# Patient Record
Sex: Female | Born: 1945 | Smoking: Former smoker
Health system: Northeastern US, Community
[De-identification: ages and names within clinical notes are randomized; demographics above are authoritative.]

## PROBLEM LIST (undated history)

## (undated) DIAGNOSIS — D649 Anemia, unspecified: Secondary | ICD-10-CM

## (undated) DIAGNOSIS — M949 Disorder of cartilage, unspecified: Secondary | ICD-10-CM

## (undated) DIAGNOSIS — R040 Epistaxis: Secondary | ICD-10-CM

## (undated) DIAGNOSIS — N12 Tubulo-interstitial nephritis, not specified as acute or chronic: Secondary | ICD-10-CM

## (undated) DIAGNOSIS — E039 Hypothyroidism, unspecified: Secondary | ICD-10-CM

## (undated) DIAGNOSIS — M899 Disorder of bone, unspecified: Secondary | ICD-10-CM

## (undated) HISTORY — DX: Disorder of bone, unspecified: M89.9

## (undated) HISTORY — PX: LIG/TRNSXJ FALOPIAN TUBE CESAREAN DEL/ABDML SURG: REP204

## (undated) HISTORY — DX: Epistaxis: R04.0

## (undated) HISTORY — PX: APPENDECTOMY: GID334

## (undated) HISTORY — DX: Disorder of cartilage, unspecified: M94.9

## (undated) HISTORY — DX: Hypothyroidism, unspecified: E03.9

## (undated) HISTORY — DX: Anemia, unspecified: D64.9

## (undated) HISTORY — DX: Tubulo-interstitial nephritis, not specified as acute or chronic: N12

---

## 2000-05-29 ENCOUNTER — Ambulatory Visit: Payer: Self-pay | Admitting: Family Medicine

## 2000-05-29 LAB — COMPLETE BLOOD COUNT
HEMATOCRIT: 33.5 % — ABNORMAL LOW (ref 37.0–47.0)
HEMOGLOBIN: 11.3 g/dL — ABNORMAL LOW (ref 12.0–16.0)
MEAN CORP HGB CONC: 33.9 g/dL (ref 32.0–36.0)
MEAN CORPUSCULAR HGB: 25.7 pg — ABNORMAL LOW (ref 27.0–31.0)
MEAN CORPUSCULAR VOL: 75.8 fL — ABNORMAL LOW (ref 81.0–99.0)
RBC DISTRIBUTION WIDTH: 15 % — ABNORMAL HIGH (ref 11.5–14.3)
RED BLOOD CELL COUNT: 4.41 MIL/uL (ref 4.20–5.40)
WHITE BLOOD CELL COUNT: 7.1 10*3/uL (ref 4.8–10.8)

## 2000-05-29 LAB — RPR: RPR: NONREACTIVE

## 2000-05-29 LAB — IADNA NEISSERIA GONORRHOEAE DIRECT PROBE TQ: GENPROBE GC: NEGATIVE

## 2000-05-29 LAB — CHG CYTP SLIDES CERV/VAG MNL SCRN PHYSICIAN SUPV

## 2000-05-29 LAB — CHOLESTEROL SERUM/WHOLE BLOOD TOTAL: Cholesterol: 178 mg/dl (ref ?–200)

## 2000-05-29 LAB — CHG LIPOPROTEIN DIR MEAS HIGH DENSITY CHOLESTEROL: HIGH DENSITY LIPOPROTEIN: 55 mg/dl (ref 35–95)

## 2000-05-29 LAB — IADNA CHLAMYDIA TRACHOMATIS DIRECT PROBE TQ: GENPROBE CHLAMYDIA: NEGATIVE

## 2000-06-27 ENCOUNTER — Ambulatory Visit: Payer: Self-pay

## 2000-07-08 ENCOUNTER — Ambulatory Visit: Payer: Self-pay | Admitting: Ophthalmology

## 2000-07-11 ENCOUNTER — Ambulatory Visit (HOSPITAL_BASED_OUTPATIENT_CLINIC_OR_DEPARTMENT_OTHER): Payer: Self-pay

## 2000-07-21 ENCOUNTER — Encounter (HOSPITAL_BASED_OUTPATIENT_CLINIC_OR_DEPARTMENT_OTHER): Payer: Self-pay

## 2000-07-21 HISTORY — PX: PR ESOPHAGOSCOPY FLEXIBLE TRANSORAL DIAGNOSTIC: 43200

## 2000-09-22 ENCOUNTER — Encounter (HOSPITAL_BASED_OUTPATIENT_CLINIC_OR_DEPARTMENT_OTHER): Payer: Self-pay

## 2000-09-22 HISTORY — PX: PR COLONOSCOPY STOMA DX INDUDING COLLJ SPEC SPX: 44388

## 2000-09-22 HISTORY — PX: PR COLONOSCOPY STOMA DX INCLUDING COLLJ SPEC SPX: 44388

## 2000-12-02 ENCOUNTER — Other Ambulatory Visit (HOSPITAL_BASED_OUTPATIENT_CLINIC_OR_DEPARTMENT_OTHER): Payer: Self-pay | Admitting: Family Medicine

## 2000-12-02 LAB — MA SCREENING MAMMO BILATERAL WITH CAD

## 2004-08-27 ENCOUNTER — Ambulatory Visit: Payer: Self-pay | Admitting: Ophthalmology

## 2004-08-27 DIAGNOSIS — H5200 Hypermetropia, unspecified eye: Secondary | ICD-10-CM

## 2004-08-27 DIAGNOSIS — H169 Unspecified keratitis: Secondary | ICD-10-CM

## 2004-08-27 DIAGNOSIS — H179 Unspecified corneal scar and opacity: Secondary | ICD-10-CM

## 2004-08-27 DIAGNOSIS — H524 Presbyopia: Secondary | ICD-10-CM

## 2005-05-21 ENCOUNTER — Encounter (HOSPITAL_BASED_OUTPATIENT_CLINIC_OR_DEPARTMENT_OTHER): Payer: Medicaid Other

## 2005-07-09 ENCOUNTER — Encounter (HOSPITAL_BASED_OUTPATIENT_CLINIC_OR_DEPARTMENT_OTHER): Payer: Self-pay

## 2005-07-09 ENCOUNTER — Ambulatory Visit (HOSPITAL_BASED_OUTPATIENT_CLINIC_OR_DEPARTMENT_OTHER): Payer: BC Managed Care – PPO

## 2005-07-09 VITALS — BP 120/72 | HR 64 | Temp 97.7°F | Ht 65.35 in | Wt 125.0 lb

## 2005-07-09 DIAGNOSIS — R040 Epistaxis: Secondary | ICD-10-CM

## 2005-07-09 DIAGNOSIS — Z0000 Encounter for general adult medical examination without abnormal findings: Secondary | ICD-10-CM

## 2005-07-09 DIAGNOSIS — Z1231 Encounter for screening mammogram for malignant neoplasm of breast: Secondary | ICD-10-CM

## 2005-07-09 LAB — BLOOD COUNT COMPLETE AUTO&AUTO DIFRNTL WBC
BASOPHIL %: 0.3 % (ref 0–2)
EOSINOPHIL %: 2.3 % (ref 0–7)
HEMATOCRIT: 39.9 % (ref 37.0–47.0)
HEMOGLOBIN: 13.4 g/dL (ref 12.0–16.0)
LYMPHOCYTE %: 31.7 % (ref 12–39)
MEAN CORP HGB CONC: 33.6 g/dL (ref 32.0–36.0)
MEAN CORPUSCULAR HGB: 28.5 pg (ref 27.0–31.0)
MEAN CORPUSCULAR VOL: 84.9 fL (ref 81.0–99.0)
MEAN PLATELET VOLUME: 8.1 fL (ref 6.4–10.8)
MONOCYTE %: 6.4 % (ref 1–12)
NEUTROPHIL %: 59.3 % (ref 46–79)
PLATELET COUNT: 315 10*3/uL (ref 150–400)
RBC DISTRIBUTION WIDTH: 13.2 % (ref 11.5–14.3)
RED BLOOD CELL COUNT: 4.7 MIL/uL (ref 4.20–5.40)
WHITE BLOOD CELL COUNT: 7.9 10*3/uL (ref 4.8–10.8)

## 2005-07-09 LAB — CHG HEPATIC FUNCTION PANEL
ALANINE AMINOTRANSFERASE: 21 IU/L (ref 7–35)
ALBUMIN: 4.4 g/dl (ref 3.4–4.8)
ALKALINE PHOSPHATASE: 92 IU/L (ref 25–106)
ASPARTATE AMINOTRANSFERASE: 22 IU/L (ref 8–34)
BILIRUBIN DIRECT: 0.1 mg/dl (ref 0.0–0.2)
BILIRUBIN TOTAL: 0.8 mg/dl (ref 0.2–1.1)
TOTAL PROTEIN: 7.7 g/dl — ABNORMAL HIGH (ref 5.9–7.5)

## 2005-07-09 LAB — BASIC METABOLIC PANEL
BUN (UREA NITROGEN): 12 mg/dl (ref 6–20)
CALCIUM: 9.5 mg/dl (ref 8.6–10.0)
CARBON DIOXIDE: 27 mmol/L (ref 22–32)
CHLORIDE: 105 mmol/L (ref 101–111)
CREATININE: 0.8 mg/dl (ref 0.4–1.2)
Glucose Random: 98 mg/dl (ref 74–160)
POTASSIUM: 4 mmol/L (ref 3.5–5.1)
SODIUM: 139 mmol/L (ref 135–144)

## 2005-07-09 LAB — THYROID SCREEN TSH REFLEX FT4: THYROID SCREEN TSH REFLEX FT4: 11.53 u[IU]/mL — ABNORMAL HIGH (ref 0.34–5.60)

## 2005-07-09 LAB — CHG LIPOPROTEIN DIRECT MEASUREMENT LDL CHOLESTEROL: LOW DENSITY LIPOPROTEIN DIRECT: 135 mg/dl — ABNORMAL HIGH (ref 0–100)

## 2005-07-09 LAB — CHG LIPOPROTEIN DIR MEAS HIGH DENSITY CHOLESTEROL: HIGH DENSITY LIPOPROTEIN: 47 mg/dl (ref 35–85)

## 2005-07-09 LAB — CHOLESTEROL SERUM/WHOLE BLOOD TOTAL: Cholesterol: 207 mg/dl — ABNORMAL HIGH (ref 0–200)

## 2005-07-09 LAB — CYTOPATH, C/V, THIN LAYER

## 2005-07-09 LAB — CHG ASSAY OF FREE THYROXINE: FREE THYROXINE: 0.51 ng/dl — ABNORMAL LOW (ref 0.55–1.12)

## 2005-07-09 NOTE — Patient Instructions (Addendum)
PATIENT EDUCATION - Breast Cancer Screening      A little of your time can make a lifetime of difference. Get your preventive health tests.     Breast cancer is the second leading cause of cancer deaths in American women. There is no proven way to not get breast cancer. Along with yearly physical exams, mammography is the best way to find breast cancer and save lives.    Facts you should know about why mammography is so important:    ** 80 % of all breast cancer occurs in women with no family history  ** Age is the most important risk factor for developing breast cancer  ** One time is not enough.   ** Starting at age 58, you need to get a mammogram every year    Please call my office to schedule a mammogram or your first visit. We can also schedule a routine Pap smear for cervical cancer screening. We need to know if you have already scheduled any tests with an OB/GYN. Please tell us so we can update your record.     Remember:  Finding breast cancer early = increased chances for successful treatment.    Remember, early detection greatly increases the chances for successful treatment.    Calcium supplement-see sheet    Baby Aspirin 81 mg a day

## 2005-07-09 NOTE — Progress Notes (Signed)
Beth Hudson is a 59 year old female  There is no problem list on file for this patient.    No current outpatient prescriptions on file.      Allergies not on file.    Past Medical History:   PYELONEPHRITIS NOS    Comment: None for 28 yrs.    Past Surgical History:   LIGATE OVIDUCT(S) ADD-ON    APPENDECTOMY     Review of patient's family history indicates:   Heart Mother    Hypertension Mother    Renal Mother    Comment: died 2, died of renal failure   Stroke Sister    Comment: age 76, now 57   Stroke Father    Comment: died age 69      Social History   Marital Status: Unknown Spouse Name:    Years of Education: Number of children:     Occupational History   None on file    Social History Main Topics   Tobacco Use: Never    Alcohol Use: No    Drug Use: No    Sexual Activity: Not Currently    Other Topics Concern   None on file    Social History Narrative   Live with ex-husband with end stage Lung Dz. And son age 80. She has 3 daughters ages 40,34,32. She works at [employer].      History of Present illness:  Patient here for Check up, last exam was 2001 incl. MAM, PAP!!  She's here at her daughters minsistence, admits she doesn't like doctors. Her only concerns are the right sided nosebleeds for the past 3 weeks, blood clots at times. She was told before that she had sinus problems. She denies nasal or head congestion, poor smell, nasal purulent drainage, allergies. No gum bleeding, easy bruising, anorexia, wt. Loss. No hematuria, melena, rectal bleeding.    ROS:  General- No change in weight or appetite, fever.  Eyes/ear/throat- No visual problems, hx. Of double vision, earache, hearing problem, allergies, sore throat.  Lungs- No cough, SOB, wheezing.   Heart- No CP, palpitations, orthopnea, PND.  Abdomen- No pain, nausea, heartburn, constipation, diarrheas, rectal bleeding.  Genitourinary- No dysuria, urinary frequency, hematuria, incontinence.  Neuro- No weakness or numbness of extremities, headaches,  difficulty speaking.  Musculoskeletal- No joint pains, swelling, back pain.  Skin- No lesions, rashes.  Psychiatric- No depression, anxiety sxs., difficulty sleeping.  Gynecological- Menopausal; no PMB, vaginal dryness or discharge. Not sexually active since 1997.    BP 120/72  Pulse 64  Temp (Src) 97.7 (Oral)  Ht 5' 5.35" (1.66m)  Wt 125 lbs (56.7kg)  Appearance: in no apparent distress and well developed and well nourished.  HEENT- Clear conjunctiva and sclera. PERRLA. Ears- Normal ext. Canal with small bilat. Serous effussions. Throat- Unremarkable  The neck is supple and free of adenopathy or masses, the thyroid is normal without enlargement or nodules.  S1 and S2 normal, no murmurs, clicks, gallops or rubs. Regular rate and rhythm. Chest is clear; no wheezes or rales. No edema of the legs.  The abdomen is soft without tenderness, guarding, mass, rebound or organomegaly. Bowel sounds are normal. No CVA tenderness or inguinal adenopathy noted.  GYN- External genitalia- normal.   Vagina- No lesions, discharge seen. Atrophic mucosa.   Cervix- Posterior. No lesions seen; no CMT.   Bimanual- No tenderness, masses felt.  Rectum normal without masses, negative guaiac.  Neuro: Gait is normal. Good strength in all extremities with normal muscle tone.  Cranial nerves 2- 12 intact.        V70.0 ROUTINE MEDICAL EXAM (primary encounter diagnosis)  Will also recommend Bone density.    V76.12 SCREENING MAMM-MAILG NEOPL NEC  Mam to be booked today.  Plan: ROUTINE VENIPUNCTURE, BASIC METABOLIC PANEL,    ASSAY, BLD/SERUM CHOLESTEROL, HIGH DENSITY    LIPOPROTEIN (HDL), LOW DENSITY LIPOPROTEIN    (LDL), COMPLETE CBC W/AUTO DIFF WBC, THYROID    SCREEN TSH, HEPATIC FUNCTION PANEL       784.7 EPISTAXIS  Will refer for ? Endoscopic eval./ENT, ? Nasal Polyp, other lesions.  Plan: REFERRAL TO ENT (INT)

## 2005-07-12 ENCOUNTER — Telehealth (HOSPITAL_BASED_OUTPATIENT_CLINIC_OR_DEPARTMENT_OTHER): Payer: Self-pay

## 2005-07-12 DIAGNOSIS — E039 Hypothyroidism, unspecified: Secondary | ICD-10-CM

## 2005-07-12 MED ORDER — LEVOXYL 50 MCG PO TABS
ORAL_TABLET | ORAL | Status: DC
Start: 2005-07-12 — End: 2005-10-07

## 2005-07-12 NOTE — Telephone Encounter (Signed)
I called the patient at home and left a message with her husband to call back. She has an elevated TSH, 11.53, which is compatible with hypothyroidism. Will await her call back. A rx. For Levoxyl .05mg  a day will be given , and, when she calls back it can be faxed over to the pharmacy of her choice. She should have repeat lab, thyroid testing in 2 months and F/u Appt.afterwards with me.  mitral regurgitation

## 2005-07-15 ENCOUNTER — Other Ambulatory Visit (HOSPITAL_BASED_OUTPATIENT_CLINIC_OR_DEPARTMENT_OTHER): Payer: Self-pay

## 2005-07-15 DIAGNOSIS — Z1231 Encounter for screening mammogram for malignant neoplasm of breast: Secondary | ICD-10-CM

## 2005-07-22 ENCOUNTER — Other Ambulatory Visit (HOSPITAL_BASED_OUTPATIENT_CLINIC_OR_DEPARTMENT_OTHER): Payer: Self-pay | Admitting: Rheumatology

## 2005-07-22 DIAGNOSIS — M949 Disorder of cartilage, unspecified: Secondary | ICD-10-CM

## 2005-07-22 DIAGNOSIS — M899 Disorder of bone, unspecified: Secondary | ICD-10-CM

## 2005-07-22 DIAGNOSIS — N951 Menopausal and female climacteric states: Secondary | ICD-10-CM

## 2005-07-25 NOTE — Telephone Encounter (Signed)
T.C. To pt and informed her of the lab results. Also called in script.

## 2005-07-29 LAB — LS&HIP BONE DENSITOMETRY SCAN

## 2005-07-31 ENCOUNTER — Encounter (HOSPITAL_BASED_OUTPATIENT_CLINIC_OR_DEPARTMENT_OTHER): Payer: Self-pay

## 2005-08-05 LAB — MA SCREENING MAMMO BILATERAL WITH CAD

## 2005-08-09 ENCOUNTER — Other Ambulatory Visit (HOSPITAL_BASED_OUTPATIENT_CLINIC_OR_DEPARTMENT_OTHER): Payer: BC Managed Care – PPO | Admitting: Otolaryngology

## 2005-08-09 DIAGNOSIS — J342 Deviated nasal septum: Secondary | ICD-10-CM

## 2005-08-09 DIAGNOSIS — R040 Epistaxis: Secondary | ICD-10-CM

## 2005-08-15 ENCOUNTER — Emergency Department (HOSPITAL_BASED_OUTPATIENT_CLINIC_OR_DEPARTMENT_OTHER): Payer: Self-pay | Admitting: Emergency Medicine

## 2005-08-19 ENCOUNTER — Ambulatory Visit (HOSPITAL_BASED_OUTPATIENT_CLINIC_OR_DEPARTMENT_OTHER): Payer: BC Managed Care – PPO | Admitting: Otolaryngology

## 2005-08-19 DIAGNOSIS — J342 Deviated nasal septum: Secondary | ICD-10-CM

## 2005-08-19 DIAGNOSIS — R040 Epistaxis: Secondary | ICD-10-CM

## 2005-09-17 ENCOUNTER — Ambulatory Visit (HOSPITAL_BASED_OUTPATIENT_CLINIC_OR_DEPARTMENT_OTHER): Payer: BC Managed Care – PPO

## 2005-09-17 VITALS — BP 100/60 | Temp 97.9°F | Ht 65.0 in | Wt 124.0 lb

## 2005-09-17 DIAGNOSIS — M899 Disorder of bone, unspecified: Secondary | ICD-10-CM | POA: Insufficient documentation

## 2005-09-17 DIAGNOSIS — M949 Disorder of cartilage, unspecified: Secondary | ICD-10-CM

## 2005-09-17 DIAGNOSIS — G5600 Carpal tunnel syndrome, unspecified upper limb: Secondary | ICD-10-CM

## 2005-09-17 DIAGNOSIS — R040 Epistaxis: Secondary | ICD-10-CM | POA: Insufficient documentation

## 2005-09-17 DIAGNOSIS — E039 Hypothyroidism, unspecified: Secondary | ICD-10-CM

## 2005-09-17 DIAGNOSIS — D649 Anemia, unspecified: Secondary | ICD-10-CM

## 2005-09-17 HISTORY — DX: Epistaxis: R04.0

## 2005-09-17 HISTORY — DX: Disorder of bone, unspecified: M89.9

## 2005-09-17 MED ORDER — CALTRATE 600 + D 600-200 MG-IU OR TABS
ORAL_TABLET | ORAL | Status: DC
Start: 2005-09-17 — End: 2007-08-10

## 2005-09-17 MED ORDER — FOSAMAX 70 MG PO TABS
ORAL_TABLET | ORAL | Status: DC
Start: 2005-09-17 — End: 2006-07-09

## 2005-09-17 MED ORDER — FERROUS SULFATE 325 MG OR CAPS
ORAL_CAPSULE | ORAL | Status: DC
Start: 2005-09-17 — End: 2006-04-07

## 2005-09-17 NOTE — Progress Notes (Signed)
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 Was seen three times in the er for Recurrent Nosebleeds, has had cauterization done x 3 , last time at [facility] with resolution of sxs.Marland Kitchen No prior similar sxs.; also told she had anemia, rec. Iron tx..  No hx. Gum bleeding, easy bruising, fatigue, CP, SOB.  She also recently has noted left forearm and hand numbness, paresthesias; her hand has awakened asleep in the AM. No hx. Neck pain, similar sxs. In other extremities.  She's been taking Levoxyl without any problems.  Bone Density Results from 08/06 have been reviewed    BP 100/60  Temp (Src) 97.9 (Oral)  Ht 5\' 5"  (1.50m)  Wt 124 lbs (56.2kg)  Appearance: in no apparent distress and well developed, thin female  The neck is supple and free of adenopathy or masses, the thyroid is normal without enlargement or nodules.  S1 and S2 normal, no murmurs, clicks, gallops or rubs. Regular rate and rhythm. Chest is clear; no wheezes or rales. No edema of legs.  Exam reveals positive Phalen and Tinel sign on left wrist. There is no muscle atrophy noted.  Neuro: Gait is normal. Good strength in all extremities with normal muscle tone.    244.9 HYPOTHYROIDISM NOS (primary encounter diagnosis)  Recheck TSH to determine further meds adjustment.  Plan: ROUTINE VENIPUNCTURE, THYROID STIM HORMONE     285.9 ANEMIA NOS  Re-check and f/u.  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 This drug must be taken upon arising for the day on an empty stomach, with a large 6-8 ounce glass of water; she must remain NPO in the upright position for at least 30 minutes afterwards and until after the first food of the day. If esophageal irritation is noted, she will stop the drug and call my office.    Plan: FOSAMAX 70 MG OR TABS, CALTRATE 600 + D 600-200   MG-IU OR TABS       784.7 EPISTAXIS  She has ENT f/u, reccomended Humidifier use, Ocean nasal spray.    354.0 CARPAL TUNNEL SYNDROME  Use of splint at bedtime reccomended.      09/17/2005  VIS given prior to administration and reviewed with the patient and or legal guardian. Patient understands the disease and the vaccine. See immunization/Injection module or chart review for date of publication and additional information.  Elson Clan, MD

## 2005-09-22 ENCOUNTER — Telehealth (HOSPITAL_BASED_OUTPATIENT_CLINIC_OR_DEPARTMENT_OTHER): Payer: Self-pay

## 2005-09-22 DIAGNOSIS — E039 Hypothyroidism, unspecified: Secondary | ICD-10-CM

## 2005-09-22 DIAGNOSIS — D649 Anemia, unspecified: Secondary | ICD-10-CM

## 2005-09-22 DIAGNOSIS — G5600 Carpal tunnel syndrome, unspecified upper limb: Secondary | ICD-10-CM | POA: Insufficient documentation

## 2005-09-22 HISTORY — DX: Hypothyroidism, unspecified: E03.9

## 2005-09-22 HISTORY — DX: Anemia, unspecified: D64.9

## 2005-09-22 LAB — BLOOD COUNT COMPLETE AUTOMATED
HEMATOCRIT: 24.7 % — ABNORMAL LOW (ref 37.0–47.0)
HEMOGLOBIN: 8.1 g/dL — CL (ref 12.0–16.0)
MEAN CORP HGB CONC: 32.9 g/dL (ref 32.0–36.0)
MEAN CORPUSCULAR HGB: 26.2 pg — ABNORMAL LOW (ref 27.0–31.0)
MEAN CORPUSCULAR VOL: 79.8 fL — ABNORMAL LOW (ref 81.0–99.0)
MEAN PLATELET VOLUME: 7.4 fL (ref 6.4–10.8)
PLATELET COUNT: 491 10*3/uL — ABNORMAL HIGH (ref 150–400)
RBC DISTRIBUTION WIDTH: 14.2 % (ref 11.5–14.3)
RED BLOOD CELL COUNT: 3.1 MIL/uL — ABNORMAL LOW (ref 4.20–5.40)
WHITE BLOOD CELL COUNT: 6.3 10*3/uL (ref 4.8–10.8)

## 2005-09-22 LAB — IRON: IRON: 129 ug/dl (ref 28–170)

## 2005-09-22 LAB — TSH (THYROID STIMULATING HORMONE): TSH (THYROID STIM HORMONE): 4.4 u[IU]/mL (ref 0.34–5.60)

## 2005-09-22 NOTE — Telephone Encounter (Signed)
I called the patient at home to let her know, regarding severe anemia as per last lab done on 09/17/05. I left a message stating that she needed to return to the office tomorrow AM for repeat lab.. I also called the patient's next of kin, her daughter Leandro Reasoner and again reinforced that she needs to return on Monday 09/23/05 for repeat lab testing.  Elson Clan, MD

## 2005-09-23 ENCOUNTER — Other Ambulatory Visit (HOSPITAL_BASED_OUTPATIENT_CLINIC_OR_DEPARTMENT_OTHER): Payer: BLUE CROSS/BLUE SHIELD | Admitting: Lab

## 2005-09-23 DIAGNOSIS — D649 Anemia, unspecified: Secondary | ICD-10-CM

## 2005-09-23 LAB — BLOOD COUNT COMPLETE AUTOMATED
HEMATOCRIT: 23.2 % — ABNORMAL LOW (ref 37.0–47.0)
HEMOGLOBIN: 7.5 g/dL — CL (ref 12.0–16.0)
MEAN CORP HGB CONC: 32.3 g/dL (ref 32.0–36.0)
MEAN CORPUSCULAR HGB: 25.3 pg — ABNORMAL LOW (ref 27.0–31.0)
MEAN CORPUSCULAR VOL: 78.4 fL — ABNORMAL LOW (ref 81.0–99.0)
MEAN PLATELET VOLUME: 7.8 fL (ref 6.4–10.8)
PLATELET COUNT: 408 10*3/uL — ABNORMAL HIGH (ref 150–400)
RBC DISTRIBUTION WIDTH: 14.9 % — ABNORMAL HIGH (ref 11.5–14.3)
RED BLOOD CELL COUNT: 2.96 MIL/uL — CL (ref 4.20–5.40)
WHITE BLOOD CELL COUNT: 5.4 10*3/uL (ref 4.8–10.8)

## 2005-09-23 NOTE — Nursing Note (Signed)
>>   Beth Hudson, Beth Hudson     09/23/2005   9:37 am  Labs completed

## 2005-09-24 ENCOUNTER — Telehealth (HOSPITAL_BASED_OUTPATIENT_CLINIC_OR_DEPARTMENT_OTHER): Payer: Self-pay

## 2005-09-24 NOTE — Telephone Encounter (Signed)
I called the patent's houseand spoke with her husband. He referred that she was in [place], didn't know the tel# she's at. He gave me another's daughter's tel. #, [phone], [phone]. I called there and left a message to call back. He f/u Hct is 23.4%, she needs to receive a blood transfusion.  Elson Clan, MD

## 2005-09-25 ENCOUNTER — Encounter (HOSPITAL_BASED_OUTPATIENT_CLINIC_OR_DEPARTMENT_OTHER): Payer: Self-pay | Admitting: Registered Nurse

## 2005-09-25 ENCOUNTER — Encounter (HOSPITAL_BASED_OUTPATIENT_CLINIC_OR_DEPARTMENT_OTHER): Payer: BLUE CROSS/BLUE SHIELD | Admitting: Infusion

## 2005-09-25 DIAGNOSIS — D6489 Other specified anemias: Secondary | ICD-10-CM

## 2005-09-25 LAB — PATIENT ABO/RH CONFIRM

## 2005-09-25 LAB — COMPATIBILITY TEST, IS/GEL - NO LONGER IN USE

## 2005-09-25 LAB — PACKED RED BLOOD CELLS

## 2005-09-25 LAB — TYPE AND SCREEN

## 2005-09-25 NOTE — Progress Notes (Signed)
Pt present to clinic for 2 units of PRBC's secondary to hct 23.2 by Dr. Delora Fuel. Type and screened. Consented by Dr. Delora Fuel (written consent in chart). VSS - see chart for transfusion form documentation. Post-transfusion information given to patient - verbal and written. F/U with Dr. Delora Fuel 09/26/05 for repeat (post H&H). Pt aware. Tolerated transfusion without reaction.

## 2005-09-25 NOTE — Telephone Encounter (Signed)
I was able to communicate with her daughter Leandro Reasoner. Blood transfusion has been arranged for tomorrow at [facility], patient to f/u with me in 1 week.  Elson Clan, MD  .

## 2005-09-26 ENCOUNTER — Other Ambulatory Visit (HOSPITAL_BASED_OUTPATIENT_CLINIC_OR_DEPARTMENT_OTHER): Payer: BLUE CROSS/BLUE SHIELD | Admitting: Lab

## 2005-09-26 DIAGNOSIS — E039 Hypothyroidism, unspecified: Secondary | ICD-10-CM

## 2005-09-26 DIAGNOSIS — D649 Anemia, unspecified: Secondary | ICD-10-CM

## 2005-09-26 LAB — CHG ASSAY OF FREE THYROXINE: FREE THYROXINE: 0.74 ng/dl (ref 0.55–1.12)

## 2005-09-26 LAB — BLOOD COUNT COMPLETE AUTO&AUTO DIFRNTL WBC
BASOPHIL %: 0.4 % (ref 0–2)
EOSINOPHIL %: 2 % (ref 0–7)
HEMATOCRIT: 32.4 % — ABNORMAL LOW (ref 37.0–47.0)
HEMOGLOBIN: 11 g/dL — ABNORMAL LOW (ref 12.0–16.0)
LYMPHOCYTE %: 31.7 % (ref 12–39)
MEAN CORP HGB CONC: 33.9 g/dL (ref 32.0–36.0)
MEAN CORPUSCULAR HGB: 26.9 pg — ABNORMAL LOW (ref 27.0–31.0)
MEAN CORPUSCULAR VOL: 79.2 fL — ABNORMAL LOW (ref 81.0–99.0)
MEAN PLATELET VOLUME: 7.9 fL (ref 6.4–10.8)
MONOCYTE %: 7.2 % (ref 1–12)
NEUTROPHIL %: 58.7 % (ref 46–79)
PLATELET COUNT: 389 10*3/uL (ref 150–400)
RBC DISTRIBUTION WIDTH: 15.4 % — ABNORMAL HIGH (ref 11.5–14.3)
RED BLOOD CELL COUNT: 4.09 MIL/uL — ABNORMAL LOW (ref 4.20–5.40)
WHITE BLOOD CELL COUNT: 6.1 10*3/uL (ref 4.8–10.8)

## 2005-09-26 LAB — THYROID SCREEN TSH REFLEX FT4: THYROID SCREEN TSH REFLEX FT4: 6.77 u[IU]/mL — ABNORMAL HIGH (ref 0.34–5.60)

## 2005-09-26 NOTE — Nursing Note (Signed)
>>   Beth Hudson     09/26/2005   11:26 am  Lab completed

## 2005-10-07 ENCOUNTER — Ambulatory Visit (HOSPITAL_BASED_OUTPATIENT_CLINIC_OR_DEPARTMENT_OTHER): Payer: BLUE CROSS/BLUE SHIELD

## 2005-10-07 VITALS — BP 100/60 | Temp 98.5°F | Wt 121.0 lb

## 2005-10-07 DIAGNOSIS — E039 Hypothyroidism, unspecified: Secondary | ICD-10-CM

## 2005-10-07 DIAGNOSIS — D649 Anemia, unspecified: Secondary | ICD-10-CM

## 2005-10-07 LAB — BLOOD COUNT COMPLETE AUTOMATED
HEMATOCRIT: 34.6 % — ABNORMAL LOW (ref 37.0–47.0)
HEMOGLOBIN: 11.5 g/dL — ABNORMAL LOW (ref 12.0–16.0)
MEAN CORP HGB CONC: 33.3 g/dL (ref 32.0–36.0)
MEAN CORPUSCULAR HGB: 26.6 pg — ABNORMAL LOW (ref 27.0–31.0)
MEAN CORPUSCULAR VOL: 79.8 fL — ABNORMAL LOW (ref 81.0–99.0)
MEAN PLATELET VOLUME: 8.6 fL (ref 6.4–10.8)
PLATELET COUNT: 278 10*3/uL (ref 150–400)
RBC DISTRIBUTION WIDTH: 17.2 % — ABNORMAL HIGH (ref 11.5–14.3)
RED BLOOD CELL COUNT: 4.33 MIL/uL (ref 4.20–5.40)
WHITE BLOOD CELL COUNT: 6.4 10*3/uL (ref 4.8–10.8)

## 2005-10-07 MED ORDER — LEVOXYL 75 MCG PO TABS
ORAL_TABLET | ORAL | Status: DC
Start: 2005-10-07 — End: 2005-12-05

## 2005-10-08 NOTE — Progress Notes (Signed)
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    Patient here for f/u. Aprox. 2 weeks ago received 2 Units PRBC's due to severe anemia 2 ry to Severe Recurrent Epistaxis. She was re-eval. By ENT, apparently all ok but no notes available for review. She has not had any further nosebleeds. She's pending stool guaiac check; had Colo around 5 years ago and apparently all wnl. No easy bruising, gum bleeds, hematuria, melena( although now stools are dark with iron tx.) She feels good; denies fatigue. Taking all meds as rx. Without any side effects.    BP 100/60  Temp (Src) 98.5 (Oral)  Wt 121 lbs (54.9kg)  Appearance: in no apparent distress and well developed and well nourished.  S1 and S2 normal, no murmurs, clicks, gallops or rubs. Regular rate and rhythm. Chest is clear; no wheezes or rales.     Labs:   Component Reference Range 09/26/2005  WHITE BLOOD CELL AUTO 4.8-10.8 TH/uL 6.1  RED BLOOD CELL COUNT AUTO 4.20-5.40 MIL/uL 4.09 (L)  HEMOGLOBIN AUTO 12.0-16.0 g/dL 16.1 (L)  HEMATOCRIT AUTO 37.0-47.0 % 32.4 (L)  MEAN CORPUSCULAR VOL AUTO 81.0-99.0 fL 79.2 (L)  MEAN CORPUSCULAR HGB AUTO 27.0-31.0 pg 26.9 (L)  MEAN CORP HGB CONC AUTO 32.0-36.0 g/dL 09.6  RBC DISTRIBUTION WIDTH 11.5-14.3 % 15.4 (H)  PLATELET COUNT AUTO 150-400 TH/MM3 389  MEAN PLATELET VOLUME AUTO 6.4-10.8 fL 7.9  NEUTROPHIL % 46-79 % 58.7  LYMPHOCYTE % 12-39 % 31.7  MONOCYTE % 1-12 %  7.2  EOSINOPHIL % 0-7 % 2.0  BASOPHIL % 0-2 % 0.4  THYROID SCREEN TSH 0.34-5.60 uIU/ml 6.77 (H)    285.9 ANEMIA NOS (primary encounter diagnosis)  Recheck today and in 1 month; cont. Iron supplements.  Plan: ROUTINE VENIPUNCTURE, COMPLETE CBC, AUTOMATED,    ROUTINE VENIPUNCTURE, COMPLETE CBC, AUTOMATED       244.9 HYPOTHYROIDISM NOS  Increase Levoxyl dose and f/u.  Plan: LEVOXYL 75 MCG OR TABS, THYROID STIM HORMONE,    ROUTINE VENIPUNCTURE

## 2005-11-06 ENCOUNTER — Other Ambulatory Visit (HOSPITAL_BASED_OUTPATIENT_CLINIC_OR_DEPARTMENT_OTHER): Payer: BLUE CROSS/BLUE SHIELD | Admitting: Lab

## 2005-11-06 DIAGNOSIS — D649 Anemia, unspecified: Secondary | ICD-10-CM

## 2005-11-06 DIAGNOSIS — E039 Hypothyroidism, unspecified: Secondary | ICD-10-CM

## 2005-11-06 LAB — BLOOD COUNT COMPLETE AUTOMATED
HEMATOCRIT: 39.5 % (ref 37.0–47.0)
HEMOGLOBIN: 12.7 g/dL (ref 12.0–16.0)
MEAN CORP HGB CONC: 32.2 g/dL (ref 32.0–36.0)
MEAN CORPUSCULAR HGB: 25.6 pg — ABNORMAL LOW (ref 27.0–31.0)
MEAN CORPUSCULAR VOL: 79.6 fL — ABNORMAL LOW (ref 81.0–99.0)
MEAN PLATELET VOLUME: 8.2 fL (ref 6.4–10.8)
PLATELET COUNT: 279 10*3/uL (ref 150–400)
RBC DISTRIBUTION WIDTH: 18.7 % — ABNORMAL HIGH (ref 11.5–14.3)
RED BLOOD CELL COUNT: 4.97 MIL/uL (ref 4.20–5.40)
WHITE BLOOD CELL COUNT: 5.7 10*3/uL (ref 4.8–10.8)

## 2005-11-06 LAB — TSH (THYROID STIMULATING HORMONE): TSH (THYROID STIM HORMONE): 5.1 u[IU]/mL (ref 0.34–5.60)

## 2005-11-06 NOTE — Nursing Note (Signed)
>>   RAIS, LISA     11/06/2005   4:10 pm  Lab completed  lr

## 2005-11-14 ENCOUNTER — Encounter (HOSPITAL_BASED_OUTPATIENT_CLINIC_OR_DEPARTMENT_OTHER): Payer: Self-pay

## 2005-12-05 ENCOUNTER — Ambulatory Visit (HOSPITAL_BASED_OUTPATIENT_CLINIC_OR_DEPARTMENT_OTHER): Payer: BLUE CROSS/BLUE SHIELD

## 2005-12-05 VITALS — BP 120/60 | Temp 97.9°F | Wt 120.0 lb

## 2005-12-05 DIAGNOSIS — D509 Iron deficiency anemia, unspecified: Secondary | ICD-10-CM

## 2005-12-05 DIAGNOSIS — H538 Other visual disturbances: Secondary | ICD-10-CM

## 2005-12-05 DIAGNOSIS — E039 Hypothyroidism, unspecified: Secondary | ICD-10-CM

## 2005-12-05 MED ORDER — LEVOXYL 100 MCG PO TABS
ORAL_TABLET | ORAL | Status: DC
Start: 2005-12-05 — End: 2006-04-08

## 2005-12-09 NOTE — Progress Notes (Signed)
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 [name]; denies fatigue. No further nosebleeds. She's cont. Taking iron pills as rx.Beth Hudson Fosamax without any side effects.    Appearance: in no apparent distress and well developed and well nourished.  The neck is supple and free of adenopathy or masses, the thyroid is normal without enlargement or nodules.  S1 and S2 normal, no murmurs, clicks, gallops or rubs. Regular rate and rhythm. Chest is clear; no wheezes or rales. No edema      Component Reference Range 11/06/2005  WHITE BLOOD CELL AUTO 4.8-10.8 TH/uL 5.7  RED BLOOD CELL COUNT AUTO 4.20-5.40 MIL/uL 4.97  HEMOGLOBIN AUTO 12.0-16.0 g/dL 16.1  HEMATOCRIT AUTO 09.6-04.5 % 39.5  MEAN CORPUSCULAR VOL AUTO 81.0-99.0 fL 79.6 (L)  MEAN CORPUSCULAR HGB AUTO 27.0-31.0 pg 25.6 (L)  MEAN CORP HGB CONC AUTO 32.0-36.0 g/dL 40.9  RBC DISTRIBUTION WIDTH 11.5-14.3 % 18.7 (H)  PLATELET COUNT AUTO 150-400 TH/MM3 279  MEAN PLATELET VOLUME AUTO 6.4-10.8 fL 8.2  THYROID STIM HORMONE 0.34-5.60 uIU/ml 5.10    244.9 HYPOTHYROIDISM NOS (primary encounter diagnosis)  I will increase Levoxyl dose to full replacement dose and re-check; TSH goal between 2-3.    368.8 VISUAL DISTURBANCES NEC  Referral for eye check, routine.  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 Iron tx.Marland Kitchen

## 2005-12-13 ENCOUNTER — Other Ambulatory Visit (HOSPITAL_BASED_OUTPATIENT_CLINIC_OR_DEPARTMENT_OTHER): Payer: BLUE CROSS/BLUE SHIELD | Admitting: Ophthalmology

## 2005-12-13 DIAGNOSIS — H524 Presbyopia: Secondary | ICD-10-CM

## 2005-12-13 DIAGNOSIS — H5200 Hypermetropia, unspecified eye: Secondary | ICD-10-CM

## 2006-01-07 ENCOUNTER — Other Ambulatory Visit (HOSPITAL_BASED_OUTPATIENT_CLINIC_OR_DEPARTMENT_OTHER): Payer: BLUE CROSS/BLUE SHIELD | Admitting: Lab

## 2006-01-07 DIAGNOSIS — D649 Anemia, unspecified: Secondary | ICD-10-CM

## 2006-01-07 DIAGNOSIS — E039 Hypothyroidism, unspecified: Secondary | ICD-10-CM

## 2006-01-07 LAB — BLOOD COUNT COMPLETE AUTO&AUTO DIFRNTL WBC
BASOPHIL %: 0.5 % (ref 0–2)
EOSINOPHIL %: 2.5 % (ref 0–7)
HEMATOCRIT: 38.5 % (ref 37.0–47.0)
HEMOGLOBIN: 13 g/dL (ref 12.0–16.0)
LYMPHOCYTE %: 26.4 % (ref 12–39)
MEAN CORP HGB CONC: 33.9 g/dL (ref 32.0–36.0)
MEAN CORPUSCULAR HGB: 27.5 pg (ref 27.0–31.0)
MEAN CORPUSCULAR VOL: 81 fL (ref 81.0–99.0)
MEAN PLATELET VOLUME: 8.3 fL (ref 6.4–10.8)
MONOCYTE %: 7.1 % (ref 1–12)
NEUTROPHIL %: 63.5 % (ref 46–79)
PLATELET COUNT: 301 10*3/uL (ref 150–400)
RBC DISTRIBUTION WIDTH: 16.8 % — ABNORMAL HIGH (ref 11.5–14.3)
RED BLOOD CELL COUNT: 4.75 MIL/uL (ref 4.20–5.40)
WHITE BLOOD CELL COUNT: 5.9 10*3/uL (ref 4.8–10.8)

## 2006-01-07 LAB — TSH (THYROID STIMULATING HORMONE): TSH (THYROID STIM HORMONE): 7.73 u[IU]/mL — ABNORMAL HIGH (ref 0.34–5.60)

## 2006-01-07 NOTE — Nursing Note (Signed)
>>   Beth Hudson     01/07/2006   9:39 am  Labs drawn.

## 2006-01-14 ENCOUNTER — Encounter (HOSPITAL_BASED_OUTPATIENT_CLINIC_OR_DEPARTMENT_OTHER): Payer: Self-pay

## 2006-01-14 ENCOUNTER — Other Ambulatory Visit (HOSPITAL_BASED_OUTPATIENT_CLINIC_OR_DEPARTMENT_OTHER): Payer: Self-pay

## 2006-01-14 DIAGNOSIS — E039 Hypothyroidism, unspecified: Secondary | ICD-10-CM

## 2006-01-14 MED ORDER — LEVOXYL 112 MCG PO TABS
ORAL_TABLET | ORAL | Status: DC
Start: 2006-01-14 — End: 2006-04-08

## 2006-02-16 ENCOUNTER — Emergency Department: Payer: Self-pay | Admitting: Emergency Medicine

## 2006-02-17 ENCOUNTER — Telehealth (HOSPITAL_BASED_OUTPATIENT_CLINIC_OR_DEPARTMENT_OTHER): Payer: Self-pay | Admitting: Registered Nurse

## 2006-02-17 ENCOUNTER — Ambulatory Visit (HOSPITAL_BASED_OUTPATIENT_CLINIC_OR_DEPARTMENT_OTHER): Payer: BLUE CROSS/BLUE SHIELD

## 2006-02-17 VITALS — BP 120/60 | HR 62 | Temp 98.5°F

## 2006-02-17 DIAGNOSIS — B0229 Other postherpetic nervous system involvement: Secondary | ICD-10-CM

## 2006-02-17 DIAGNOSIS — B029 Zoster without complications: Secondary | ICD-10-CM

## 2006-02-17 NOTE — Progress Notes (Signed)
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]    Current outpatient prescriptions:  CENTRUM SILVER OR TABS, 1 TABLET DAILY, Disp: 30, Rfl: 0  FERROUS SULFATE 325 MG OR CAPS, 1 TABLET twice DAILY, Disp: 100, Rfl: 5  FOSAMAX 70 MG OR TABS, 1 TABLET WEEKLY ON AN EMPTY STOMACH, Disp: 4, Rfl: 11  CALTRATE 600 + D 600-200 MG-IU OR TABS, 1 TABLET twice daily, Disp: 60, Rfl: 11  ZOVIRAX 800 MG OR TABS, 1 TABLET 5 TIMES DAILY- per ER, Disp: 50, Rfl: 0  OXYCODONE-ACETAMINOPHEN 5-300 MG OR TABS, 1 TABLET EVERY 6 HOURS AS NEEDED- per ER, Disp: 56, Rfl: 0  LEVOXYL 112 MCG OR TABS, 1 TABLET DAILY, Disp: 30, Rfl: 6    Patient For 5 days with rash in her back, seen in ER last night due to severe pain since the night before. She was dx. With shingles, no prior similar sxs.. She's had pain relief with med rx.. This AM noted lump in left rib cage area very tender. No fever, CP, SOB, pleuresy.    BP 120/60  Pulse 62  Temp (Src) 98.5 (Oral)  SaO2 100%  Appearance: in no apparent distress and well developed and well nourished.  Skin- In left back, ? T 6 dermatome with patchy papulo- vesicular rash with erythema, in clusters; there's a small area in left ant. Chest, similar appearance within same dermatome distribution. Left back and ribs with increased sensitivity to light touch but no masses, edema present.  S1 and S2 normal, no murmurs, clicks, gallops or rubs. Regular rate and rhythm. Chest is clear; no wheezes or rales.    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 Related to Zoster virus reactivation. If needed will call for pain med refills.  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

## 2006-02-17 NOTE — Telephone Encounter (Signed)
T.C. To pt. She states that she developed shingles over the w/e. Went to the ER. This AM she woke up with a lump near her left breast. Painful to touch. Add-on for Dr. Delora Fuel today.

## 2006-02-17 NOTE — Telephone Encounter (Signed)
Staff Message copied by Barbarann Ehlers on 02/17/2006 at 9:07 AM  ------   Message from: Jeanella Cara   Created: 02/17/2006 at 8:48 AM   Contact: [phone]    Beth Hudson [id number], 60 year old, female, [phone] (home) [phone] (work)    Cleotis Lema NUMBER: [phone]  Cell phone:   Other phone:    Available times:    Patient's language of care: English    Patient does not need an interpreter.    Patient's PCP: Elson Clan, MD, MD    Person calling on behalf of patient: patient (self)    Calls today with a sick call. Patient has shingles, woke up with lump on left side of ribs right behind of breast. Would like for Dr Delora Fuel to call. Thank you

## 2006-03-05 ENCOUNTER — Other Ambulatory Visit (HOSPITAL_BASED_OUTPATIENT_CLINIC_OR_DEPARTMENT_OTHER): Payer: BLUE CROSS/BLUE SHIELD | Admitting: Lab

## 2006-03-05 DIAGNOSIS — E039 Hypothyroidism, unspecified: Secondary | ICD-10-CM

## 2006-03-05 LAB — TSH (THYROID STIMULATING HORMONE): TSH (THYROID STIM HORMONE): 11.34 u[IU]/mL — ABNORMAL HIGH (ref 0.34–5.60)

## 2006-03-05 NOTE — Nursing Note (Signed)
>>   RAIS, LISA     03/05/2006   1:11 pm  Labs complete  LR

## 2006-03-13 ENCOUNTER — Telehealth (HOSPITAL_BASED_OUTPATIENT_CLINIC_OR_DEPARTMENT_OTHER): Payer: Self-pay

## 2006-03-13 DIAGNOSIS — E039 Hypothyroidism, unspecified: Secondary | ICD-10-CM

## 2006-03-13 MED ORDER — LEVOXYL 137 MCG PO TABS
ORAL_TABLET | ORAL | Status: DC
Start: 2006-03-13 — End: 2006-04-08

## 2006-03-13 NOTE — Telephone Encounter (Signed)
I called patient and left her a message to call me back. Reviewed TSH results and TSH is higher than in 02/07, 11.54. Patient currently on Levoxyl 112 mcg a day; will increase dose to 137 mcg a day. And repeat labs in 2 months. I will forward message to our nurses.  Elson Clan, MD

## 2006-04-07 ENCOUNTER — Ambulatory Visit (HOSPITAL_BASED_OUTPATIENT_CLINIC_OR_DEPARTMENT_OTHER): Payer: BLUE CROSS/BLUE SHIELD

## 2006-04-07 VITALS — BP 120/70 | Temp 97.1°F | Wt 127.0 lb

## 2006-04-07 DIAGNOSIS — E039 Hypothyroidism, unspecified: Secondary | ICD-10-CM

## 2006-04-07 DIAGNOSIS — D649 Anemia, unspecified: Secondary | ICD-10-CM

## 2006-04-07 LAB — BLOOD COUNT COMPLETE AUTOMATED
HEMATOCRIT: 36 % — ABNORMAL LOW (ref 37.0–47.0)
HEMOGLOBIN: 12.4 g/dL (ref 12.0–16.0)
MEAN CORP HGB CONC: 34.4 g/dL (ref 32.0–36.0)
MEAN CORPUSCULAR HGB: 29.2 pg (ref 27.0–31.0)
MEAN CORPUSCULAR VOL: 85 fL (ref 81.0–99.0)
MEAN PLATELET VOLUME: 7.9 fL (ref 6.4–10.8)
PLATELET COUNT: 308 10*3/uL (ref 150–400)
RBC DISTRIBUTION WIDTH: 12.9 % (ref 11.5–14.3)
RED BLOOD CELL COUNT: 4.24 MIL/uL (ref 4.20–5.40)
WHITE BLOOD CELL COUNT: 7 10*3/uL (ref 4.8–10.8)

## 2006-04-07 LAB — TSH (THYROID STIMULATING HORMONE): TSH (THYROID STIM HORMONE): 8.66 u[IU]/mL — ABNORMAL HIGH (ref 0.34–5.60)

## 2006-04-08 ENCOUNTER — Telehealth (HOSPITAL_BASED_OUTPATIENT_CLINIC_OR_DEPARTMENT_OTHER): Payer: Self-pay

## 2006-04-08 DIAGNOSIS — E039 Hypothyroidism, unspecified: Secondary | ICD-10-CM

## 2006-04-08 MED ORDER — SYNTHROID 150 MCG PO TABS
ORAL_TABLET | ORAL | Status: DC
Start: 2006-04-08 — End: 2006-07-09

## 2006-04-08 NOTE — Telephone Encounter (Signed)
I called the patient to inform her regarding lab results done yeaterday. She was not home and I left a message to call me back. Her TSH was a little elevated, 8.66, so I will need to increase her Synthroid( I will also rx. Brand name pill)from 137 to .150 mg a day. He CBC was normal, no anemia.  Elson Clan, MD

## 2006-04-08 NOTE — Telephone Encounter (Signed)
T.C. To pt and advised her of the results. Script as above called to the pharmacy.

## 2006-04-08 NOTE — Telephone Encounter (Signed)
Staff Message copied by Barbarann Ehlers on 04/08/2006 at 4:41 PM  ------   Message from: Nelta Numbers   Created: 04/08/2006 at 3:49 PM   Regarding: Calling for the nurse   Contact: [phone]    Marietta Sikkema [id number], 60 year old, female, [phone] (home) [phone] (work)    Cleotis Lema NUMBER: [phone]  Cell phone:   Other phone:    Available times:    Patient's language of care: English    Patient does not need an interpreter.    Patient's PCP: Elson Clan, MD, MD    Person calling on behalf of patient: patient (self)    Calls today with questions and concerns.The patient was told by Dr. Delora Fuel to call to speak with a nurse.  Thanks-mlo    Patient's Preferred Pharmacy:   No Pharmacies Listed

## 2006-04-08 NOTE — Progress Notes (Signed)
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 States that she's taking all meds as rx..  Shingles resolved, no pain in area mentioned; took meds as rx..  No further nose bleeds.  Denies fatigue, weight and appetite are good.    BP 120/70  Temp (Src) 97.1 (Oral)  Wt 127 lbs (57.6kg)  Appearance: in no apparent distress and well developed and well nourished.  Skin - In midscapular region with hyperpigmented area where rash was, otherwise unremarkable.  S1 and S2 normal, no murmurs, clicks, gallops or rubs. Regular rate and rhythm. Chest is clear; no wheezes or rales.    244.9 HYPOTHYROIDISM NOS (primary encounter diagnosis)  Re-check TSH; Her TSH test results have continued to increase in spite of adjusting dose upwards. Currently on .137 mg a day ( she'll verify dose, forgot to begin her rx. Bottles); ? Change to " brand name" med, Synthroid  Plan: ROUTINE VENIPUNCTURE, THYROID STIM HORMONE       285.9 ANEMIA NOS    Plan: COMPLETE CBC, AUTOMATED

## 2006-04-09 ENCOUNTER — Encounter (HOSPITAL_BASED_OUTPATIENT_CLINIC_OR_DEPARTMENT_OTHER): Payer: BLUE CROSS/BLUE SHIELD | Admitting: Registered Nurse

## 2006-04-09 ENCOUNTER — Emergency Department: Payer: Self-pay | Admitting: Emergency Medicine

## 2006-04-09 ENCOUNTER — Telehealth (HOSPITAL_BASED_OUTPATIENT_CLINIC_OR_DEPARTMENT_OTHER): Payer: Self-pay

## 2006-04-09 LAB — CHG ASSAY OF BLOOD/URIC ACID: URIC ACID: 5.7 mg/dl (ref 2.6–8.0)

## 2006-04-09 LAB — XR FINGER(S) RIGHT MINIMUM 2 VIEWS

## 2006-04-09 NOTE — Telephone Encounter (Signed)
Staff Message copied by Marnette Burgess on 04/09/2006 at 4:03 PM  ------   Message from: AFIFE, CELESTE   Created: 04/09/2006 at 3:55 PM   Regarding: for Dr Delora Fuel   Contact: [phone]    Beth Hudson [id number], 60 year old, female, [phone] (home) [phone] (work)    Cleotis Lema NUMBER: [phone]  Cell phone:   Other phone:    Available times:    Patient's language of care: English    Patient does not need an interpreter.    Patient's PCP: Elson Clan, MD, MD    Person calling on behalf of patient: patient (self)    Calls today to speak to provider only.    Patient's Preferred Pharmacy:   No Pharmacies Listed

## 2006-04-09 NOTE — Telephone Encounter (Signed)
Pt wishes to speak to provider only. Forwarded to provider for review.

## 2006-04-10 LAB — EMERGENCY ROOM NOTE

## 2006-04-10 NOTE — Telephone Encounter (Signed)
I called the patient and discussed with her questions regarding her finger possible tiny avulsion fx.. She had appt. With Dr. Olam Idler rescheduled for 04/14/06. She has pain sp. If she bumps her finger, says she feels as if ? Bone is rubbing against bone. I have given her a letter to remain out of work, she works in [employer], [employer].  I have told her to walk in tomorrow to the office to have her finger re-checked by one of the providers here.  Elson Clan, MD

## 2006-04-11 NOTE — Telephone Encounter (Signed)
Letter at front desk for pt when she comes in.

## 2006-04-14 ENCOUNTER — Encounter (HOSPITAL_BASED_OUTPATIENT_CLINIC_OR_DEPARTMENT_OTHER): Payer: BLUE CROSS/BLUE SHIELD | Admitting: Hand Surgery

## 2006-04-14 DIAGNOSIS — M19049 Primary osteoarthritis, unspecified hand: Secondary | ICD-10-CM

## 2006-04-24 LAB — ORTHOPEDIC OFFICE NOTE

## 2006-06-13 ENCOUNTER — Encounter (HOSPITAL_BASED_OUTPATIENT_CLINIC_OR_DEPARTMENT_OTHER): Payer: Self-pay

## 2006-07-09 ENCOUNTER — Ambulatory Visit (HOSPITAL_BASED_OUTPATIENT_CLINIC_OR_DEPARTMENT_OTHER): Payer: BLUE CROSS/BLUE SHIELD

## 2006-07-09 ENCOUNTER — Encounter (HOSPITAL_BASED_OUTPATIENT_CLINIC_OR_DEPARTMENT_OTHER): Payer: Self-pay

## 2006-07-09 VITALS — BP 110/60 | Temp 98.1°F | Wt 124.0 lb

## 2006-07-09 DIAGNOSIS — M949 Disorder of cartilage, unspecified: Secondary | ICD-10-CM

## 2006-07-09 DIAGNOSIS — Z0000 Encounter for general adult medical examination without abnormal findings: Secondary | ICD-10-CM

## 2006-07-09 DIAGNOSIS — S63509A Unspecified sprain of unspecified wrist, initial encounter: Secondary | ICD-10-CM

## 2006-07-09 DIAGNOSIS — M899 Disorder of bone, unspecified: Secondary | ICD-10-CM

## 2006-07-09 DIAGNOSIS — E039 Hypothyroidism, unspecified: Secondary | ICD-10-CM

## 2006-07-09 DIAGNOSIS — Z23 Encounter for immunization: Secondary | ICD-10-CM

## 2006-07-09 DIAGNOSIS — D649 Anemia, unspecified: Secondary | ICD-10-CM

## 2006-07-09 LAB — BLOOD COUNT COMPLETE AUTO&AUTO DIFRNTL WBC
BASOPHIL %: 0.4 % (ref 0–2)
EOSINOPHIL %: 1.8 % (ref 0–7)
HEMATOCRIT: 40.3 % (ref 37.0–47.0)
HEMOGLOBIN: 13.8 g/dL (ref 12.0–16.0)
LYMPHOCYTE %: 30.2 % (ref 12–39)
MEAN CORP HGB CONC: 34.1 g/dL (ref 32.0–36.0)
MEAN CORPUSCULAR HGB: 29 pg (ref 27.0–31.0)
MEAN CORPUSCULAR VOL: 85.2 fL (ref 81.0–99.0)
MEAN PLATELET VOLUME: 7.9 fL (ref 6.4–10.8)
MONOCYTE %: 7.1 % (ref 1–12)
NEUTROPHIL %: 60.5 % (ref 46–79)
PLATELET COUNT: 281 10*3/uL (ref 150–400)
RBC DISTRIBUTION WIDTH: 13.6 % (ref 11.5–14.3)
RED BLOOD CELL COUNT: 4.74 MIL/uL (ref 4.20–5.40)
WHITE BLOOD CELL COUNT: 6.1 10*3/uL (ref 4.8–10.8)

## 2006-07-09 LAB — CHG LIPID PANEL
Cholesterol: 219 mg/dl — ABNORMAL HIGH (ref 0–200)
HIGH DENSITY LIPOPROTEIN: 44 mg/dl (ref 35–85)
LOW DENSITY LIPOPROTEIN DIRECT: 138 mg/dl — ABNORMAL HIGH (ref 0–100)
RISK FACTOR: 5 — ABNORMAL HIGH (ref ?–4.4)
TRIGLYCERIDES: 163 mg/dl — ABNORMAL HIGH (ref 0–150)

## 2006-07-09 LAB — COMPREHENSIVE METABOLIC PANEL
ALANINE AMINOTRANSFERASE: 17 IU/L (ref 7–35)
ALBUMIN: 4.3 g/dl (ref 3.4–4.8)
ALKALINE PHOSPHATASE: 78 IU/L (ref 25–106)
ANION GAP: 7 mmol/L (ref 2–25)
ASPARTATE AMINOTRANSFERASE: 19 IU/L (ref 8–34)
BILIRUBIN TOTAL: 0.9 mg/dl (ref 0.2–1.1)
BUN (UREA NITROGEN): 12 mg/dl (ref 6–20)
CALCIUM: 9.9 mg/dl (ref 8.6–10.0)
CARBON DIOXIDE: 29 mmol/L (ref 22–32)
CHLORIDE: 103 mmol/L (ref 101–111)
CREATININE: 0.7 mg/dl (ref 0.4–1.2)
Glucose Random: 100 mg/dl (ref 74–160)
POTASSIUM: 4.3 mmol/L (ref 3.5–5.1)
SODIUM: 139 mmol/L (ref 135–144)
TOTAL PROTEIN: 7.5 g/dl (ref 5.9–7.5)

## 2006-07-09 LAB — TSH (THYROID STIMULATING HORMONE): TSH (THYROID STIM HORMONE): 8.29 u[IU]/mL — ABNORMAL HIGH (ref 0.34–5.60)

## 2006-07-09 MED ORDER — FOSAMAX 70 MG PO TABS
ORAL_TABLET | ORAL | Status: DC
Start: 2006-07-09 — End: 2007-08-10

## 2006-07-09 MED ORDER — SYNTHROID 150 MCG PO TABS
ORAL_TABLET | ORAL | Status: DC
Start: 2006-07-09 — End: 2007-08-10

## 2006-07-09 MED ORDER — CALCIUM + D 600-200 MG-UNIT PO TABS
ORAL_TABLET | ORAL | Status: AC
Start: 2006-07-09 — End: 2007-06-09

## 2006-07-09 NOTE — Progress Notes (Signed)
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]    Current outpatient prescriptions:  FOSAMAX 70 MG OR TABS, 1 TABLET WEEKLY ON AN EMPTY STOMACH, Disp: 4, Rfl: 11  SYNTHROID 150 MCG OR TABS, 1 TABLET DAILY ; NO substitution, Disp: 30, Rfl: 11  CALCIUM + D 600-200 MG-UNIT OR TABS, 1 TABLET twice DAILY, Disp: 30, Rfl: 12  CENTRUM SILVER OR TABS, 1 TABLET DAILY, Disp: 30, Rfl: 0  CALTRATE 600 + D 600-200 MG-IU OR TABS, 1 TABLET twice daily, Disp: 60, Rfl: 11    Review of Patient's Allergies indicates:   Strawberry Rash   Comment: Unable to eat fresh strawberries, ok with the rest    Past Medical History:   PYELONEPHRITIS NOS    Comment: None for 28 yrs.    Past Surgical History:   LIGATE OVIDUCT(S) ADD-ON    APPENDECTOMY     Review of patient's family history indicates:   Heart Mother    Comment: HX. CABG   Hypertension Mother    Renal Mother    Comment: died 3, died of renal failure   Stroke Sister    Comment: age 57, now 13   Stroke Father    Comment: died age 35   Cancer - Other Brother    Comment: In his back, ? Bone, doing well    Social History   Marital Status: Married Spouse Name:    Years of Education: Number of children:     Occupational History   None on file    Social History Main Topics   Tobacco Use: Never    Comment: 2 nd hand smoking   Alcohol Use: No    Drug Use: No    Sexual Activity: Not Currently    Other Topics Concern   None on file    Social History Narrative   Live with ex-husband with end stage Lung Dz. And son age 24. She has 3 daughters ages 52,34,32. She works at [employer].    Patient here for Physical.  Refers left wrist pain for 2 weeks, took otc meds without relief; mentions that Chineese patches work for a little for pain relief. She's Right handed; no weakness, paresthesias of arm or hand. She works at store doing  Financial trader, Scientist, physiological, etc..      Review of Systems  General- No change in weight, appetite, fevers, dizziness.  Skin- Negative  Ears/Nose/Throat- No ear pain, allergies, frequent sore throats, nasal congestion.  Pulmonary- No SOB, cough, asthma.  Cardiac- No CP, palptitations, orthopnea.  Gastrointestinal- No abdominal pain. Constipation, diarrheas, rectal bleeding.  Genitourinary- No dysuria, frequency, hematuria.  Neuro- No headaches, numbness, paresthesias or weakness of extremities.  Psychiatric- Denies depression, anxiety sxs.  Musculoskeletal- No hx. Of joint swelling, arthralgias.  GYN- No PMB, menopausal sxs., vaginal discharge    Appearance: in no apparent distress and well developed and well nourished female  HEENT- Clear conjunctiva and sclera. PERLA. EOM intact. Ears- Normal ext. Canal with small bilat. Serous effussions. Throat- Unremarkable.  The neck is supple and free of adenopathy or masses, the thyroid is normal without enlargement or nodules. Carotids with good bilat. Pulses, no bruits heard.  Breasts are symmetric. No dominant, discrete, fixed or suspicious masses are noted. No skin or nipple changes or axillary nodes. Self exam  is taught and encouraged. Mammogram -is due and ordered today.  Heart s1 and S2 normal,no murmurs, clicks, gallops or rubs. Regular rate and rhythm. Chest is clear; no wheezes or rales. No edema.  The abdomen is soft without tenderness, guarding, mass, rebound or organomegaly. Bowel sounds are normal. No CVA tenderness or inguinal adenopathy not felt.  Left wrist- There is normal ROM with tenderness upon lateral rotation. Good radial pulse. Negative Tinnel's. Decreased hand grip due to pain.  Vagina and vulva are normal; no discharge is noted. Cervix normal, multiparous, without lesions. Uterus anteverted and mobile, normal in size and shape without tenderness. Adnexa normal in size without masses or tenderness. Pap Smear - is completed today.    Rectal-Deferred.  Neuro: Gait is normal. Good strength in all extremities with normal muscle tone. Cranial nerves are intact.    V70.0 ROUTINE MEDICAL EXAM (primary encounter diagnosis)  Stressed:   Monthly Breast self exams   Balanced meals with 5-6 servings of fruits and vegetables in a day   calcium intake 1200mg /day   Regular exercise   seatbelts use    Plan: ROUTINE VENIPUNCTURE, COMPLETE CBC W/AUTO DIFF    WBC, THYROID STIM HORMONE, LIPID PANEL,    COMPREHENSIVE METABOLIC PANEL, PAP SMEAR THIN    PREP, REFERRAL TO GASTROENTEROLOGY (INT)       842.00 SPRAIN OF WRIST NOS  Rec. NSAID, Ortho/hand f/u. Her sxs. Are rel. To her work activities and she has notified her employer. I have rec. That she doesn't do any heavy lifting, pushing for now.  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  Plan: FOSAMAX 70 MG OR TABS, CALCIUM + D 600-200    MG-UNIT OR TABS       285.9 ANEMIA NOS  Recheck CBC  Plan: ROUTINE VENIPUNCTURE       244.9 HYPOTHYROIDISM NOS  Recheck TSH to assess tx.    V06.5 VACCINE FOR TETANUS/DIPHTERIA    Plan: IMMUNIZATION ADMIN SINGLE, RN, TD VACCINE > 7,    IM             07/09/2006  VIS given prior to administration and reviewed with the patient and or legal guardian. Patient understands the disease and the vaccine. See immunization/Injection module or chart review for date of publication and additional information.  Elson Clan, MD

## 2006-07-09 NOTE — Patient Instructions (Signed)
OTHER CALCIUM SUPPLEMENTS:    TUMS  REGULAR 3PILLS/DAY  X-STRENGTH 2PILLS/DAT    VIACTV

## 2006-07-28 LAB — CYTOPATH, C/V, THIN LAYER

## 2006-07-30 ENCOUNTER — Telehealth (HOSPITAL_BASED_OUTPATIENT_CLINIC_OR_DEPARTMENT_OTHER): Payer: Self-pay

## 2006-07-30 DIAGNOSIS — E039 Hypothyroidism, unspecified: Secondary | ICD-10-CM

## 2006-07-30 MED ORDER — SYNTHROID 175 MCG PO TABS
ORAL_TABLET | ORAL | Status: AC
Start: 2006-07-30 — End: 2006-12-30

## 2006-07-30 NOTE — Telephone Encounter (Signed)
Discussed with patient lab results done 07/28/06 as well as PAP. All normal EXCEPT TSH still high, 8.29; she says she's taking Syntroid ( DAW please!) as rx.. I will increase her Synthroid dose to 175 mcg a day and and ask her to repeat TSH after taking new dose for 2 months and f/u with new PCP. I have also advised her on eating low chol diet and rechecking lipids in 1 year.  Elson Clan, MD

## 2006-08-01 ENCOUNTER — Ambulatory Visit (HOSPITAL_BASED_OUTPATIENT_CLINIC_OR_DEPARTMENT_OTHER): Payer: BLUE CROSS/BLUE SHIELD | Admitting: Rehabilitative and Restorative Service Providers"

## 2006-08-01 ENCOUNTER — Ambulatory Visit (HOSPITAL_BASED_OUTPATIENT_CLINIC_OR_DEPARTMENT_OTHER): Payer: BLUE CROSS/BLUE SHIELD | Admitting: Physician Assistant

## 2006-08-01 DIAGNOSIS — M654 Radial styloid tenosynovitis [de Quervain]: Secondary | ICD-10-CM

## 2006-08-01 LAB — XR WRIST LEFT MINIMUM 3 VIEWS

## 2006-08-19 LAB — ORTHOPEDIC OFFICE NOTE

## 2006-09-02 ENCOUNTER — Encounter (HOSPITAL_BASED_OUTPATIENT_CLINIC_OR_DEPARTMENT_OTHER): Payer: BLUE CROSS/BLUE SHIELD | Admitting: Gastroenterology

## 2006-09-02 ENCOUNTER — Encounter (HOSPITAL_BASED_OUTPATIENT_CLINIC_OR_DEPARTMENT_OTHER): Payer: Self-pay | Admitting: Gastroenterology

## 2006-09-26 ENCOUNTER — Ambulatory Visit (HOSPITAL_BASED_OUTPATIENT_CLINIC_OR_DEPARTMENT_OTHER): Payer: BLUE CROSS/BLUE SHIELD | Admitting: Physician Assistant

## 2006-09-26 DIAGNOSIS — M654 Radial styloid tenosynovitis [de Quervain]: Secondary | ICD-10-CM

## 2006-10-07 LAB — ORTHOPEDIC OFFICE NOTE

## 2007-02-26 ENCOUNTER — Other Ambulatory Visit (HOSPITAL_BASED_OUTPATIENT_CLINIC_OR_DEPARTMENT_OTHER): Payer: Self-pay | Admitting: Registered Nurse

## 2007-02-26 ENCOUNTER — Ambulatory Visit (HOSPITAL_BASED_OUTPATIENT_CLINIC_OR_DEPARTMENT_OTHER): Payer: PRIVATE HEALTH INSURANCE | Admitting: Registered Nurse

## 2007-02-26 DIAGNOSIS — D649 Anemia, unspecified: Secondary | ICD-10-CM

## 2007-02-26 DIAGNOSIS — E039 Hypothyroidism, unspecified: Secondary | ICD-10-CM

## 2007-02-26 DIAGNOSIS — M949 Disorder of cartilage, unspecified: Secondary | ICD-10-CM

## 2007-02-26 DIAGNOSIS — M899 Disorder of bone, unspecified: Secondary | ICD-10-CM

## 2007-02-26 NOTE — Telephone Encounter (Signed)
Pt here as a w/i. Has new insurance and needs refills on her script. She will call for an appointment when he insurance becomes active and she gets her [facility] card. Will pick a new PCP.

## 2007-08-10 ENCOUNTER — Encounter: Payer: Self-pay | Admitting: Internal Medicine

## 2007-08-10 ENCOUNTER — Ambulatory Visit: Payer: PRIVATE HEALTH INSURANCE | Admitting: Internal Medicine

## 2007-08-10 VITALS — BP 120/60 | HR 84 | Temp 98.5°F | Ht 64.0 in | Wt 144.0 lb

## 2007-08-10 DIAGNOSIS — M949 Disorder of cartilage, unspecified: Secondary | ICD-10-CM

## 2007-08-10 DIAGNOSIS — Z0000 Encounter for general adult medical examination without abnormal findings: Secondary | ICD-10-CM

## 2007-08-10 DIAGNOSIS — E039 Hypothyroidism, unspecified: Secondary | ICD-10-CM

## 2007-08-10 DIAGNOSIS — Z124 Encounter for screening for malignant neoplasm of cervix: Secondary | ICD-10-CM

## 2007-08-10 DIAGNOSIS — Z1231 Encounter for screening mammogram for malignant neoplasm of breast: Secondary | ICD-10-CM

## 2007-08-10 DIAGNOSIS — M899 Disorder of bone, unspecified: Secondary | ICD-10-CM

## 2007-08-10 DIAGNOSIS — D649 Anemia, unspecified: Secondary | ICD-10-CM

## 2007-08-10 LAB — URINALYSIS
BACTERIA: >50 PER HPF — AB (ref 0–?)
BILIRUBIN, URINE: NEGATIVE
CASTS: NONE SEEN PER LPF
CRYSTALS: NONE SEEN
GLUCOSE, URINE: NEGATIVE MG/DL
KETONE, URINE: NEGATIVE MG/DL
NITRITE, URINE: POSITIVE — AB
PH URINE: 6 (ref 5.0–8.0)
PROTEIN, URINE: NEGATIVE MG/DL
RED BLOOD CELLS URINE: NONE SEEN PER HPF (ref 0–2)
SPECIFIC GRAVITY URINE: 1.02 (ref 1.003–1.035)

## 2007-08-10 LAB — BLOOD COUNT COMPLETE AUTO&AUTO DIFRNTL WBC
BASOPHIL %: 0.6 % (ref 0.0–2.0)
EOSINOPHIL %: 2 % (ref 0.0–7.0)
HEMATOCRIT: 38.8 % (ref 36.0–48.0)
HEMOGLOBIN: 13.2 g/dl (ref 12.0–16.0)
LYMPHOCYTE %: 23.8 % (ref 13.0–39.0)
MEAN CORP HGB CONC: 34 g/dl (ref 32.0–36.0)
MEAN CORPUSCULAR HGB: 28.6 pg (ref 27.0–33.0)
MEAN CORPUSCULAR VOL: 84.1 fl (ref 80.0–100.0)
MEAN PLATELET VOLUME: 7.8 fl (ref 6.4–10.8)
MONOCYTE %: 6.5 % (ref 1.0–12.0)
NEUTROPHIL %: 67.1 % (ref 46.0–79.0)
PLATELET COUNT: 310 10*3/uL (ref 150–400)
RBC DISTRIBUTION WIDTH: 13 % (ref 11.5–14.3)
RED BLOOD CELL COUNT: 4.62 M/uL (ref 4.50–5.10)
WHITE BLOOD CELL COUNT: 8.5 10*3/uL (ref 4.0–10.8)

## 2007-08-10 LAB — BASIC METABOLIC PANEL
ANION GAP: 8 mmol/L (ref 2–25)
BUN (UREA NITROGEN): 10 mg/dl (ref 6–20)
CALCIUM: 9.6 mg/dl (ref 8.6–10.0)
CARBON DIOXIDE: 28 mmol/L (ref 22–32)
CHLORIDE: 102 mmol/L (ref 101–111)
CREATININE: 0.7 mg/dl (ref 0.4–1.2)
ESTIMATED GLOMERULAR FILT RATE: >60 mL/min (ref 60–116)
Glucose Random: 102 mg/dl (ref 74–160)
POTASSIUM: 4 mmol/L (ref 3.5–5.1)
SODIUM: 138 mmol/L (ref 135–144)

## 2007-08-10 LAB — CHG LIPOPROTEIN DIR MEAS HIGH DENSITY CHOLESTEROL: HIGH DENSITY LIPOPROTEIN: 33 mg/dl — ABNORMAL LOW (ref 35–85)

## 2007-08-10 LAB — CHG LIPOPROTEIN DIRECT MEASUREMENT LDL CHOLESTEROL: LOW DENSITY LIPOPROTEIN DIRECT: 98 mg/dl (ref 0–100)

## 2007-08-10 LAB — CHG ASSAY OF FREE THYROXINE: FREE THYROXINE: 0.56 ng/dl (ref 0.55–1.12)

## 2007-08-10 LAB — THYROID SCREEN TSH REFLEX FT4: THYROID SCREEN TSH REFLEX FT4: 11.94 u[IU]/mL — ABNORMAL HIGH (ref 0.34–5.60)

## 2007-08-10 LAB — CHOLESTEROL SERUM/WHOLE BLOOD TOTAL: Cholesterol: 200 mg/dl (ref 0–200)

## 2007-08-10 MED ORDER — FOSAMAX 70 MG PO TABS
ORAL_TABLET | ORAL | Status: DC
Start: 2007-08-10 — End: 2008-03-29

## 2007-08-10 MED ORDER — CALTRATE 600 + D 600-200 MG-IU OR TABS
ORAL_TABLET | ORAL | Status: DC
Start: 2007-08-10 — End: 2008-03-29

## 2007-08-10 MED ORDER — SYNTHROID 150 MCG PO TABS
ORAL_TABLET | ORAL | Status: DC
Start: 2007-08-10 — End: 2008-03-29

## 2007-08-10 NOTE — Progress Notes (Signed)
Beth Hudson is a 61 year old female who requests a PE. She ran out of her medications several months ago and did not seek refills because last pcp moved away. She has no complaints today. Her weight has gone up 20 pounds in the last year.     ROS: GI neg; GU neg; Resp neg; CV neg; Psych neg; Rheum neg; Neuro neg; Heme-Onc neg.    Past Medical History:   Unspecified Pyelonephritis    Comment: None for 28 yrs.   HYPOTHYROIDISM NOS 09/22/2005   ANEMIA NOS 09/22/2005   Epistaxis 09/17/2005   Comment: Patient required cauterization twice, had    severe episode and dx. With anemia; currently    f/u at [facility]   BONE & CARTILAGE DIS NOS 09/17/2005   Comment: Bone Density 08/06 Osteopenia, Rx. Fosamax.  Past Surgical History:   LIGATE OVIDUCT(S) ADD-ON    APPENDECTOMY    COLONOSCOPY 09/22/00    ESOPHAGUS ENDOSCOPY 07/21/00    Comment: EGD  Current outpatient prescriptions prior to encounter:  FOSAMAX 70 MG OR TABS, 1 TABLET WEEKLY ON AN EMPTY STOMACH, Disp: 4, Rfl: 11  SYNTHROID 150 MCG OR TABS, 1 TABLET DAILY ; NO substitution, Disp: 30, Rfl: 11  CALTRATE 600 + D 600-200 MG-IU OR TABS, 1 TABLET twice daily, Disp: 60, Rfl: 11  CENTRUM SILVER OR TABS, 1 TABLET DAILY, Disp: 30, Rfl: 0    Social History   Marital Status: Married Spouse Name: Lars Mage    Number of children: 4     Occupational History  Occupation Programmer, 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 CABG   Hypertension Mother    Renal Mother    Comment: died 88, died of renal failure   Stroke Sister    Comment: age 23, now 55   Stroke Father    Comment: died age 54   Cancer - Other Brother    Comment: In his back,  ? Bone, doing well    PE:BP 120/60  Pulse 84  Temp (Src) 98.5 F (36.9 C) (Oral)  Ht 5\' 4"  (1.626 m)  Wt 144 lb (65.318 kg)  SpO2 97%  GENERAL: Alert, in no overt distress.  HEENT: Sclera white, conjunctiva normal; TM's and pharynx normal. Gums edentulous.  NECK: Supple; no thyromegaly or masses; no nodes; no bruits.  CHEST: Normal breath sounds; no rales, wheezes, rhonchi.  BREASTS: no masses; Areolae and axillae neg.  CARDIOVASCULAR: PMI physiologic; S1 S2 normal; No murmurs or gallops.  ABDOMEN: Soft, nontender without organomegaly or masses; no bruits.  EXTREMITIES: Normal mobility of back, hips, knees. No edema. Intact pedal pulses.  NEURO: Normal speech and gait. Delayed recovery of DTR's. Affect is normal.    Imp and Plan:  V70.0 Routine General Medical Examination at a Health Care Facility (primary encounter diagnosis)  Plan: ROUTINE VENIPUNCTURE, COMPLETE CBC W/ AUTO DIFF   WBC, BASIC METABOLIC PANEL, ASSAY, BLD/SERUM    CHOLESTEROL, ASSAY OF BLOOD LIPOPROTEIN, ASSAY    OF LIPOPROTEIN, URINALYSIS  733.90 Disorder of Bone and Cartilage, Unspecified  Comment: History of osteoporosis  Plan: FOSAMAX 70 MG OR TABS, CALTRATE 600 + D 600-200   MG-IU OR TABS, VITAMIN D 1,25-DIHYDROXY    244.9 HYPOTHYROIDISM NOS  Comment: clinically hypothyroid  Plan: SYNTHROID 150 MCG OR TABS, THYROID SCREEN TSH    285.9 ANEMIA NOS  Comment: Distant history of anemia; GI w/u unrevealing  Plan: CBC    V76.12 Other Screening Mammogram  Comment: Mammogram ordered

## 2007-08-13 LAB — VITAMIN D 1,25-DIHYDROXY: VITAMIN D 1,25-DIHYDROXY: 31.4 pg/mL (ref 15.9–55.6)

## 2007-08-14 ENCOUNTER — Encounter: Payer: Self-pay | Admitting: Internal Medicine

## 2007-08-14 ENCOUNTER — Other Ambulatory Visit: Payer: Self-pay | Admitting: Internal Medicine

## 2007-08-14 DIAGNOSIS — R8290 Unspecified abnormal findings in urine: Secondary | ICD-10-CM

## 2007-08-14 DIAGNOSIS — E039 Hypothyroidism, unspecified: Secondary | ICD-10-CM

## 2007-08-18 ENCOUNTER — Ambulatory Visit: Payer: Self-pay | Admitting: Internal Medicine

## 2007-08-20 LAB — MA SCREENING MAMMO BILATERAL WITH CAD

## 2007-11-04 ENCOUNTER — Ambulatory Visit (HOSPITAL_BASED_OUTPATIENT_CLINIC_OR_DEPARTMENT_OTHER): Payer: No Typology Code available for payment source | Admitting: Obstetrics & Gynecology

## 2007-11-04 DIAGNOSIS — Z01419 Encounter for gynecological examination (general) (routine) without abnormal findings: Secondary | ICD-10-CM

## 2007-11-04 NOTE — Progress Notes (Signed)
61 year old woman here for annual gyn exam. She has not seen her primary care provider for an annual exam in the last year.     Obstetric History    G4P4  All SVD     BTL.       CC/HPI: routine screening      Current outpatient prescriptions:  FOSAMAX 70 MG OR TABS 1 TABLET WEEKLY ON AN EMPTY STOMACH Disp: 4 Rfl: 11   SYNTHROID 150 MCG OR TABS 1 TABLET DAILY ;  NO substitution Disp: 30 Rfl: 11   CALTRATE 600 + D 600-200 MG-IU OR TABS 1 TABLET twice daily Disp: 60 Rfl: 11   CENTRUM SILVER OR TABS 1 TABLET DAILY Disp: 30 Rfl: 0       HRT:   not applicable  never used    ALLERGIES:  Strawberry    Menstrual Status:  post menopausal    LMP: No LMP date recorded. Reason: Postmenopausal.     Sexual History: not currently sexual active  Past GYN History: negative    Significant OB History: none      Past Medical History    Unspecified Pyelonephritis     Comment: None for 28 yrs.    HYPOTHYROIDISM NOS 09/22/2005    ANEMIA NOS 09/22/2005    Epistaxis 09/17/2005    Comment: Patient required cauterization twice, had severe episode and dx. With anemia; currently f/u at [facility]    BONE & CARTILAGE DIS NOS 09/17/2005    Comment: Bone Density 08/06 Osteopenia, Rx. Fosamax.           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 And son age 70. She has 3 daughters ages 25,34,32. She works at [employer].                     Family History    Heart Mother    Comment: HX. CABG    Hypertension Mother    Renal Mother    Comment: died 56, died of renal failure    Stroke Sister    Comment: age 45, now 40    Stroke Father    Comment: died age 75    Cancer - Other Brother    Comment: In his back, ? Bone, doing well         ROS:  Endocrine: negative  Genitourinary: negative  GI:  None  DV:  none    PHYSICAL EXAM:  Breasts: no masses, skin, nipple or axillary changes  Abdomen: no masses or tenderness,  soft, non-tender and well healed midline vertical scar    PELVIC:  External Genitalia: normal architecture  Vagina: thin-walled/atrophic and no lesions  Vaginal Discharge: normal appearing  Pelvic supports: cystocele: mild  Cervix: no lesions  Uterus: anteverted, normal size and non-tender  Adnexa: not palpable  Rectum: deferred      ASSESSMENT:  Normal GYN annual exam    PLAN:  Pap done, HPV DNA test done and Return in one year or as needed    COUNSELING:  diet and nutrition, exercise and breast self exam    Sherral Hammers

## 2007-11-06 LAB — HUMAN PAPILLOMAVIRUS (HPV): HUMAN PAPILLOMAVIRUS: NEGATIVE

## 2007-11-24 LAB — CYTOPATH, C/V, THIN LAYER

## 2007-11-25 ENCOUNTER — Encounter (HOSPITAL_BASED_OUTPATIENT_CLINIC_OR_DEPARTMENT_OTHER): Payer: Self-pay | Admitting: Obstetrics & Gynecology

## 2008-03-29 ENCOUNTER — Ambulatory Visit (HOSPITAL_BASED_OUTPATIENT_CLINIC_OR_DEPARTMENT_OTHER): Payer: Medicaid Other

## 2008-03-29 ENCOUNTER — Encounter (HOSPITAL_BASED_OUTPATIENT_CLINIC_OR_DEPARTMENT_OTHER): Payer: Self-pay

## 2008-03-29 VITALS — BP 110/80 | Temp 97.5°F | Ht 65.0 in | Wt 142.0 lb

## 2008-03-29 DIAGNOSIS — M949 Disorder of cartilage, unspecified: Principal | ICD-10-CM

## 2008-03-29 DIAGNOSIS — E039 Hypothyroidism, unspecified: Secondary | ICD-10-CM

## 2008-03-29 DIAGNOSIS — M899 Disorder of bone, unspecified: Secondary | ICD-10-CM

## 2008-03-29 MED ORDER — FOSAMAX 70 MG PO TABS
ORAL_TABLET | ORAL | Status: DC
Start: 2008-03-29 — End: 2009-10-10

## 2008-03-29 MED ORDER — CENTRUM SILVER PO TABS
ORAL_TABLET | ORAL | Status: DC
Start: 2008-03-29 — End: 2009-10-10

## 2008-03-29 MED ORDER — CALTRATE 600 + D 600-200 MG-IU OR TABS
ORAL_TABLET | ORAL | Status: DC
Start: 2008-03-29 — End: 2012-05-25

## 2008-03-29 MED ORDER — SYNTHROID 150 MCG PO TABS
ORAL_TABLET | ORAL | Status: DC
Start: 2008-03-29 — End: 2008-11-10

## 2008-03-29 NOTE — Progress Notes (Signed)
Chief Complaint:   Beth Hudson is a 62 year old female who presents for refill of meds and fu of medical problems    History of Present Illness:    Up to date with physical/mammogram and pap smear 08/10/2007. Recently changed insurances and changed pcp to be closer. Used to be a patient of Dr. Tyler Deis. Her son used to be a pt of Dr. Michail Sermon.  Overall doing great. No complaints regarding medications or medical problems.   Compliant with meds.  Last TSH was high and needs recheck    Past Medical History    Unspecified Pyelonephritis     Comment: None for 28 yrs.    HYPOTHYROIDISM NOS 09/22/2005    ANEMIA NOS 09/22/2005    Epistaxis 09/17/2005    Comment: Patient required cauterization twice, had severe episode and dx. With anemia; currently f/u at [facility]    BONE & CARTILAGE DIS NOS 09/17/2005    Comment: Bone Density 08/06 Osteopenia, Rx. Fosamax.           Past Surgical History    LIG/TRNSXJ FLP TUBE DONE TM C DLVR/SURG     APPENDEC     SCOPE OF COLON THRU OSTOMY FOR DIAGNOSIS 09/22/00    ESPHGSC RGD/FLX DX +-COLLJ SPEC BR/WA SPX 07/21/00    Comment: EGD           Current outpatient prescriptions prior to encounter:  FOSAMAX 70 MG OR TABS 1 TABLET WEEKLY ON AN EMPTY STOMACH Disp: 4 Rfl: 12   SYNTHROID 150 MCG OR TABS 1 TABLET DAILY ;  NO substitution Disp: 30 Rfl: 12   CALTRATE 600 + D 600-200 MG-IU OR TABS 1 TABLET twice daily Disp: 60 Rfl: 12   CENTRUM SILVER OR TABS 1 TABLET DAILY Disp: 30 Rfl: 12         Review of Patient's Allergies indicates:   Strawberry              Rash    Comment: Unable to eat fresh strawberries, ok with the rest      Social History   Marital Status: Married  Spouse Name: Lars Mage    Years of Education: N/A  Number of Children: 4     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        Other Topics Concern    Sleep Concern No    Stress Concern No    Weight Concern No    Special Diet No     Social History Narrative    Live with ex-husband with end stage Lung Dz. And son age 68. She has 3 daughters ages 55,34,32. She works at [employer].         Family History    Heart Mother    Comment: HX. CABG    Hypertension Mother    Renal Mother    Comment: died 36, died of renal failure    Stroke Sister    Comment: age 61, now 3    Stroke Father    Comment: died age 72    Cancer - Other Brother    Comment: In his back, ? Bone, doing well  Review Of Systems  Skin: negative  Eyes: negative  Ears/Nose/Throat: negative  Respiratory: negative  Cardiovascular: negative  Gastrointestinal: negative  Genitourinary: negative  Musculoskeletal: negative  Neurologic: negative  Psychiatric: negative  Hematologic/Lymphatic/Immunologic: negative  Endocrine: negative    PHYSICAL EXAMINATION:  BP 110/80  Temp (Src) 97.5 F (36.4 C) (Temporal)  Ht 5\' 5"  (1.651 m)  Wt 142 lb (64.411 kg)  LMP Postmenopausal  General appearance: healthy, alert, well developed, well nourished  Skin: skin color, texture, turgor are normal  Head: Normocephalic. No masses, lesions, tenderness or abnormalities  Eyes: conjunctivae/corneas clear. PERRL, EOM's intact. Fundi benign  Ears: External ears normal. Canals clear. TM's normal.  Nose/Sinuses: Nares normal. Septum midline. Mucosa normal. No drainage or sinus tenderness.  Oropharynx: Lips, mucosa, and tongue normal. Teeth and gums normal. Oropharynx moist and without lesion  Neck: Neck supple. No adenopathy. Thyroid symmetric, normal size, and without nodularity  Lungs:  Lungs clear to auscultation bilaterally  Heart:  RRR. No murmurs, clicks, gallops or rubs  Abdomen: Abdomen soft, non-tender. BS normal. No masses, no organomegaly  Extremities:no edema, good pulses peripherally.  Neuro: Gait normal.     ASSESSMENT/PLAN:  New patient to the clinic, but in  the past was seen here. Up to date with CPE/ MAMMO/COLON/PAP 07/2007. Refill of meds given. Needs up date on thyroid function test.   733.90 Disorder of Bone and Cartilage, Unspecified  (primary encounter diagnosis)  Comment:   Plan: FOSAMAX 70 MG OR TABS, CALTRATE 600 + D 600-200        MG-IU OR TABS, CENTRUM SILVER OR TABS          244.9 HYPOTHYROIDISM NOS  Plan: SYNTHROID 150 MCG OR TABS, THYROID STIM HORMONE

## 2008-04-01 ENCOUNTER — Telehealth (HOSPITAL_BASED_OUTPATIENT_CLINIC_OR_DEPARTMENT_OTHER): Payer: Self-pay | Admitting: Family Medicine

## 2008-04-01 DIAGNOSIS — E039 Hypothyroidism, unspecified: Secondary | ICD-10-CM

## 2008-04-01 MED ORDER — LEVOTHYROXINE SODIUM 150 MCG PO TABS
ORAL_TABLET | ORAL | Status: AC
Start: 2008-04-01 — End: 2008-10-02

## 2008-04-01 NOTE — Telephone Encounter (Signed)
Staff Message copied by Jeanene Erb on Fri Apr 01, 2008 11:26 AM  ------   Message from: Mardelle Matte   Created: Fri Apr 01, 2008 11:13 AM   Regarding: Medication    Contact: [phone]    Dayleen Beske [id number], 62 year old, female, Telephone Information:  Home Phone [phone]  Work Phone [phone]      Cleotis Lema NUMBER: [phone]  Best time to call back:anytime  Cell phone:   Other phone:    Available times:    Patient's language of care: English    Patient does not need an interpreter.    Patient's PCP: Lorina Rabon    Person calling on behalf of patient: Patient (self)    Calls today for med refill(s). Synthroid 150 Mcg or Tabs    Patient's Preferred Pharmacy:   [facility]  Phone: [phone] Fax: [phone]

## 2008-04-01 NOTE — Telephone Encounter (Signed)
Beth Hudson, wrote Rx for generic. Could you let her know that this medication is only 4$ at [facility]? More expensive at her pharmacy. Thanks! DrSS  Happy Nurses week.

## 2008-04-01 NOTE — Telephone Encounter (Signed)
T/c to pt and pharmacy. Rx called to [facility] as requested and pt informed that she can get it at [facility] for  $4. If not covered by insurance she will call back and request a pharmacy change.

## 2008-04-01 NOTE — Telephone Encounter (Signed)
[facility]'t [facility]. Would like rx  Changed to generic so she can afford it. Please authorize.

## 2008-11-04 ENCOUNTER — Encounter (HOSPITAL_BASED_OUTPATIENT_CLINIC_OR_DEPARTMENT_OTHER): Payer: Self-pay

## 2008-11-10 ENCOUNTER — Encounter (HOSPITAL_BASED_OUTPATIENT_CLINIC_OR_DEPARTMENT_OTHER): Payer: Self-pay

## 2008-11-10 ENCOUNTER — Ambulatory Visit (HOSPITAL_BASED_OUTPATIENT_CLINIC_OR_DEPARTMENT_OTHER): Payer: Medicaid Other

## 2008-11-10 VITALS — BP 134/88 | Temp 98.2°F | Ht 65.0 in | Wt 138.4 lb

## 2008-11-10 DIAGNOSIS — R05 Cough: Secondary | ICD-10-CM

## 2008-11-10 DIAGNOSIS — E039 Hypothyroidism, unspecified: Secondary | ICD-10-CM

## 2008-11-10 DIAGNOSIS — Z13220 Encounter for screening for lipoid disorders: Secondary | ICD-10-CM

## 2008-11-10 DIAGNOSIS — R059 Cough, unspecified: Secondary | ICD-10-CM

## 2008-11-10 DIAGNOSIS — Z0000 Encounter for general adult medical examination without abnormal findings: Secondary | ICD-10-CM

## 2008-11-10 LAB — COMPREHENSIVE METABOLIC PANEL
ALANINE AMINOTRANSFERASE: 30 IU/L (ref 7–35)
ALBUMIN: 4.4 g/dl (ref 3.4–4.8)
ALKALINE PHOSPHATASE: 109 IU/L — ABNORMAL HIGH (ref 25–106)
ANION GAP: 8 mmol/L (ref 2–25)
ASPARTATE AMINOTRANSFERASE: 23 IU/L (ref 8–34)
BILIRUBIN TOTAL: 0.5 mg/dl (ref 0.2–1.1)
BUN (UREA NITROGEN): 12 mg/dl (ref 6–20)
CALCIUM: 9.6 mg/dl (ref 8.6–10.3)
CARBON DIOXIDE: 29 mmol/L (ref 22–32)
CHLORIDE: 104 mmol/L (ref 101–111)
CREATININE: 0.7 mg/dl (ref 0.4–1.2)
ESTIMATED GLOMERULAR FILT RATE: >60 mL/min (ref 60–116)
Glucose Random: 94 mg/dl (ref 74–160)
POTASSIUM: 3.6 mmol/L (ref 3.5–5.1)
SODIUM: 141 mmol/L (ref 135–144)
TOTAL PROTEIN: 7.3 g/dl (ref 5.9–7.5)

## 2008-11-10 LAB — CHG LIPOPROTEIN DIRECT MEASUREMENT LDL CHOLESTEROL: LOW DENSITY LIPOPROTEIN DIRECT: 121 mg/dl — ABNORMAL HIGH (ref 0–100)

## 2008-11-10 LAB — TSH (THYROID STIMULATING HORMONE): TSH (THYROID STIM HORMONE): 14.26 u[IU]/mL — ABNORMAL HIGH (ref 0.34–5.60)

## 2008-11-10 LAB — CHOLESTEROL SERUM/WHOLE BLOOD TOTAL: Cholesterol: 208 mg/dl — ABNORMAL HIGH (ref 0–200)

## 2008-11-10 LAB — CHG LIPOPROTEIN DIR MEAS HIGH DENSITY CHOLESTEROL: HIGH DENSITY LIPOPROTEIN: 46 mg/dl (ref 35–85)

## 2008-11-10 MED ORDER — SYNTHROID 150 MCG PO TABS
ORAL_TABLET | ORAL | Status: DC
Start: 2008-11-10 — End: 2008-11-11

## 2008-11-10 NOTE — Progress Notes (Signed)
Beth Hudson is a 62 year old female who presents for a physical exam.     Patient Concerns:  Patient has a history of a bloody nose which had resolved but she had 7 episodes of bleeding from her nose on sitting up in bed in the morning this past couple of weeks. Patient does not report lightheadedness, dizziness or shortness of breath.      Patient has also had an isolated nonproductive cough for the last 2 months. She says that her throat feels irritated and sometimes she coughs hard and is unable to breathe. She has been taken Nyquil for it with minimal relief. Patient lives with her husband who smokes 2.5 packs a day in the house and the windows are generally closed for the winter. Patient does not have a recent history of a URI. Patient does not have any history of reflux or reflux symptoms. Patient does not have a history of asthma and is not wheezing.  Patient has never had anything like this before. No fevers, chills, or weight loss. No runny nose, sore throat, shortness of breath or chest pain. Patient does not have seasonal allergies.  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]        Current outpatient prescriptions:  FOSAMAX 70 MG OR TABS 1 TABLET WEEKLY ON AN EMPTY STOMACH Disp: 4 Rfl: 12   SYNTHROID 150 MCG OR TABS 1 TABLET DAILY ;  NO substitution Disp: 30 Rfl: 12   CALTRATE 600 + D 600-200 MG-IU OR TABS 1 TABLET twice daily Disp: 60 Rfl: 12   CENTRUM SILVER OR TABS 1 TABLET DAILY Disp: 30 Rfl: 12         Allergies:  Review of Patient's Allergies indicates:   Strawberry              Rash    Comment: Unable to eat fresh strawberries, ok with the rest    Health Maintenance:  FLU VACCINE SEASONAL due on 07/26/2008    HEP B HIGH RISK VACCINE EVAL (ONCE) due on 06/10/1965    HIV SCREENING due on  06/10/1960    MAMMOGRAPHY( YEARLY) due on 08/19/2008    PHYSICAL EXAM (AGE 24+) due on 11/03/2008    ZOSTER VACCINE,AGE 30 AND OLDER (ONCE) due on 06/11/2007    COLONOSCOPY due on 09/22/2010    LIPID SCREENING due on 07/10/2011    PAP SMEAR due on 11/03/2010    TETANUS (16 AND OVER) due on 07/09/2016      Immunizations:    Immunization History    FLU VACCINE,3 YEARS AND ABOVE 0.50 ML                          09/17/2005      TD                    07/09/2006      Histories:    Past Medical History    Unspecified Pyelonephritis     Comment: None for 28 yrs.    HYPOTHYROIDISM NOS 09/22/2005    ANEMIA NOS 09/22/2005    Epistaxis 09/17/2005    Comment: Patient required cauterization twice, had severe episode and dx. With  anemia; currently f/u at [facility]    BONE & CARTILAGE DIS NOS 09/17/2005    Comment: Bone Density 08/06 Osteopenia, Rx. Fosamax.         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 And son age 64. She has 3 daughters ages 27,34,32. She works at [employer].       Family History    Heart Mother    Comment: HX. CABG    Hypertension Mother    Renal Mother    Comment: died 62, died of renal failure    Stroke Sister    Comment: age 45, now 28    Stroke Father    Comment: died age 63    Cancer -  Other Brother    Comment: In his back, ? Bone, doing well         Review of Systems:                     Ears/Nose/Throat: No runny nose, sore throat, head congestion.   Respiratory: Patient has a cough and when she is coughing has a hard time breathing. She denies sputum, hemoptysis, pleurisy, asthma, wheezing and shortness of breath.  Cardiovascular: No tachycardia, chest pain or pressure or lower extremity edema  Gastrointestinal: No heartburn or reflux, abdominal pain, constipation or diarrhea  Musculoskeletal:Patient reports some mild joint pain in her knees and occasional irritation in her left wrist when she is doing a lot of cleaning.  Endocrine: Patient reports some cold intolerance. No weight changes, or energy changes.  Hematologic/Lymphatic/Immunologic: No fever, night sweats, chills, weight loss, swollen nodes or allergies    Physical:  BP 134/88  Temp (Src) 98.2 F (36.8 C) (Oral)  Ht 5\' 5"  (1.651 m)  Wt 138 lb 6.4 oz (62.778 kg)  LMP Postmenopausal  General appearance: healthy, alert, well developed, well nourished  Eyes: conjunctivae/corneas clear.  EOM's intact.   Skin: skin color, texture, turgor are normal  Head: Normocephalic. No masses, lesions, tenderness or abnormalities  Ears: External ears normal. Canals clear. TM's normal with some decrease in light reflex.  Oropharynx: Lips, mucosa, and tongue normal. Teeth and gums normal. Oropharynx moist and without lesion  Neck: Neck supple. No adenopathy. Thyroid symmetric, normal size, and without nodularity  Lungs: Lungs clear to auscultation bilaterally  Heart:  RRR. No murmurs, clicks, gallops or rubs  Abdomen: Abdomen soft, non-tender.  BS normal. No masses, no organomegaly  Extremities: Extremities normal. No deformities, edema, or skin discoloration    Health Counseling:  Smoking:  Reviewed and Discussed and Patient is trying to encourage her ex-husband who she lives with to quit smoking in the home but she is unable. She feels that she  should be around the house to help him with his medical problems as well.     ASSESSMENT/PLAN:  Chronic Cough for 2 months: Given patient's isolated nonproductive cough, patient's heavy exposure to smoke and no evidence of GERD, asthma, or postnasal drip we will get a chest x-ray to rule out lung cancer and PFT's to rule out COPD.     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 She sometimes feels numbness during the day. Patient advised that splinting her hand and taking Ibuprofen would both be helpful.     Hypothyroidism: Patient is currently asymptomatic. Patient will get a TSH level drawn today because previous one was elevated. Adjust medication of levothyroxine as needed based on results.     Health Maintenance:  Patient has been scheduled for mammography  She has had a pap within the last 3 years  She has had a colonoscopy a few years ago.  Lipid screening to be done today

## 2008-11-10 NOTE — Progress Notes (Signed)
After obtaining informed consent, the VIS and immunization was given.

## 2008-11-10 NOTE — Progress Notes (Signed)
Cc: routine cpe/ cough/ thyroid    SUBJECTIVE:  Hm:  Needs dexa scan.   Mammogram booked.  Pap next year  Colonoscopy: done approx 2 years ago    Reviewed medical student note: cough main acute complaint.   Pt having cough for over 2 months. Dry nonproductive and throughout day. Mild cough. Does not disturb sleep. No shortness of breath, fevers, chills, night sweats, weight loss.     Hx of TB but resolved per patient hx. Last ppd pt notes to be negative.  Hx of chronic smoke exposure. Husband smokes a lot in the house. Pt blames husband for children's need to use inhalers.    SUBJECTIVE: 62 year old female for annual routine pap and checkup. I have  fully reviewed the past medical, surgical, social and family history and updated the Histories section of EpicCare.    No LMP date recorded. Reason: Postmenopausal.  Current outpatient prescriptions prior to encounter:  SYNTHROID 150 MCG OR TABS 1 TABLET DAILY ;  NO substitution Disp: 30 Rfl: 12   FOSAMAX 70 MG OR TABS 1 TABLET WEEKLY ON AN EMPTY STOMACH Disp: 4 Rfl: 12   CALTRATE 600 + D 600-200 MG-IU OR TABS 1 TABLET twice daily Disp: 60 Rfl: 12   CENTRUM SILVER OR TABS 1 TABLET DAILY Disp: 30 Rfl: 12       Allergies: Strawberry    ROS:  No TIA's or unusual headaches, no dysphagia.  No dyspnea or chest pain on exertion.  No abdominal pain,  change in bowel habits, black or bloody stools.  No urinary tract  symptoms.    Gyne Review of Systems: post menopause    OBJECTIVE:   BP 134/88  Temp (Src) 98.2 F (36.8 C) (Oral)  Ht 5\' 5"  (1.651 m)  Wt 138 lb 6.4 oz (62.778 kg)  LMP Postmenopausal  HEAD AND NECK:  Ears normal.  Throat, oral cavity and  tongue normal.  Neck supple. No adenopathy or masses in the neck or  supraclavicular regions.  No carotid bruits. No thyromegaly. NEURO:  Cranial nerves and fundi are normal. Neck supple. DTR's normal and  symmetric.  CHEST:  Clear, good air entry, no wheezes, rhonci or  rales. HEART:  S1 and S2 normal, no murmurs, clicks,  gallops or  rubs.  Regular rate and rhythm.  No edema or JVD. ABDOMEN:  Soft  without tenderness, guarding, mass or organomegaly.  No CVA  tenderness or inguinal adenopathy. EXTREMITIES:  Extremities,  reflexes and peripheral pulses are normal.  SKIN:  No rashes or  suspicious skin lesions noted.    V70.0 Routine General Medical Examination at a Health Care Facility  (primary encounter diagnosis)  Comment:   Plan: Hm:  Needs dexa scan.   Mammogram booked.  Pap next year  Colonoscopy: done approx 2 years ago    244.9 HYPOTHYROIDISM NOS  Plan: SYNTHROID 150 MCG OR TABS, THYROID STIM         HORMONE, ROUTINE VENIPUNCTURE,  FLU VACC SPLIT         VIRUS 3 & > (PUBLIC)    786.2 Cough  Xray pa/lat  Plan:  FLU VACC SPLIT VIRUS 3 & > (PUBLIC)    V77.91D Lipid Screening  Plan: ASSAY, BLD/SERUM CHOLESTEROL, ASSAY OF         LIPOPROTEIN (HDL), ASSAY OF BLOOD LIPOPROTEIN         (LDL), COMPREHENSIVE METABOLIC PANEL

## 2008-11-11 MED ORDER — LEVOTHYROXINE SODIUM 175 MCG PO TABS
ORAL_TABLET | ORAL | Status: DC
Start: 2008-11-11 — End: 2009-10-10

## 2008-11-15 ENCOUNTER — Ambulatory Visit: Payer: Self-pay

## 2008-11-15 LAB — XR CHEST 2 VIEWS

## 2008-11-16 LAB — MA SCREENING MAMMO BILATERAL WITH CAD

## 2009-10-10 ENCOUNTER — Ambulatory Visit (HOSPITAL_BASED_OUTPATIENT_CLINIC_OR_DEPARTMENT_OTHER): Payer: Medicaid Other

## 2009-10-10 ENCOUNTER — Encounter (HOSPITAL_BASED_OUTPATIENT_CLINIC_OR_DEPARTMENT_OTHER): Payer: Self-pay

## 2009-10-10 VITALS — BP 120/78 | Temp 97.8°F | Ht 65.0 in | Wt 148.1 lb

## 2009-10-10 DIAGNOSIS — M949 Disorder of cartilage, unspecified: Secondary | ICD-10-CM

## 2009-10-10 DIAGNOSIS — R040 Epistaxis: Secondary | ICD-10-CM

## 2009-10-10 DIAGNOSIS — Z1239 Encounter for other screening for malignant neoplasm of breast: Secondary | ICD-10-CM

## 2009-10-10 DIAGNOSIS — Z0000 Encounter for general adult medical examination without abnormal findings: Secondary | ICD-10-CM

## 2009-10-10 DIAGNOSIS — M899 Disorder of bone, unspecified: Secondary | ICD-10-CM

## 2009-10-10 DIAGNOSIS — E039 Hypothyroidism, unspecified: Secondary | ICD-10-CM

## 2009-10-10 MED ORDER — CENTRUM SILVER PO TABS
ORAL_TABLET | ORAL | Status: AC
Start: 2009-10-10 — End: 2010-10-10

## 2009-10-10 MED ORDER — LEVOTHYROXINE SODIUM 175 MCG PO TABS
ORAL_TABLET | ORAL | Status: DC
Start: 2009-10-10 — End: 2009-12-20

## 2009-10-10 MED ORDER — CALCIUM CARBONATE-VITAMIN D 600-400 MG-UNIT PO TABS
ORAL_TABLET | ORAL | Status: DC
Start: 2009-10-10 — End: 2010-02-08

## 2009-10-10 MED ORDER — FOSAMAX 70 MG PO TABS
ORAL_TABLET | ORAL | Status: DC
Start: 2009-10-10 — End: 2011-02-21

## 2009-10-10 NOTE — Progress Notes (Signed)
Cc: routine physical as below    Off medications for past two months.  Cramping in legs.  Coughing and some lung pain on right side.  Epistaxis- dry heat in the house. Husband is 62 yo and chain smoker.  SUBJECTIVE: 63 year old female for annual routine pap and checkup. I have  fully reviewed the past medical, surgical, social and family history and updated the Histories section of EpicCare.    No LMP recorded.  Current outpatient prescriptions ordered prior to encounter:  CENTRUM SILVER OR TABS 1 TABLET DAILY Disp: 30 tablet Rfl: 12   DISCONTD: LEVOTHYROXINE SODIUM 175 MCG OR TABS 1 TABLET DAILY Disp: 30 Rfl: 11   CALTRATE 600 + D 600-200 MG-IU OR TABS 1 TABLET twice daily Disp: 60 Rfl: 12   DISCONTD: FOSAMAX 70 MG OR TABS 1 TABLET WEEKLY ON AN EMPTY STOMACH Disp: 4 Rfl: 12   DISCONTD: CENTRUM SILVER OR TABS 1 TABLET DAILY Disp: 30 Rfl: 12       Allergies: Strawberry    ROS:  No TIA's or unusual headaches, no dysphagia. No prolonged  cough. No dyspnea or chest pain on exertion.  No abdominal pain,  change in bowel habits, black or bloody stools.  No urinary tract  symptoms.    Gyne Review of Systems: post menopause    OBJECTIVE:   BP 120/78  Temp(Src) 97.8 F (36.6 C) (Oral)  Ht 5\' 5"  (1.651 m)  Wt 148 lb 2 oz (67.189 kg)  HEAD AND NECK:  Ears normal.  Throat, oral cavity and  tongue normal.  Neck supple. No adenopathy or masses in the neck or  supraclavicular regions.  No carotid bruits. No thyromegaly. NEURO:  Cranial nerves and fundi are normal. Neck supple. DTR's normal and  symmetric.  CHEST:  Clear, good air entry, no wheezes, rhonci or  rales. HEART:  S1 and S2 normal, no murmurs, clicks, gallops or  rubs.  Regular rate and rhythm.  No edema or JVD. ABDOMEN:  Soft  without tenderness, guarding, mass or organomegaly.  No CVA  tenderness or inguinal adenopathy. EXTREMITIES:  Extremities,  reflexes and peripheral pulses are normal.  SKIN:  No rashes or  suspicious skin lesions  noted.    A/p  V70.0 Routine general medical examination at a health ca  (primary encounter diagnosis)  Comment:   Plan: pt is receiving chronic passive cigarette smoke from her chain smoker husband. Risks discussed. She does have a dry cough and c/o "lung pain". Seems muscular on clinical exam but advised cxray. Pt agrees. Will rtc in 6 weeks for another thyroid test and will check at that time.    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 Advised humidifier and vaseline but due to the extent and chronicity will have her see ent for further eval  Plan: REFERRAL TO ENT (INT)            V04.81R Need for influenza vaccine  Comment:   Plan: INFLUENZA VIRUS VACCINE SPLIT VIRUS 3 YEARS +         IM            V76.10K Screening for breast cancer  Comment:   Plan: ORDER FOR MAMMOGRAM (SCREENING)

## 2009-10-23 ENCOUNTER — Encounter (HOSPITAL_BASED_OUTPATIENT_CLINIC_OR_DEPARTMENT_OTHER): Payer: Self-pay

## 2009-11-16 ENCOUNTER — Ambulatory Visit (HOSPITAL_BASED_OUTPATIENT_CLINIC_OR_DEPARTMENT_OTHER): Payer: Medicaid Other | Admitting: Ent-Otolaryngology

## 2009-11-16 ENCOUNTER — Ambulatory Visit: Payer: Self-pay

## 2009-11-16 VITALS — BP 150/88 | HR 92 | Temp 97.6°F

## 2009-11-16 DIAGNOSIS — R040 Epistaxis: Secondary | ICD-10-CM

## 2009-11-16 NOTE — Progress Notes (Signed)
HPI:  63 year old female patient with right epistaxis for 2-3 months.  Consultation requested by Dr. Ubaldo Glassing.  Cauterized on right at [facility].  No nasal obstruction.  No nasal surgery.  No bleeding disorder.  No ASA.    Past medical, social and family history reviewed in EPIC.    REVIEW OF SYSTEMS  Fevers:  No  Blurred Vision:  No  Palpitations:  No  Cough:  No  Heartburn:  No  Problems with Urination:  No  Stiff Neck:  No  Headache:  No  Fullness of the lower neck:  No  Easy Bruising:  No  Itchy eyes:  No  Claustrophobia:  No  Skin lesions of the face:  No    Physical Exam:    General: Well appearing female in no distress  Voice: No hoarseness  Resp: No respiratory distress. No stridor  Face: No asymmetry, sinus tenderness or abnl lesions.  AD:   Wax none / minimal.  EAC clear.  No auricle lesion. TM clear and mobile.  AS:   Wax none / minimal.  EAC clear.  No auricle lesion. TM clear and mobile  NC/NP: Septum midline.  Small area of erythema right anterior septum. Turbinates normal size.  No discharge.  NASOPHARYNX:  clear, no masses  OC/OP:  Tonsils 0. No erythema or exudate.  No tongue lesions. FOM normal. Mucosa moist.  Neck: Supple. Trachea midline.  FROM.  No palpable adenopathy.  SMGs and parotids normal size.  No palpable thyroid nodule.  Larynx/Hypopharynx:  Epiglottis no edema or lesions.  Base of tongue no masses.  Piriform sinuses clear.  Vocal cords:  CNV.  Neuro: Alert and oriented X3.  CNs grossly intact.  No nystagmus.    Procedure: The nasal cavity was anesthetized with 2% lidocaine and 0.025 oxymetazline mixture.  The right anterior septum was cauterized with silver nitrate.  Bacitracin was applied.  There was no bleeding and pt tolerated procedure.        A/P  63 year old female with right epistaxis.  Right anterior septum cauterized.  Nasal endoscopy otherwise negative.  Nasal saline and vaseline.  F/u prn.

## 2009-11-16 NOTE — Nursing Note (Signed)
>>   Beth Hudson     Thu Nov 16, 2009 10:42 AM  Nosebleeds lasting about 5 min threee times a week

## 2009-11-21 LAB — MA SCREENING MAMMO BILATERAL WITH CAD

## 2009-12-20 ENCOUNTER — Encounter (HOSPITAL_BASED_OUTPATIENT_CLINIC_OR_DEPARTMENT_OTHER): Payer: Self-pay

## 2009-12-20 ENCOUNTER — Ambulatory Visit (HOSPITAL_BASED_OUTPATIENT_CLINIC_OR_DEPARTMENT_OTHER): Payer: Medicaid Other

## 2009-12-20 ENCOUNTER — Telehealth (HOSPITAL_BASED_OUTPATIENT_CLINIC_OR_DEPARTMENT_OTHER): Payer: Self-pay

## 2009-12-20 VITALS — BP 154/72 | HR 83 | Temp 96.8°F | Ht 65.0 in | Wt 143.5 lb

## 2009-12-20 DIAGNOSIS — E039 Hypothyroidism, unspecified: Secondary | ICD-10-CM

## 2009-12-20 DIAGNOSIS — R300 Dysuria: Secondary | ICD-10-CM

## 2009-12-20 DIAGNOSIS — L659 Nonscarring hair loss, unspecified: Secondary | ICD-10-CM

## 2009-12-20 LAB — URINE DIP (POINT OF CARE)
BILIRUBIN, URINE: NEGATIVE (ref 0–0)
GLUCOSE, URINE: NEGATIVE mg/dl (ref 0–0)
KETONE, URINE: NEGATIVE mg/dl (ref 0–0)
NITRITE, URINE: NEGATIVE
PH URINE: 5.5 (ref 5.0–8.0)
PROTEIN, URINE: NEGATIVE mg/dl (ref 0–15)
SPECIFIC GRAVITY URINE: 1.015 (ref 1.003–1.030)
UROBILINOGEN URINE: 0.2 mg/dl (ref 0.2–1.0)

## 2009-12-20 LAB — TSH (THYROID STIMULATING HORMONE): TSH (THYROID STIM HORMONE): 0.02 u[IU]/mL — ABNORMAL LOW (ref 0.34–5.60)

## 2009-12-20 MED ORDER — LEVOTHYROXINE SODIUM 150 MCG PO TABS
ORAL_TABLET | ORAL | Status: DC
Start: 2009-12-20 — End: 2011-02-21

## 2009-12-20 MED ORDER — BACTRIM DS 800-160 MG PO TABS
ORAL_TABLET | ORAL | Status: AC
Start: 2009-12-20 — End: 2009-12-27

## 2009-12-20 MED ORDER — MINOXIDIL 2 % EX SOLN
CUTANEOUS | Status: DC
Start: 2009-12-20 — End: 2009-12-28

## 2009-12-20 NOTE — Telephone Encounter (Signed)
Could you please let patient know that her current thyroid test shows that her medication is too strong. We need to decrease dose. New med sent to [facility] pharmacy. Thanks!

## 2009-12-20 NOTE — Progress Notes (Signed)
HEALTHY HEART PROGRAM -  PHYSICAL ACTIVITY PLAN 2    Congratulations. You said you are ready to increase your physical activity. You are taking a big step toward improving your health. Let's work together to create an activity program for you.    What are the two main benefits you hope to get from being active?      1. Prevent heart diseases                                   2. Prevent diabetes      MAKE A PHYSICAL ACTIVITY PLAN  Let's look at the "Examples of Activities" below. Which one do you enjoy most? Can you do it all year? Sometimes it helps to have a second activity program as a back-up.    Examples of Activities  aWalking (to work, during lunch, to do errands)  aDancing (at home, in a class)  Personnel officer housework  aYardwork  aExercises (on your own or with a video)  aPlaying with children or pets  aClimbing stairs  aJumping rope  aJogging  aBicycle  aAerobics  aSwimming laps  [employer] or volleyball  [employer] at home    Climbing stairs is an activity you can do all year round.    Remember that you goal is to do a physical activity                                                              for at least 30 minutes on most days.    Type of Activity: walking    Where will you do your activity? At home? In the neighborhood? At the park? At a gym?    Place of Activity: street    What time of day will you do your activity?    Day and Times of Activity: 4 times per week    How long will you do your activity each time? You should build up time gradually over several weeks.    Length of Activity:    How long will you do your new activity program? Is is good to have someone to be active with, or just to encourage you.    Who will help you and how? My self when i am leaving my home early to walk more      How confident are you that you will do regular physical activity for the next 3 months? VERY    If you chose "not at all" or "somewhat," what can you do to improve your confidence?          RISK REDUCTION  EDUCATOR USE ONLY  Risk Reduction Educator Recommendations      I recommend you do the following physical activity to improve your health:        Frequency: 4 times per week      Type: street (type of physical activity)      Time: 35 minutes per session       I agree to try out this physical activity plan from 11/2009 to 10/2010        ____________________________________  _________________    Ross Marcus                                                                                                DATE         ______________________________________                     __________________  RISK REDUCTION EDUCATOR SIGNATURE                                                              DATE

## 2009-12-20 NOTE — Progress Notes (Signed)
Cc: bald spot/ pain with urination    SUBJECTIVE:  64 year old female presents due to hair thinning. She has noticed over past two to three months significant hair thinning. Then her daughter noticed a bald spot in the back of her head. Patient admits to compliance at this time with thyroid medications but her last check was very high and did not return for f/u TSH. Past three checks were not optimal.    THYROID STIM HORMONE (uIU/mL)   Date     Date  Value    11/10/2008  14.26*    07/09/2006  8.29*    04/07/2006  8.66*   ----------    2. Also  complains of urinary frequency, urgency and dysuria x 2 days, without flank pain, fever, chills, or abnormal vaginal discharge or bleeding.     Ros:  Denies fevers, chills, nausea, vomitting, diarrhea, abdominal pain, chest pain, shortness of breath, rashes or lower extremity edema.      Past Medical History    Unspecified pyelonephritis     Comment: None for 28 yrs.    HYPOTHYROIDISM NOS 09/22/2005    ANEMIA NOS 09/22/2005    Epistaxis 09/17/2005    Comment: Patient required cauterization twice, had severe episode and dx. With anemia; currently f/u at [facility]    BONE & CARTILAGE DIS NOS 09/17/2005    Comment: Bone Density 08/06 Osteopenia, Rx. Fosamax.         Past Surgical History    LIG/TRNSXJ FLP TUBE DONE TM C DLVR/SURG     APPENDEC     SCOPE OF COLON THRU OSTOMY FOR DIAGNOSIS 09/22/00    ESPHGSC RGD/FLX DX +-COLLJ SPEC BR/WA SPX 07/21/00    Comment EGD         Current outpatient prescriptions:  sulfamethoxazole-trimethoprim (BACTRIM DS) 800-160 MG TABS 1 TABLET EVERY 12 HOURS Disp: 14 tablet Rfl: 0   Minoxidil 2 % SOLN apply every night to scalp Disp: 100 mL Rfl: 2   LEVOTHYROXINE SODIUM 175 MCG OR TABS 1 TABLET DAILY Disp: 30 tablet Rfl: 11   FOSAMAX 70 MG OR TABS 1 TABLET WEEKLY ON AN EMPTY STOMACH Disp: 4 tablet Rfl: 12   CALCIUM CARBONATE-VITAMIN D 600-400 MG-UNIT OR TABS 1 tablet twice a day Disp: 60 tablet Rfl: 12   CENTRUM SILVER OR TABS 1 TABLET  DAILY Disp: 30 tablet Rfl: 12   CALTRATE 600 + D 600-200 MG-IU OR TABS 1 TABLET twice daily Disp: 60 Rfl: 12   DISCONTD: CENTRUM SILVER OR TABS 1 TABLET DAILY Disp: 30 Rfl: 12       Review of Patient's Allergies indicates:   Strawberry              Rash    Comment:Unable to eat fresh strawberries, ok with the rest    Social History    Marital Status: Married             Spouse Name: Lars Mage                  Years of Education:                 Number of children: 4             Occupational History  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 For 2 years a pack a week. 40                 years ago.     Alcohol Use: No              Drug Use: No              Sexual Activity: Not Currently     Partners with: Female       Comment: 4 children all in [place]. 1 son, 3                 daughters.    Other Topics            Concern  Sleep Concern           No  Stress Concern          No  Weight Concern          No  Special Diet            No    Social History Narrative    Live with ex-husband with end stage Lung Dz. And son age 70. She has 3 daughters ages 34,34,32. She worked at [employer]. Currently unemployed but looking for a job. Currently does some housecleaning every other week.         Family History    Heart Mother    Comment: HX. CABG    Hypertension Mother    Renal Mother    Comment: died 36, died of renal failure    Stroke Sister    Comment: age 5, now 37    Stroke Father    Comment: died age 52    Cancer - Other Brother    Comment: In his back, ? Bone, doing well       BP 154/72  Pulse 83  Temp(Src) 96.8 F (36 C) (Temporal)  Ht 5\' 5"  (1.651 m)  Wt 143 lb 8 oz (65.091 kg)  SpO2 99%  gen awake happy in nad comfortable and  talkative.  Heart rrr  Lungs cta b/l  abd soft nt/nd, no flank/cva tenderness   Scalp: one well circumferenced area at base of scalp/neck -occiput of alopecia. Top of head has some mild thinning.    A/p  704.22M Hair thinning  (primary encounter diagnosis)  Most likely due to noncompliance of hypothyroidism meds as seen in her last three checks. Will start minoxidil. For the one patch of alopecia (pt concerned because going to a wedding and visibly upset over bald spot) will send to Dr. Sherryll Burger for corticosteriod injection.   Plan: Minoxidil 2 % SOLN        Over 50% of 25 min visit spent counselling pt on above    788.1 Dysuria  Comment:   Plan: URINE DIP (  POINT OF CARE),         sulfamethoxazole-trimethoprim (BACTRIM DS)         800-160 MG TABS            244.9 HYPOTHYROIDISM NOS  Comment:   Plan: THYROID STIM HORMONE

## 2009-12-21 NOTE — Telephone Encounter (Addendum)
T/c to pt . I reviewed recent labs with her. I asked her to come back in 3 months for a repeat TSH . I will place a future order & pend a lab appt  For her. She will p/u RX tomorrow.

## 2009-12-28 ENCOUNTER — Ambulatory Visit (HOSPITAL_BASED_OUTPATIENT_CLINIC_OR_DEPARTMENT_OTHER): Payer: Medicaid Other | Admitting: Family Medicine

## 2009-12-28 ENCOUNTER — Encounter (HOSPITAL_BASED_OUTPATIENT_CLINIC_OR_DEPARTMENT_OTHER): Payer: Self-pay

## 2009-12-28 VITALS — HR 105 | Temp 97.8°F | Wt 142.0 lb

## 2009-12-28 DIAGNOSIS — L639 Alopecia areata, unspecified: Secondary | ICD-10-CM

## 2009-12-28 DIAGNOSIS — E039 Hypothyroidism, unspecified: Secondary | ICD-10-CM

## 2009-12-28 NOTE — Progress Notes (Signed)
SUBJECTIVE:   Beth Hudson is a 65 year old female who presents with compliant of bald spot on back of scalp.  It was noticed by her daughter a couple weeks ago, but otherwise, patient is not sure how long it has been present.  There is general scalp itching, and maybe moreso right at the bald spot.  Also with history of general hair thinning over the past few years.  She was prescribed Minoxidil; but never got the medication, due to high cost ($30).  Pt denies any increased stress or other medical issue.    She has a known history of thyroid disease and had a TSH on 1/26 showing over supplementation.  PCP has noted this and adjusted the medication.  Pt is aware and will return in April for another blood test.    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  Vitals reviewed; pt noted to by tachycardic today.  Scalp: there is a quarter-size area on the scalp which is balding; although, there are scattered fine hairs (~12 of them that I count) within the spot.  Scalp is erythematous, but no obvious signs of any abnormal scalp disease.  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  Informational handout about alopecia given to pt.  -- discussed possible treatment options or the option of doing nothing as perhaps hair is already starting to grow in, and/or waiting until TSH has normalized  -- pt opted to do triamcinolone injections.  After site was prepped with alcohol, 2 separate injections into the bald spot were done using a 22 gauge needle and a total of 1 ML (10 MG  Kenalog).  Hemostasis achieved with pressure alone.  -- f/u in 4-6 weeks.    244.9 HYPOTHYROIDISM NOS  -- continue synthroid as PCP instructed  -- f/u in April for repeat TSH

## 2010-01-25 ENCOUNTER — Encounter (HOSPITAL_BASED_OUTPATIENT_CLINIC_OR_DEPARTMENT_OTHER): Payer: Self-pay | Admitting: Family Medicine

## 2010-01-25 ENCOUNTER — Ambulatory Visit (HOSPITAL_BASED_OUTPATIENT_CLINIC_OR_DEPARTMENT_OTHER): Payer: Medicaid Other | Admitting: Family Medicine

## 2010-01-25 VITALS — BP 130/70 | HR 105 | Temp 97.6°F | Ht 65.0 in | Wt 144.0 lb

## 2010-01-25 DIAGNOSIS — L639 Alopecia areata, unspecified: Secondary | ICD-10-CM

## 2010-01-25 NOTE — Progress Notes (Signed)
SUBJECTIVE:   Beth Hudson is a 64 year old female who presents for follow-up of alopecia areata after scalp injections with triamcinolone were done last month.  Patient tolerated injection well and denied resultant pain or other abnormal symptoms.      OBJECTIVE:  Scalp:  Scalp without any erythema, lesions or flaking.  The area of balding from last months exam reveals multiple areas of hair growth within.  Growth is typical and appropriate    ASSESSMENT:/.PLAN:   History of alopecia areata, s/p triamcinolone injection in the balding spot with improvement in hair growth noted  -- no further treatment recommended at this time  -- f/u as needed

## 2010-02-08 ENCOUNTER — Other Ambulatory Visit (HOSPITAL_BASED_OUTPATIENT_CLINIC_OR_DEPARTMENT_OTHER): Payer: Self-pay

## 2010-02-08 NOTE — Telephone Encounter (Signed)
[facility]    Person calling on behalf of patient: Patient  May list multiple medications in this section  Medicine Name: CALCIUM CARBONATE-VITAMIN D   Dosage: 600 mg  Frequency (how many pills, how many times a day): BID  Number of pills left:none  Documented patient preferred pharmacies:  [facility] [place] [phone] Fax: [phone]  Pharmacy Name: [facility]  Pharmacy Telephone Number: [phone]  Pharmacy  Fax Number: [phone]    CALL BACK NUMBER: [phone]  Cell phone:   Other phone:    Available times:    Patient's language of care: English    Patient does not need an interpreter.

## 2010-02-08 NOTE — Telephone Encounter (Signed)
Beth Hudson is a 64 year old female has requested a refill of Calcium +D 600/400mg   Other Med Adult:  Most Recent BP Reading(s)  01/25/2010 : 130/70        Cholesterol (mg/dl)   Date     Date  Value    11/10/2008  208*   ----------    LDL (mg/dl)   Date     Date  Value    11/10/2008  121*   ----------    HDL (mg/dl)   Date     Date  Value    11/10/2008  46    ----------    TRIGLYCERIDES (mg/dl)   Date     Date  Value    07/09/2006  163*   ----------        THYROID SCREEN TSH (uIU/mL)   Date     Date  Value    08/10/2007  11.94*   ----------        THYROID STIM HORMONE (uIU/mL)   Date     Date  Value    12/20/2009  0.02*   ----------      No results found for this basename: hgba1c:1        No results found for this basename: INR:3       Documented patient preferred pharmacies:  [facility] [place] [phone] Fax: [phone]

## 2010-02-09 MED ORDER — CALCIUM CARBONATE-VITAMIN D 600-400 MG-UNIT PO TABS
1.0000 | ORAL_TABLET | Freq: Two times a day (BID) | ORAL | Status: DC
Start: 2010-02-08 — End: 2011-02-21

## 2011-02-13 ENCOUNTER — Encounter (HOSPITAL_BASED_OUTPATIENT_CLINIC_OR_DEPARTMENT_OTHER): Payer: Self-pay

## 2011-02-21 ENCOUNTER — Ambulatory Visit (HOSPITAL_BASED_OUTPATIENT_CLINIC_OR_DEPARTMENT_OTHER): Payer: Medicaid Other

## 2011-02-21 ENCOUNTER — Encounter (HOSPITAL_BASED_OUTPATIENT_CLINIC_OR_DEPARTMENT_OTHER): Payer: Self-pay

## 2011-02-21 VITALS — BP 130/82 | HR 85 | Temp 98.0°F | Ht 64.6 in | Wt 146.0 lb

## 2011-02-21 DIAGNOSIS — Z131 Encounter for screening for diabetes mellitus: Secondary | ICD-10-CM

## 2011-02-21 DIAGNOSIS — Z0000 Encounter for general adult medical examination without abnormal findings: Secondary | ICD-10-CM

## 2011-02-21 DIAGNOSIS — M899 Disorder of bone, unspecified: Secondary | ICD-10-CM

## 2011-02-21 DIAGNOSIS — Z13220 Encounter for screening for lipoid disorders: Secondary | ICD-10-CM

## 2011-02-21 DIAGNOSIS — Z1151 Encounter for screening for human papillomavirus (HPV): Secondary | ICD-10-CM

## 2011-02-21 DIAGNOSIS — M949 Disorder of cartilage, unspecified: Secondary | ICD-10-CM

## 2011-02-21 DIAGNOSIS — Z124 Encounter for screening for malignant neoplasm of cervix: Secondary | ICD-10-CM

## 2011-02-21 LAB — CHLAMYDIA GC NAAT
GENPROBE CHLAMYDIA: NEGATIVE
GENPROBE GC: NEGATIVE

## 2011-02-21 MED ORDER — LEVOTHYROXINE SODIUM 150 MCG PO TABS
150.0000 ug | ORAL_TABLET | Freq: Every day | ORAL | Status: DC
Start: 2011-02-21 — End: 2012-05-25

## 2011-02-21 MED ORDER — CALCIUM CARBONATE-VITAMIN D 600-400 MG-UNIT PO TABS
1.0000 | ORAL_TABLET | Freq: Two times a day (BID) | ORAL | Status: AC
Start: 2011-02-21 — End: 2012-02-21

## 2011-02-21 MED ORDER — ALENDRONATE SODIUM 70 MG PO TABS
70.0000 mg | ORAL_TABLET | ORAL | Status: DC
Start: 2011-02-21 — End: 2012-05-25

## 2011-02-21 NOTE — Progress Notes (Signed)
SUBJECTIVE: 65 year old female for annual routine pap and checkup. I have  fully reviewed the past medical, surgical, social and family history and updated the Histories section of EpicCare.      Past Medical History    Pyelonephritis, unspecified     Comment: None for 28 yrs.    HYPOTHYROIDISM NOS 09/22/2005    ANEMIA NOS 09/22/2005    Epistaxis 09/17/2005    Comment: Patient required cauterization twice, had severe episode and dx. With anemia; currently f/u at [facility]    BONE & CARTILAGE DIS NOS 09/17/2005    Comment: Bone Density 08/06 Osteopenia, Rx. Fosamax.           Past Surgical History    LIG/TRNSXJ FLP TUBE DONE TM C DLVR/SURG     APPENDEC     SCOPE OF COLON THRU OSTOMY FOR DIAGNOSIS 09/22/00    ESPHGSC RGD/FLX DX +-COLLJ SPEC BR/WA SPX 07/21/00    Comment EGD         No LMP recorded. Patient is postmenopausal.  Current outpatient prescriptions ordered prior to encounter:  FOSAMAX 70 MG OR TABS 1 TABLET WEEKLY ON AN EMPTY STOMACH Disp: 4 tablet Rfl: 12   CENTRUM SILVER OR TABS 1 TABLET DAILY Disp: 30 tablet Rfl: 12   CALTRATE 600 + D 600-200 MG-IU OR TABS 1 TABLET twice daily Disp: 60 Rfl: 12   DISCONTD: CENTRUM SILVER OR TABS 1 TABLET DAILY Disp: 30 Rfl: 12       Allergies: Strawberry    ROS:  No TIA's or unusual headaches, no dysphagia. No prolonged  cough. No dyspnea or chest pain on exertion.  No abdominal pain,  change in bowel habits, black or bloody stools.  No urinary tract  symptoms.    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              For 2 years a pack a week. 40 years ago.     Alcohol Use: No              Drug Use: No              Sexual Activity: Not Currently     Partners with: Female  Comment: 4 children all in [place]. 1 son, 3                 daughters.    Other Topics            Concern  Sleep Concern           No  Stress Concern          No  Weight Concern          No  Special Diet            No    Social History Narrative    Live with ex-husband with end stage Lung Dz. And son age 75. She has 3 daughters ages 29,34,32. She worked at [employer]. Currently unemployed but looking for a job. Currently does some housecleaning every other week.         Family History    Heart Mother     Comment: HX. CABG    Hypertension Mother     Renal Mother     Comment: died 51, died of renal failure    Stroke Sister     Comment: age 25, now 63    Stroke Father     Comment: died age 52    Cancer - Other Brother     Comment: In his back, ? Bone, doing well         Gyne Review of Systems: post meno      OBJECTIVE:  BP 130/82  Pulse 85  Temp(Src) 98 F (36.7 C) (Temporal)  Ht 5' 4.6" (1.641 m)  Wt 146 lb (66.225 kg)  BMI 24.60 kg/m2  SpO2 98%   HEAD AND NECK:  Ears normal.  Throat, oral cavity and  tongue normal.  Neck supple. No adenopathy or masses in the neck or  supraclavicular regions.  No carotid bruits. No thyromegaly. NEURO:  Cranial nerves and fundi are normal. Neck supple. DTR's normal and  symmetric.  CHEST:  Clear, good air entry, no wheezes, rhonci or  rales. HEART:  S1 and S2 normal, no murmurs, clicks, gallops or  rubs.  Regular rate and rhythm.  No edema or JVD. ABDOMEN:  Soft  without tenderness, guarding, mass or organomegaly.  No CVA  tenderness or inguinal adenopathy. EXTREMITIES:  Extremities,  reflexes and peripheral pulses are normal.  SKIN:  No rashes or  suspicious skin lesions noted.    Breast Exam: breasts symmetric, no dominant or suspicious mass, no skin or nipple changes, no axillary adenopathy  and self exam in taught and encouraged.  PELVIC:  External Genitalia: normal architecture  Vagina: well rugated and no lesions  Vaginal Discharge: normal appearing  Pelvic supports: normal  Cervix: no lesions  Uterus: anteverted, normal size and non-tender  Adnexa: no masses, nodularity, tenderness      A/p    V70.0 Routine general medical examination at a health care facility  (primary encounter diagnosis)  Comment:   Plan: FECAL OCCULT (POINT OF CARE) HOME TEST 3 CARDS            V73.81C Screening for HPV (human papillomavirus)  Comment:   Plan: CYTOPATH, C/V, THIN LAYER, HUMAN         PAPILLOMAVIRUS, AMPLIFIED GENPROBE CHLAM/GC,         CERV/VAG CANC SCRN,PELV/BREAST EXAM            V76.2 Screening for malignant neoplasm of the cervix  Comment:   Plan: OBTAINING SCREEN PAP SMEAR, CYTOPATH, C/V, THIN  LAYER, CERV/VAG CANC SCRN,PELV/BREAST EXAM          Refills given to this patient.   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

## 2011-02-26 LAB — CYTOPATH, C/V, THIN LAYER

## 2011-02-27 LAB — HUMAN PAPILLOMAVIRUS (HPV): HUMAN PAPILLOMAVIRUS: NEGATIVE

## 2011-03-06 ENCOUNTER — Ambulatory Visit: Payer: Self-pay

## 2011-03-07 LAB — MA SCREENING MAMMO BILATERAL WITH CAD

## 2011-04-29 ENCOUNTER — Telehealth (HOSPITAL_BASED_OUTPATIENT_CLINIC_OR_DEPARTMENT_OTHER): Payer: Self-pay

## 2011-04-29 NOTE — Telephone Encounter (Signed)
Tc to pt re: overdue fob cards. Spoke with pt she will bring cards in before week is over.

## 2011-05-14 ENCOUNTER — Ambulatory Visit (HOSPITAL_BASED_OUTPATIENT_CLINIC_OR_DEPARTMENT_OTHER): Payer: Medicaid Other | Admitting: Lab

## 2011-05-14 DIAGNOSIS — Z0000 Encounter for general adult medical examination without abnormal findings: Secondary | ICD-10-CM

## 2011-05-14 LAB — FECAL OCCULT (POINT OF CARE) HOME TEST 3 CARDS
LOT #: 711
OCCULT BLOOD: NEGATIVE
OCCULT BLOOD: NEGATIVE
OCCULT BLOOD: NEGATIVE

## 2011-05-14 NOTE — Progress Notes (Signed)
.    Fob cards received and resulted in epic.Marland Kitchen     Beth Hudson

## 2011-05-25 ENCOUNTER — Emergency Department (HOSPITAL_BASED_OUTPATIENT_CLINIC_OR_DEPARTMENT_OTHER)
Admission: RE | Admit: 2011-05-25 | Disposition: A | Discharge status: Home / Self Care | Payer: Self-pay | Source: Emergency Department | Attending: Emergency Medicine | Admitting: Emergency Medicine

## 2011-05-25 ENCOUNTER — Encounter (HOSPITAL_BASED_OUTPATIENT_CLINIC_OR_DEPARTMENT_OTHER): Payer: Self-pay | Admitting: Emergency Medicine

## 2011-05-25 LAB — URINE DIP (POINT OF CARE)
BILIRUBIN, URINE: NEGATIVE
GLUCOSE, URINE: NEGATIVE mg/dl
KETONE, URINE: NEGATIVE mg/dl
LEUKOCYTE ESTERASE: NEGATIVE
NITRITE, URINE: NEGATIVE
PH URINE: 6.5 (ref 5.0–8.0)
PROTEIN, URINE: NEGATIVE mg/dl (ref 0–15)
SPECIFIC GRAVITY URINE: 1.02 (ref 1.003–1.030)
UROBILINOGEN URINE: 0.2 mg/dl (ref 0.2–1.0)

## 2011-05-25 MED ORDER — OXYCODONE-ACETAMINOPHEN 5-325 MG PO TABS
1.00 | ORAL_TABLET | Freq: Once | ORAL | Status: AC
Start: 2011-05-25 — End: 2011-05-25
  Administered 2011-05-25: 1 via ORAL
  Filled 2011-05-25: qty 1

## 2011-05-25 MED ORDER — OXYCODONE-ACETAMINOPHEN 5-325 MG PO TABS
1.00 | ORAL_TABLET | ORAL | Status: AC | PRN
Start: 2011-05-25 — End: 2011-06-01

## 2011-05-25 NOTE — ED Notes (Signed)
Pt states pain had decreased with medication, feels ready to go home, ambulatory

## 2011-05-25 NOTE — ED Notes (Signed)
Patient Disposition    Patient education for diagnosis, medications, activity, diet and follow-up.  Patient left ED 9:13 AM.  Patient rep received written instructions.  Interpreter to provide instructions: yes    Discharged to: Discharged to home

## 2011-05-25 NOTE — Discharge Instructions (Signed)
At home rest, drink plenty of fluids.    Continue to take over-the-counter Aleve as needed.    Take the prescribed Percocet for breakthrough pain - this medication may make you drowsy; use appropriate caution and do not drive or operate machinery while taking this medication)    Follow up with your regular doctor; call in 2 days to schedule an appointment.    Return to the ER for worsening pain, fever, numbness/weakness, or for any other new concerns.

## 2011-05-25 NOTE — ED Provider Notes (Signed)
I have reviewed the ED nursing notes and prior records. I have reviewed the patient's past medical history/problem list, allergies, social history and medication list.    CHIEF COMPLAINT: back pain    HISTORY OF PRESENT ILLNESS:    The patient is a 65 year old woman with no significant past medical history who presents with a complaint of right lower back pain that began yesterday while sweeping the floor.  Symptoms are worsened with movement.  The pain does not radiate.  No distal numbness, paresthesias, weakness.  No abdominal pain.  No chest pain, no shortness of breath.  No dysuria or urinary frequency.    PAST MEDICAL HISTORY/PROBLEM LIST:    Past Medical History    Pyelonephritis, unspecified     Comment: None for 28 yrs.    HYPOTHYROIDISM NOS 09/22/2005    ANEMIA NOS 09/22/2005    Epistaxis 09/17/2005    Comment: Patient required cauterization twice, had severe episode and dx. With anemia; currently f/u at [facility]    BONE & CARTILAGE DIS NOS 09/17/2005    Comment: Bone Density 08/06 Osteopenia, Rx. Fosamax.       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 1 son, 3 daughters.     Other Topics Concern     Sleep Concern No    Stress Concern No    Weight Concern No    Special Diet No     Social History Narrative    Live with ex-husband with end stage Lung Dz. And son age 50. She has 3 daughters ages 62,34,32. She worked at [employer]. Currently unemployed but looking for a job. Currently does some housecleaning every other week.      MEDICATIONS:     No current facility-administered medications on file prior to encounter.  Current outpatient prescriptions ordered prior to encounter:  levothyroxine (SYNTHROID, LEVOTHROID) 150 MCG tablet Take 1 tablet by mouth daily. Disp: 30 tablet Rfl: 12   alendronate (FOSAMAX) 70 MG tablet Take 1 tablet by mouth once a week. ON AN EMPTY STOMACH. Disp: 4 tablet Rfl: 12   Calcium Carbonate-Vitamin D 600-400 MG-UNIT TABS Tablet Take 1 tablet by mouth 2 (two) times daily. Disp: 60 tablet Rfl: 12   CENTRUM SILVER OR TABS 1 TABLET DAILY Disp: 30 tablet Rfl: 12   CALTRATE 600 +  D 600-200 MG-IU OR TABS 1 TABLET twice daily Disp: 60 Rfl: 12   DISCONTD: CENTRUM SILVER OR TABS 1 TABLET DAILY Disp: 30 Rfl: 12         ALLERGIES:  Review of Patient's Allergies indicates:   Strawberry              Rash    Comment:Unable to eat fresh strawberries, ok with the rest    REVIEW OF SYSTEMS:  Pertinent positives were reviewed as per the HPI above. All other systems were reviewed and are negative.    PHYSICAL EXAM:  VITAL SIGNS:  Nursing documentation in EPIC reviewed.  BP 143/71  Pulse 71  Temp 98.1 F  Resp 16  Wt 65.772 kg  SpO2 98%  GENERAL APPEARANCE:  Awake, alert, no apparent distress.  HEENT:  Normocephalic, atraumatic.  Pupils equal, round, and reactive to light.  Extraocular motions are intact.  No scleral icterus.  Oropharynx is normal.  Mucous membranes are moist.  NECK:  Supple.  Full range of motion.  Nontender.  CHEST:  Lungs are clear to auscultation bilaterally.  Breath sounds are symmetric.  Respiratory effort is normal.  No respiratory distress.  CARDIOVASCULAR:  Regular, normal  S1 and S2.  ABDOMEN:  Bowel sounds are present, soft, nontender, nondistended.  BACK:  Right lower paravertebral muscular tenderness.  Pain is reproduced with rotation and flexion of the torso.   No CVA tenderness.  No bony tenderness.  EXTREMITIES:  Nontender, no edema, distal extremities are warm and appear well-perfused.  NEUROLOGIC:  Awake, alert, no focal neurologic deficit.  Normal gait.  SKIN:  Warm and dry.    MEDICAL DECISION-MAKING AND EMERGENCY DEPARTMENT COURSE:  Clinical impression:  Low back pain with no neurologic symptoms or signs on exam, likely muscular in nature.  The patient reports that she has been taking over the counter naproxen with minimal relief.  She will be given a prescription for percocet for breakthrough pain.    She was instructed to follow up with her primary care provider.  Reasons for return to the ER were reviewed.    DIAGNOSIS/DIAGNOSES:  724.5E Back pain    DISPOSITION:  Discharge in stable condition.    Verlon Setting, MD

## 2011-05-25 NOTE — ED Triage Note (Signed)
65 y/o female complains of nontraumatic R sided LBP for the past 2-3 days. She has used Aleve and Aspirin w/o pain relief.

## 2011-08-13 ENCOUNTER — Encounter (HOSPITAL_BASED_OUTPATIENT_CLINIC_OR_DEPARTMENT_OTHER): Payer: Self-pay

## 2012-05-25 ENCOUNTER — Ambulatory Visit (HOSPITAL_BASED_OUTPATIENT_CLINIC_OR_DEPARTMENT_OTHER): Payer: No Typology Code available for payment source

## 2012-05-25 ENCOUNTER — Encounter (HOSPITAL_BASED_OUTPATIENT_CLINIC_OR_DEPARTMENT_OTHER): Payer: Self-pay

## 2012-05-25 VITALS — BP 100/80 | HR 71 | Temp 97.0°F | Wt 146.2 lb

## 2012-05-25 DIAGNOSIS — Z8489 Family history of other specified conditions: Secondary | ICD-10-CM

## 2012-05-25 DIAGNOSIS — M949 Disorder of cartilage, unspecified: Secondary | ICD-10-CM

## 2012-05-25 DIAGNOSIS — M899 Disorder of bone, unspecified: Secondary | ICD-10-CM

## 2012-05-25 DIAGNOSIS — E039 Hypothyroidism, unspecified: Secondary | ICD-10-CM

## 2012-05-25 DIAGNOSIS — Z0000 Encounter for general adult medical examination without abnormal findings: Secondary | ICD-10-CM

## 2012-05-25 DIAGNOSIS — Z7189 Other specified counseling: Secondary | ICD-10-CM

## 2012-05-25 MED ORDER — LEVOTHYROXINE SODIUM 150 MCG PO TABS
150.0000 ug | ORAL_TABLET | Freq: Every day | ORAL | Status: DC
Start: 2012-05-25 — End: 2013-03-16

## 2012-05-25 MED ORDER — CALCIUM 600-200 MG-UNIT PO TABS
1.0000 | ORAL_TABLET | Freq: Two times a day (BID) | ORAL | Status: DC
Start: 2012-05-25 — End: 2013-07-27

## 2012-05-25 MED ORDER — ALENDRONATE SODIUM 70 MG PO TABS
70.0000 mg | ORAL_TABLET | ORAL | Status: DC
Start: 2012-05-25 — End: 2013-07-27

## 2012-05-25 NOTE — Progress Notes (Signed)
SUBJECTIVE: 66 year old female for annual routine pap and checkup. I have  fully reviewed the past medical, surgical, social and family history and updated the Histories section of EpicCare.    Patient here for physical.  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 She started to cry. States its overwhelming. She has not had anytime to herself for past 10 years (year 2000). 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 Recently he told her that he would like to get married again so that when he passes, she would get some benefits. She was happy but also saddened by the whole situation.   She takes care of him 24 hours/7 days a week. She has very happy and helpful children in the area but does not want to bother them with her situation.         Past Medical History    Pyelonephritis, unspecified     Comment: None for 28 yrs.    HYPOTHYROIDISM NOS 09/22/2005    ANEMIA NOS 09/22/2005    Epistaxis 09/17/2005    Comment: Patient required cauterization twice, had severe episode and dx. With anemia; currently f/u at [facility]    BONE & CARTILAGE DIS NOS 09/17/2005    Comment: Bone Density 08/06 Osteopenia, Rx. Fosamax.         Past Surgical History    LIG/TRNSXJ FALOPIAN TUBE CESAREAN DEL/ABDML SURG      APPENDECTOMY      COLONOSCOPY STOMA DX W/WO COLLJ SPEC SPX  09/22/00    ESPHGSC RGD/FLX DX W/WO COLLJ SPEC BR/WA SPX  07/21/00    Comment EGD       Smoking Status: Former Smoker                   Packs/Day: 0.00  Years:         Comment: 2nd hand smoking from husband whos is 64              years old and 2.5 packs a day in the house.              For 2 years a pack a week. 40 years ago.     Alcohol Use: No                Family History    Heart Mother     Comment: HX. CABG    Hypertension Mother     Renal Mother     Comment: died 91, died of renal failure    Stroke Sister     Comment: age 49, now 110    Stroke Father     Comment: died age  29    Cancer - Other Brother     Comment: In his back, ? Bone, doing well       No LMP recorded. Patient is postmenopausal.  Current Outpatient Prescriptions on File Prior to Visit:  DISCONTD: levothyroxine (SYNTHROID, LEVOTHROID) 150 MCG tablet Take 1 tablet by mouth daily. Disp: 30 tablet Rfl: 12   DISCONTD: alendronate (FOSAMAX) 70 MG tablet Take 1 tablet by mouth once a week. ON AN EMPTY STOMACH. Disp: 4 tablet Rfl: 12   EXPIRED: CENTRUM SILVER OR TABS 1 TABLET DAILY Disp: 30 tablet Rfl: 12   DISCONTD: CALTRATE 600 + D 600-200 MG-IU OR TABS 1 TABLET twice daily Disp: 60 Rfl: 12   DISCONTD: CENTRUM SILVER OR  TABS 1 TABLET DAILY Disp: 30 Rfl: 12   No current facility-administered medications on file prior to visit.  Allergies: Strawberry    ROS:  No TIA's or unusual headaches, no dysphagia. No prolonged  cough. No dyspnea or chest pain on exertion.  No abdominal pain,  change in bowel habits, black or bloody stools.  No urinary tract  symptoms.    Gyne Review of Systems: She denies abnormal vaginal bleeding, discharge or unusual pelvic pain, no dysuria, frequency or hematuria.      OBJECTIVE:  BP 100/80  Pulse 71  Temp(Src) 97 F (36.1 C) (Temporal)  Wt 146 lb 3.2 oz (66.316 kg)  BMI 24.63 kg/m2  SpO2 99%   HEAD AND NECK:  Ears normal.  Throat, oral cavity and  tongue normal.  Neck supple. No adenopathy or masses in the neck or  supraclavicular regions.  No carotid bruits. No thyromegaly. NEURO:  Cranial nerves and fundi are normal. Neck supple. DTR's normal and  symmetric.  CHEST:  Clear, good air entry, no wheezes, rhonci or  rales. HEART:  S1 and S2 normal, no murmurs, clicks, gallops or  rubs.  Regular rate and rhythm.  No edema or JVD. ABDOMEN:  Soft  without tenderness, guarding, mass or organomegaly.  No CVA  tenderness or inguinal adenopathy. EXTREMITIES:  Extremities,  reflexes and peripheral pulses are normal.  SKIN:  No rashes or  suspicious skin lesions noted.    A/p  (V70.0) Routine general  medical examination at a health care facility  (primary encounter diagnosis)  Comment:   Plan: routine screening discussed. Pt has been off of medications due to cut in insurance in March. Does not want blood tests today because will reflect her being off synthroid.     (V65.49) ACP (advance care planning)  Comment:   Plan: HEALTH CARE PROXY            (733.90) Disorder of bone and cartilage, unspecified  Comment:   Plan: alendronate (FOSAMAX) 70 MG tablet, Calcium         600-200 MG-UNIT per tablet            (244.9) Unspecified hypothyroidism  Comment:   Plan: restart medications.     Complex social situation. 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 She has been doing this for past 10 years. Did not know that there maybe compensation for it. She is requesting help at least 1-2 / week so that she can have time to herself.       We discussed the patient's current medications. The patient expressed understanding and no barriers to adherence were identified.    We discussed synthroid and the importance of medication compliance. The patient was ready to learn and no apparent learning barriers were identified. I explained the diagnosis and treatment plan, and the patient expressed understanding of the content. Possible side effects of the prescribed medication(s) were explained, including fatigue, palpitations.  I attempted to answer any questions regarding the diagnosis and the proposed treatment.

## 2012-05-26 ENCOUNTER — Encounter (HOSPITAL_BASED_OUTPATIENT_CLINIC_OR_DEPARTMENT_OTHER): Payer: Self-pay

## 2012-05-26 DIAGNOSIS — Z8489 Family history of other specified conditions: Secondary | ICD-10-CM | POA: Insufficient documentation

## 2012-06-01 ENCOUNTER — Encounter (HOSPITAL_BASED_OUTPATIENT_CLINIC_OR_DEPARTMENT_OTHER): Payer: Self-pay

## 2012-06-01 ENCOUNTER — Ambulatory Visit (HOSPITAL_BASED_OUTPATIENT_CLINIC_OR_DEPARTMENT_OTHER): Payer: MEDICARE

## 2012-06-01 VITALS — BP 140/90 | HR 90 | Temp 97.0°F | Ht 64.6 in | Wt 147.0 lb

## 2012-06-01 DIAGNOSIS — Z7722 Contact with and (suspected) exposure to environmental tobacco smoke (acute) (chronic): Secondary | ICD-10-CM

## 2012-06-01 DIAGNOSIS — Z8489 Family history of other specified conditions: Secondary | ICD-10-CM

## 2012-06-01 DIAGNOSIS — E039 Hypothyroidism, unspecified: Secondary | ICD-10-CM

## 2012-06-02 ENCOUNTER — Ambulatory Visit (HOSPITAL_BASED_OUTPATIENT_CLINIC_OR_DEPARTMENT_OTHER): Payer: MEDICARE

## 2012-06-02 DIAGNOSIS — Z7189 Other specified counseling: Secondary | ICD-10-CM

## 2012-06-02 NOTE — Progress Notes (Signed)
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 Pt wants to be compensated. Pt's partner is not a Event organiser patient. He was in the [employer] for 27 years and receives medical care at the [facility] in [place]. According to pt, ex-husband is covered by [employer] and [employer].     This social caseworker explained to pt that Medicare doesn't pay for PCA (Personal Care Attendant), and advised her to (1) ask her ex-husband to contact his private health insurance to verify if PCA is covered, and/or ask the [employer] if they pay for this kind of benefit.    Social caseworker called three PCA agencies recommended by [employer] to request application forms for their programs. Social caseworker couldn't reach any representative, so left voice-messages asking a representative to return the call.    Social caseworker called the following agencies: 1. [facility] - [phone]                                                                                    2. [facility] [phone]                                                                                 3. [employer] [phone]

## 2012-06-07 DIAGNOSIS — Z7722 Contact with and (suspected) exposure to environmental tobacco smoke (acute) (chronic): Secondary | ICD-10-CM | POA: Insufficient documentation
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
## 2012-06-30 ENCOUNTER — Ambulatory Visit: Payer: Self-pay

## 2012-07-17 ENCOUNTER — Other Ambulatory Visit (HOSPITAL_BASED_OUTPATIENT_CLINIC_OR_DEPARTMENT_OTHER): Payer: Self-pay

## 2012-07-17 NOTE — Telephone Encounter (Signed)
Tc to pt re: missed Mammo appt. Pt phone is out of order. Left vm with emergency contact for pt to call us to r/s Mammo.. Will send letter to pt.

## 2012-11-24 ENCOUNTER — Other Ambulatory Visit (HOSPITAL_BASED_OUTPATIENT_CLINIC_OR_DEPARTMENT_OTHER): Payer: Self-pay

## 2012-11-24 NOTE — Telephone Encounter (Signed)
Tc to pt: # is inactive, will send letter to pt home. Will not send ifob kit, Not sure if pt is still at same address.

## 2012-12-03 ENCOUNTER — Other Ambulatory Visit (HOSPITAL_BASED_OUTPATIENT_CLINIC_OR_DEPARTMENT_OTHER): Payer: Self-pay

## 2012-12-03 NOTE — Telephone Encounter (Signed)
Tc to pt re: overdue hm. Pt's phone is out of service. Left message with emergency contact. Will send certified letter.

## 2013-01-27 ENCOUNTER — Ambulatory Visit (HOSPITAL_BASED_OUTPATIENT_CLINIC_OR_DEPARTMENT_OTHER): Payer: MEDICARE

## 2013-02-22 ENCOUNTER — Encounter (HOSPITAL_BASED_OUTPATIENT_CLINIC_OR_DEPARTMENT_OTHER): Payer: Self-pay

## 2013-02-22 ENCOUNTER — Ambulatory Visit (HOSPITAL_BASED_OUTPATIENT_CLINIC_OR_DEPARTMENT_OTHER): Payer: MEDICARE

## 2013-02-22 VITALS — BP 130/80 | HR 104 | Temp 97.8°F | Ht 64.6 in | Wt 145.0 lb

## 2013-02-22 DIAGNOSIS — E039 Hypothyroidism, unspecified: Secondary | ICD-10-CM

## 2013-02-22 DIAGNOSIS — R21 Rash and other nonspecific skin eruption: Secondary | ICD-10-CM

## 2013-02-22 DIAGNOSIS — Z0000 Encounter for general adult medical examination without abnormal findings: Secondary | ICD-10-CM

## 2013-02-22 LAB — HIGH DENSITY LIPOPROTEIN: HIGH DENSITY LIPOPROTEIN: 46 mg/dL (ref 40–60)

## 2013-02-22 LAB — LOW DENSITY LIPOPROTEIN DIRECT: LOW DENSITY LIPOPROTEIN DIRECT: 136 mg/dL (ref 0–189)

## 2013-02-22 LAB — CHOLESTEROL: Cholesterol: 221 mg/dL (ref 0–239)

## 2013-02-22 LAB — THYROID SCREEN TSH REFLEX FT4: THYROID SCREEN TSH REFLEX FT4: 5.3 u[IU]/mL — ABNORMAL HIGH (ref 0.358–3.740)

## 2013-02-22 LAB — FREE THYROXINE: FREE THYROXINE: 1.56 ng/dL — ABNORMAL HIGH (ref 0.76–1.46)

## 2013-02-22 MED ORDER — NYSTATIN-TRIAMCINOLONE 100000-0.1 UNIT/GM-% EX OINT
TOPICAL_OINTMENT | Freq: Two times a day (BID) | CUTANEOUS | Status: DC
Start: 2013-02-22 — End: 2013-08-16

## 2013-02-23 ENCOUNTER — Ambulatory Visit (HOSPITAL_BASED_OUTPATIENT_CLINIC_OR_DEPARTMENT_OTHER): Payer: Self-pay

## 2013-02-23 LAB — MA SCREENING MAMMO BILATERAL WITH CAD

## 2013-02-23 LAB — HEMOGLOBIN A1C
ESTIMATED AVERAGE GLUCOSE: 111 (ref 74–160)
HEMOGLOBIN A1C: 5.5 % (ref 4.0–5.6)
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
## 2013-03-03 DIAGNOSIS — M1990 Unspecified osteoarthritis, unspecified site: Secondary | ICD-10-CM | POA: Insufficient documentation

## 2013-03-03 DIAGNOSIS — H269 Unspecified cataract: Secondary | ICD-10-CM | POA: Insufficient documentation

## 2013-03-16 ENCOUNTER — Encounter (HOSPITAL_BASED_OUTPATIENT_CLINIC_OR_DEPARTMENT_OTHER): Payer: Self-pay

## 2013-03-16 DIAGNOSIS — E039 Hypothyroidism, unspecified: Secondary | ICD-10-CM

## 2013-03-16 MED ORDER — LEVOTHYROXINE SODIUM 175 MCG PO TABS
175.0000 ug | ORAL_TABLET | Freq: Every day | ORAL | Status: DC
Start: 2013-03-16 — End: 2013-10-08

## 2013-07-27 ENCOUNTER — Encounter (HOSPITAL_BASED_OUTPATIENT_CLINIC_OR_DEPARTMENT_OTHER): Payer: Self-pay

## 2013-07-27 ENCOUNTER — Ambulatory Visit (HOSPITAL_BASED_OUTPATIENT_CLINIC_OR_DEPARTMENT_OTHER): Payer: MEDICARE

## 2013-07-27 ENCOUNTER — Ambulatory Visit (HOSPITAL_BASED_OUTPATIENT_CLINIC_OR_DEPARTMENT_OTHER): Payer: MEDICARE | Admitting: Internal Medicine

## 2013-07-27 ENCOUNTER — Encounter (HOSPITAL_BASED_OUTPATIENT_CLINIC_OR_DEPARTMENT_OTHER): Payer: Self-pay | Admitting: Internal Medicine

## 2013-07-27 VITALS — BP 130/74 | HR 83 | Temp 97.1°F | Wt 150.0 lb

## 2013-07-27 DIAGNOSIS — R21 Rash and other nonspecific skin eruption: Secondary | ICD-10-CM

## 2013-07-27 DIAGNOSIS — M899 Disorder of bone, unspecified: Secondary | ICD-10-CM

## 2013-07-27 DIAGNOSIS — F430 Acute stress reaction: Secondary | ICD-10-CM

## 2013-07-27 DIAGNOSIS — E039 Hypothyroidism, unspecified: Secondary | ICD-10-CM

## 2013-07-27 DIAGNOSIS — L309 Dermatitis, unspecified: Secondary | ICD-10-CM

## 2013-07-27 DIAGNOSIS — M949 Disorder of cartilage, unspecified: Secondary | ICD-10-CM

## 2013-07-27 MED ORDER — ALENDRONATE SODIUM 70 MG PO TABS
70.0000 mg | ORAL_TABLET | ORAL | Status: AC
Start: 2013-07-27 — End: 2014-07-27

## 2013-07-27 MED ORDER — FLUCONAZOLE 150 MG PO TABS
ORAL_TABLET | ORAL | Status: DC
Start: 2013-07-27 — End: 2013-10-08

## 2013-07-27 MED ORDER — CALCIUM 600-200 MG-UNIT PO TABS
1.0000 | ORAL_TABLET | Freq: Two times a day (BID) | ORAL | Status: AC
Start: 2013-07-27 — End: 2014-07-27

## 2013-07-27 NOTE — Progress Notes (Signed)
DERMATOLOGY PROCEDURE NOTE    SUBJECTIVE:   Beth Hudson is a 67 year old female who presents for lesion evaluation under the breasts and in her inguinal area; this rash is brown, hyperpigmented and very sore, raw; going on for more than 1 year and not improving; getting worse; had initially only been present under breasts and is now also in the groin;  She has tried plain antifungal and mycolog under the breasts with no improvement  she also has very raw, red, irritated vaginal area; not improving with otc antifungals;  Given rx for diflucan today by pcp  but also referred here for possible bx  She has a history of hypothyroid and takes levothyroxine      Past Medical History    Pyelonephritis, unspecified     Comment: None for 28 yrs.    HYPOTHYROIDISM NOS 09/22/2005    ANEMIA NOS 09/22/2005    Epistaxis 09/17/2005    Comment: Patient required cauterization twice, had severe episode and dx. With anemia; currently f/u at [facility]    BONE & CARTILAGE DIS NOS 09/17/2005    Comment: Bone Density 08/06 Osteopenia, Rx. Fosamax.         Past Surgical History    LIG/TRNSXJ FALOPIAN TUBE CESAREAN DEL/ABDML SURG      APPENDECTOMY      COLONOSCOPY STOMA DX W/WO COLLJ SPEC SPX  09/22/00    PR ESOPHAGOSCOPY FLEXIBLE TRANSORAL DIAGNOSTIC  07/21/00    Comment EGD       Family History    Heart Mother     Comment: HX. CABG    Hypertension Mother     Renal Mother     Comment: died 43, died of renal failure    Stroke Sister     Comment: age 20, now 25    Stroke Father     Comment: died age 2    Cancer - Other Brother     Comment: In his back, ? 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              For 2 years a pack a week. 40 years ago.     Alcohol Use: No  Drug Use: No              Sexual Activity: Not Currently     Partners with: Female       Comment: 4 children all in [place]. 1 son, 3                 daughters.    Other Topics            Concern  Sleep Concern           No  Stress Concern          No  Weight Concern          No  Special Diet            No    Social History Narrative    Lives with husband with end stage Lung Dz. Married him 66 year old and now married since 1970 (2013). 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 And son age 14. She has 3 daughters ages 78,34,32. She worked at [employer]- retired.         05/2012    Complex social situation. 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 She has been doing this for past 10 years.  She is requesting help at least 1-2 / week so that she can have time to herself. Very teary eyed and down about this and has never asked for help in the past. Referral to social work.          Informed consent was obtained and documented including option of not performing surgery, technique of surgery, potential for scarring, risk of infection, and allergic reaction to anesthetic.    OBJECTIVE:   Patient appears well.    Skin: hyperpigmented light brown macular rash under both breasts and in inguinal folds  The labia and vaginal tissue are beefy red and inflammed     PATIENT/PROCEDURE VERIFICATION DOCUMENTATION    Correct patient: Yes  Correct procedure: Yes  Correct site, mark visible if applicable: Yes  Correct position: Yes  Special equipment/implant(s) present, if applicable: Yes    Time-out completed, documented by provider doing procedure or designated team member:  Duane Lope     07/27/2013     3:40 PM       ASSESSMENT:   Rash under breasts and inguinal  area; location typical for candida infection but lack of response to antifungals and the brown color is not typical  Punch biopsy will be done to clarify diagnosis    I briefly looked at her labia and vaginal tissue and it looks typical for candida; agree with diflucan rx and will see if this improves      PLAN:   After site under left breast and in left inguinal region  was prepped in sterile fashion, using Betadine for cleansing and 0.5cc  1% Lidocaine with epinephrine for anesthetic, with sterile technique, punch biopsy size 2 mm was performed in each location  drysol applied with good hemostasis  Wound was dressed with vaseline ointment.  Wound care instructions were provided and the patient was instructed to be alert for any signs of cutaneous infection.  Follow up: the specimen is labeled and sent to pathology for evaluation, the patient may return prn.    POST PAIN ASSESSMENT:  Post pain assessment done. Patient rates pain as a 0 on a 0-10 pain scale.
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
## 2013-08-02 LAB — SURGICAL PATH SPECIMEN

## 2013-08-03 NOTE — Progress Notes (Signed)
Quick Note:    Note sent to pcp regarding path and next steps  Discussed with Dr Minette Headland who advised gyn referral for the vulvar involvement  Would treat with protopic for the inguinal and vulvar involvement- would want to hold off on this until pt sees gyn  protopic or triamcinolone for the rash under breasts  ______

## 2013-08-10 ENCOUNTER — Telehealth (HOSPITAL_BASED_OUTPATIENT_CLINIC_OR_DEPARTMENT_OTHER): Payer: Self-pay | Admitting: Registered Nurse

## 2013-08-10 NOTE — Telephone Encounter (Signed)
Message copied by Irena Reichmann on Tue Aug 10, 2013 12:49 PM  ------       Message from: Mardelle Matte       Created: Tue Aug 10, 2013 12:43 PM       Regarding: Biopsy test results        Contact: [phone]                       Beth Hudson [id number], 67 year old, female, Telephone Information:       Home Phone      [phone]       Work Phone      Not on file.       Mobile          Not on file.                     Patient's Preferred Pharmacy:               [facility] - [address] - [place], [place] - [address]       Phone: [phone] Fax: [phone]              [facility]       Phone: [phone] Fax: [phone]                     CONFIRMED TODAY: Yes              CALL BACK NUMBER:        Best time to call back:        Cell phone:        Other phone:              Available times:              Patient's language of care: English              Patient does not need an interpreter.              Patient's PCP: Lorina Rabon              Person calling on behalf of patient: Patient (self)              Calls today for test result(s).Biopsy test done 07/27/13 was at [facility] Pt requesting test results . No letter has been sent         ------

## 2013-08-13 ENCOUNTER — Telehealth (HOSPITAL_BASED_OUTPATIENT_CLINIC_OR_DEPARTMENT_OTHER): Payer: Self-pay | Admitting: Clinic/Center

## 2013-08-13 NOTE — Progress Notes (Signed)
Will forward the patient's request for test result to Dr. Ubaldo Glassing

## 2013-08-13 NOTE — Telephone Encounter (Signed)
Message copied by Bruna Potter on Fri Aug 13, 2013  4:29 PM  ------       Message from: Madilyn Fireman       Created: Fri Aug 13, 2013 12:05 PM       Regarding: Results/ requst to speak with PCP only                       Beth Hudson [id number], 67 year old, female, Telephone Information:       Home Phone      [phone]       Work Phone      Not on file.       Mobile          Not on file.                     Patient's Preferred Pharmacy:               [facility] - [address] - [place], [place] - [address]       Phone: [phone] Fax: [phone]              [facility] ([facility]       Phone: [phone] Fax: [phone]                     CONFIRMED TODAY: No              CALL BACK NUMBER: [phone]       Best time to call back:        Cell phone:        Other phone:              Available times:              Patient's language of care: English              Patient does not need an interpreter.              Patient's PCP: Lorina Rabon              Person calling on behalf of patient: Patient (self)              Calls today for test result(s). Patient requested to speak with PCP, offered to have nurse call back but the patient declined. Last time the patient called no one had returned the the call. Patient needs to know her biopsy results that was done at [facility] on 07/27/13. Patient aware that PCP is not in the office today but will be in tomorrow Saturday 08/14/13.                       ------

## 2013-08-16 ENCOUNTER — Telehealth (HOSPITAL_BASED_OUTPATIENT_CLINIC_OR_DEPARTMENT_OTHER): Payer: Self-pay

## 2013-08-16 ENCOUNTER — Ambulatory Visit (HOSPITAL_BASED_OUTPATIENT_CLINIC_OR_DEPARTMENT_OTHER): Payer: MEDICARE

## 2013-08-16 DIAGNOSIS — L259 Unspecified contact dermatitis, unspecified cause: Secondary | ICD-10-CM

## 2013-08-16 DIAGNOSIS — R21 Rash and other nonspecific skin eruption: Secondary | ICD-10-CM

## 2013-08-16 DIAGNOSIS — E039 Hypothyroidism, unspecified: Secondary | ICD-10-CM

## 2013-08-16 LAB — TSH (THYROID STIMULATING HORMONE): TSH (THYROID STIM HORMONE): 0.877 u[IU]/mL (ref 0.358–3.740)

## 2013-08-16 MED ORDER — TACROLIMUS 0.1 % EX OINT
TOPICAL_OINTMENT | Freq: Two times a day (BID) | CUTANEOUS | Status: AC
Start: 2013-08-16 — End: 2014-02-13

## 2013-08-16 NOTE — Progress Notes (Signed)
Already d/w patient in clinic today. tx!

## 2013-08-16 NOTE — Progress Notes (Addendum)
I have tried to reach pt to go over biopsy results a few times with no success  Left another VM   If pt calls back, ok for pcp or RN to discuss the following:     Discussed with Dr Minette Headland (dermatology)  who advised gyn referral for the vulvar involvement (if still having irritation in this area, which is likely)  Would treat with protopic for the inguinal and vulvar involvement- would want to hold off on this until pt sees gyn  For the rash under her breasts we can start triamcinolone or protopic

## 2013-08-16 NOTE — Progress Notes (Signed)
Patient has [employer] for prescription coverage  Member ID # [id number]  PA Phone # [phone], Spoke with Delsa Sale     Protopic 0.1% Ointment has been APPROVED for 3 months  (PA# [id number]) Copay: $3.60      The patient and their preferred pharmacy are aware of the approval.

## 2013-08-16 NOTE — Progress Notes (Signed)
Protopic 0.1% ointment requires prior authorization through the patients insurance. Please fill out the following information below and send back to the [employer] .       Prior authorization request for Protopic 0.1% Ointment  Clinical Indication:   Previous medications tried:   Diagnostic testing information:       Patient has Medicare D - [employer] for prescription coverage.  I.D.#: [id number]  Phone #: [phone]

## 2013-08-16 NOTE — Progress Notes (Addendum)
Prior authorization request for protopic 0.1% oint    Clinical Indication: inguinal and vulvar involvement      Previous medications tried: triamcinolone, OTC antifungals, fluconazole oral    Diagnostic testing information:

## 2013-08-16 NOTE — Progress Notes (Signed)
Patient seen in clinic and results discussed.

## 2013-08-16 NOTE — Progress Notes (Signed)
Cc: rash    SUBJECTIVE:  67 yo female here s/p biopsy of rash underneath bilateral breasts and inguinal area.   Results reviewed along with notes from derm provider.   Advised topical steriods and gyne consult.    There were no vitals taken for this visit.  gen aaox 3in nad    A/p    (692.9) Contact dermatitis and other eczema, due to unspecified cause  (primary encounter diagnosis)  Comment: will treat as below  Plan: tacrolimus (PROTOPIC) 0.1 % ointment            (782.1) Rash of groin  Comment:   Plan: REFERRAL TO GYNECOLOGY (INT)            (244.9) Unspecified hypothyroidism  Comment:   Plan: TSH (THYROID STIMULATING HORMONE), COLLECTION         VENOUS BLOOD VENIPUNCTURE

## 2013-08-17 ENCOUNTER — Ambulatory Visit (HOSPITAL_BASED_OUTPATIENT_CLINIC_OR_DEPARTMENT_OTHER): Payer: MEDICARE

## 2013-08-17 ENCOUNTER — Encounter (HOSPITAL_BASED_OUTPATIENT_CLINIC_OR_DEPARTMENT_OTHER): Payer: Self-pay | Admitting: Internal Medicine

## 2013-08-17 DIAGNOSIS — L439 Lichen planus, unspecified: Secondary | ICD-10-CM | POA: Insufficient documentation

## 2013-08-17 NOTE — Progress Notes (Signed)
Quick Note:    Pt aware- seen by pcp who started protopic and referred gyn  ______

## 2013-08-30 ENCOUNTER — Ambulatory Visit (HOSPITAL_BASED_OUTPATIENT_CLINIC_OR_DEPARTMENT_OTHER): Payer: MEDICARE | Admitting: Obstetrics & Gynecology

## 2013-08-30 ENCOUNTER — Telehealth (HOSPITAL_BASED_OUTPATIENT_CLINIC_OR_DEPARTMENT_OTHER): Payer: Self-pay

## 2013-08-30 VITALS — BP 100/70 | Wt 148.0 lb

## 2013-08-30 DIAGNOSIS — L439 Lichen planus, unspecified: Secondary | ICD-10-CM

## 2013-08-30 DIAGNOSIS — N949 Unspecified condition associated with female genital organs and menstrual cycle: Secondary | ICD-10-CM

## 2013-08-30 MED ORDER — CLOBETASOL PROPIONATE 0.05 % EX OINT
TOPICAL_OINTMENT | Freq: Two times a day (BID) | CUTANEOUS | Status: DC
Start: 2013-08-30 — End: 2014-01-12

## 2013-08-30 NOTE — Progress Notes (Signed)
Here for rash on groin  Has been there for over one year  Has tried antifungals without improvement  Pt now using tacrolimus on groin and is less itchy  Biopsy showed lichen planus  Pt now with mostly itching in vulva and around anus  Labia minora pale in color with small excoriations  D/w pt poss for lichen sclerosis   Advised for 3mm punch biopsy and agrees  Right labia prepped with betadine  1/2 cc lido injected  3 mm punch biopsy performed  Hemostatic with monsels  Tol procedure well  rx clobetasol, will call with results  F/u 8 weeks  Sherral Hammers

## 2013-08-30 NOTE — Progress Notes (Signed)
Patient reports she seen GYN today and Dr. Lynnell Chad suggested Dermatology consult  Patient also wanted to report to PCP that bx was done and results will be sent to her    Morning appt preferred for Derm at [facility]

## 2013-09-02 ENCOUNTER — Telehealth (HOSPITAL_BASED_OUTPATIENT_CLINIC_OR_DEPARTMENT_OTHER): Payer: Self-pay | Admitting: Obstetrics & Gynecology

## 2013-09-02 LAB — SURGICAL PATH SPECIMEN

## 2013-09-02 NOTE — Progress Notes (Signed)
TC to pt Informed her bx showed lichen sclerosis so she should continue using the cream clobetasol. Pt states understanding.

## 2013-09-02 NOTE — Progress Notes (Signed)
Please inform pt that her vulvar biopsy returned lichen sclerosis, rec to continue clobetasol  Thank you  Cheyna Retana

## 2013-09-03 ENCOUNTER — Encounter (HOSPITAL_BASED_OUTPATIENT_CLINIC_OR_DEPARTMENT_OTHER): Payer: Self-pay

## 2013-10-08 ENCOUNTER — Other Ambulatory Visit: Payer: Self-pay

## 2013-10-08 DIAGNOSIS — E039 Hypothyroidism, unspecified: Secondary | ICD-10-CM

## 2013-10-08 DIAGNOSIS — Z0000 Encounter for general adult medical examination without abnormal findings: Secondary | ICD-10-CM

## 2013-10-08 MED ORDER — SACCHAROMYCES BOULARDII 250 MG PO CAPS
250.00 mg | ORAL_CAPSULE | Freq: Every day | ORAL | Status: AC
Start: 2013-10-08 — End: 2014-10-08

## 2013-10-08 MED ORDER — LEVOTHYROXINE SODIUM 175 MCG PO TABS
175.0000 ug | ORAL_TABLET | Freq: Every day | ORAL | Status: DC
Start: 2013-10-08 — End: 2014-08-17
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
## 2013-10-19 ENCOUNTER — Ambulatory Visit (HOSPITAL_BASED_OUTPATIENT_CLINIC_OR_DEPARTMENT_OTHER): Payer: MEDICARE | Admitting: Obstetrics & Gynecology

## 2013-10-27 ENCOUNTER — Ambulatory Visit (HOSPITAL_BASED_OUTPATIENT_CLINIC_OR_DEPARTMENT_OTHER): Payer: Medicare (Managed Care) | Admitting: Obstetrics & Gynecology

## 2013-10-27 VITALS — BP 120/80 | Wt 151.2 lb

## 2013-10-27 DIAGNOSIS — L940 Localized scleroderma [morphea]: Secondary | ICD-10-CM | POA: Diagnosis not present

## 2013-10-27 DIAGNOSIS — L900 Lichen sclerosus et atrophicus: Secondary | ICD-10-CM

## 2013-10-27 NOTE — Progress Notes (Signed)
Here for follow up on lichen sclerosis. At last visit biopsy performed and pt treated with clobetasol. Pt also had previously biopsy on groin c/w lichen planus and was taking tacrolimus. Has tried antifungals in past without improvement. Has had issue for about one year. Pt states improvement of her vulva with clobetasol, has been using bid. No itching  Labia minora pale in color, poss vitiligo  D/w pt, lichen sclerosis, plan taper and follow up 2 months  15/20 min counseling visit  Sherral Hammers

## 2013-12-28 ENCOUNTER — Ambulatory Visit (HOSPITAL_BASED_OUTPATIENT_CLINIC_OR_DEPARTMENT_OTHER): Payer: Medicare (Managed Care) | Admitting: Obstetrics & Gynecology

## 2013-12-28 VITALS — BP 130/70 | Wt 157.0 lb

## 2013-12-28 DIAGNOSIS — L940 Localized scleroderma [morphea]: Secondary | ICD-10-CM | POA: Diagnosis not present

## 2013-12-28 DIAGNOSIS — L900 Lichen sclerosus et atrophicus: Secondary | ICD-10-CM

## 2013-12-28 MED ORDER — ESTROGENS, CONJUGATED 0.625 MG/GM VA CREA
TOPICAL_CREAM | VAGINAL | Status: AC
Start: 2013-12-28 — End: 2014-12-28

## 2013-12-28 NOTE — Progress Notes (Signed)
Here for follow up on lichen sclerosis  Was using clobetasol for 6-8 weeks and was not able to taper. At first symptoms resolved but then itching began again. 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    Has stopped tacrolimus prescribed for her groin rash  Vulva exam appears c/w lichen sclerosis and atrophy  No evidence of yeast  Small area of thickened white skin near edge of labia minora  rebiopsy performed as steroids not currently working  3 mm punch performed, hemostatic with monsels  D/w pt adding premarin cream and keep appt with derm which was scheduled previously  Frances Maywood

## 2013-12-30 ENCOUNTER — Telehealth (HOSPITAL_BASED_OUTPATIENT_CLINIC_OR_DEPARTMENT_OTHER): Payer: Self-pay | Admitting: Obstetrics & Gynecology

## 2013-12-30 LAB — SURGICAL PATH SPECIMEN

## 2013-12-30 NOTE — Progress Notes (Signed)
Please inform pt that her biopsy returned the same, lichen sclerosis, she has appt to see derm as her steroid therapy not as helpful  Thank you  Shalamar Crays

## 2013-12-30 NOTE — Progress Notes (Signed)
TC to pt Informed her that biopsy returned the same diagnosis, lichen sclerosis. Keep appt with dermatologist. She states understanding.

## 2014-01-05 DIAGNOSIS — N899 Noninflammatory disorder of vagina, unspecified: Secondary | ICD-10-CM | POA: Diagnosis not present

## 2014-01-05 DIAGNOSIS — N9489 Other specified conditions associated with female genital organs and menstrual cycle: Secondary | ICD-10-CM | POA: Diagnosis not present

## 2014-01-05 DIAGNOSIS — N949 Unspecified condition associated with female genital organs and menstrual cycle: Secondary | ICD-10-CM | POA: Diagnosis not present

## 2014-01-05 DIAGNOSIS — R300 Dysuria: Secondary | ICD-10-CM | POA: Diagnosis not present

## 2014-01-12 ENCOUNTER — Encounter (HOSPITAL_BASED_OUTPATIENT_CLINIC_OR_DEPARTMENT_OTHER): Payer: Self-pay | Admitting: Dermatology

## 2014-01-12 ENCOUNTER — Ambulatory Visit (HOSPITAL_BASED_OUTPATIENT_CLINIC_OR_DEPARTMENT_OTHER): Payer: Medicare (Managed Care) | Admitting: Dermatology

## 2014-01-12 DIAGNOSIS — R21 Rash and other nonspecific skin eruption: Secondary | ICD-10-CM | POA: Diagnosis not present

## 2014-01-12 MED ORDER — CLOBETASOL PROPIONATE 0.05 % EX OINT
TOPICAL_OINTMENT | CUTANEOUS | Status: AC
Start: 2014-01-12 — End: 2015-01-12

## 2014-01-12 NOTE — Progress Notes (Signed)
Pt is safe at home

## 2014-01-12 NOTE — Progress Notes (Signed)
Patient referred for:    Initial visit for this 68 year old year-old female who presents for evaluation and management of:    LOC (1)  Location: vulva  Duration: x 4 months  Changing?: getting worse  Symptoms: itching  Treatments: protopic--burns. Tried clobetasol but does not think it has helped either-burned (she thinks, she does not recall though)    Chart review:     Patient presents to skin procedure clinic 07/2013 for a rash under her breasts and in her groin. She had been using mycolog and an antifungal in the area without improvement. At that time she was also noted to have vaginal erosions which were though to be c/w a candidal infection--given diflucan. A bx was done from her inguinal fold at that time and was c/w a focal interface dermatitis with perivascular lymphocytic infiltrate and dermal melanophages (per path, could be c/w LPP, PIH, or hypersensitivity).  She was started on protopic for this. She then saw ob/gyn who performed a punch bx on her vulva which revealed a dermal chronic inflammatory infiltrate with dermal hyalinization c/w LSA.  She was started on clobetasol. She used the clobetasol for 6-8 weeks with improvement but was not able to taper off of it. She saw gyn in FU 12/2013 and a repeat bx was performed which again showed LSA.    I reviewed the patient's medications, allergies, past medical history, family history and review of systems as recorded in the medical chart and updated it where necessary.  Medical history that was key to diagnosis and treatment includes:    Past Dermatologic history: none, no h/o skin cancer or other skin disease    Family history:  ( -) skin cancer  ( -) other skin disease    All:  nkda    Relevant Past medical history:  none    History of sunburns: None    Sunscreen: none    Bx results:    07/2013, groin:    A.SKIN PUNCH BIOPSY, UNDER BREAST:  PAPILLARY DERMAL MELANOPHAGES AND SUPERFICIAL PERIVASCULAR LYMPHOCYTIC   INFILTRATE (SEE  NOTE).    B.SKIN PUNCH BIOPSY, INGUINAL/GROIN AREA:  DERMAL MELANOPHAGES, FOCAL INTERFACE DERMATITIS, AND SUPERFICIAL   PERIVASCULAR LYMPHOCYTIC INFILTRATE (SEE NOTE).    Note: The histiologic differential diagnosis is lichen planus pigmentosus,   a pigmented hypersensitivity reaction, such as to drug, and   post-inflammatory hyperpigmentation. Clinical correlation is recommended.    Comment: After review of the entire case, Dr. Tula NakayamaLyn McDivitt Para Marchuncan [facility]   [facility] has rendered the above diagnosis, with which   I concur.    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    12/2013, vulva:    >>FINAL DIAGNOSIS<<    VULVAR BIOPSY:  - LICHEN SCLEROSUS      Examination:  The patient is well-appearing, in no acute distress, alert and oriented x 3.  Mood and affect are normal. A complete cutaneous examination of the scalp, face, neck, eyelids, mouth, conjunctiva, chest, abdomen, back, bilateral arms, bilateral legs, buttocks, digits and nails reveals the following significant findings:    - the entire bilateral mucosal labia majora and vulva is pink red with several 1-2cm erosions with several discrete focal 101mm-2mm white papules  - underneath the left breast is a 4cm brown patch. Several 3-555mm brown macules in inguinal folds and left axillae    My  impression is    1. LSA  - very inflamed today and given discrete white papules, it is concerning for an overlying yeast infection. I wasn't able to scrape off any of the white areas however, so I did a swab and sent it for a fungal culture  - the erosions could also just be part of the LSA too. Needs to restart clobetasol with plan to use it for 12-24 weeks.   - if fungal culture is positive, will give diflucan  - d/c premarin cream    2. LPP--clinically consistent and asymptomatic.  Monitor  - I would note however, that it seems odd to give this patient two diagnoses here. There have been several case reports of LSA mimicking LP and there is some histologic overlap.       I asked the patient to follow up in 4 weeks

## 2014-01-18 ENCOUNTER — Telehealth (HOSPITAL_BASED_OUTPATIENT_CLINIC_OR_DEPARTMENT_OTHER): Payer: Self-pay | Admitting: Dermatology

## 2014-01-18 NOTE — Progress Notes (Signed)
.  CAlled patient to discuss culture results--no fungus so far so will hold off on antifungals. She is still having a lot of pain. Recommended continuing the clobetasol for now  Though. When she returns for FU, if it still looks like a fungal process will repeat culture.

## 2014-01-19 ENCOUNTER — Ambulatory Visit (HOSPITAL_BASED_OUTPATIENT_CLINIC_OR_DEPARTMENT_OTHER): Payer: Medicare (Managed Care)

## 2014-01-19 ENCOUNTER — Encounter (HOSPITAL_BASED_OUTPATIENT_CLINIC_OR_DEPARTMENT_OTHER): Payer: Self-pay

## 2014-01-19 VITALS — BP 138/68 | HR 94 | Temp 97.5°F | Wt 147.8 lb

## 2014-01-19 DIAGNOSIS — L439 Lichen planus, unspecified: Secondary | ICD-10-CM | POA: Diagnosis not present

## 2014-01-19 DIAGNOSIS — IMO0002 Reserved for concepts with insufficient information to code with codable children: Secondary | ICD-10-CM | POA: Diagnosis not present

## 2014-01-19 MED ORDER — SULFAMETHOXAZOLE-TRIMETHOPRIM 800-160 MG PO TABS
1.0000 | ORAL_TABLET | Freq: Two times a day (BID) | ORAL | Status: DC
Start: 2014-01-19 — End: 2014-01-19

## 2014-01-19 MED ORDER — OXYCODONE-ACETAMINOPHEN 5-325 MG PO TABS
1.00 | ORAL_TABLET | Freq: Four times a day (QID) | ORAL | Status: AC | PRN
Start: 2014-01-19 — End: 2014-02-16

## 2014-01-19 MED ORDER — SULFAMETHOXAZOLE-TRIMETHOPRIM 800-160 MG PO TABS
1.0000 | ORAL_TABLET | Freq: Two times a day (BID) | ORAL | Status: DC
Start: 2014-01-19 — End: 2014-01-21

## 2014-01-19 MED ORDER — FLUCONAZOLE 150 MG PO TABS
150.0000 mg | ORAL_TABLET | Freq: Once | ORAL | Status: DC
Start: 2014-01-19 — End: 2014-01-19

## 2014-01-19 MED ORDER — FLUCONAZOLE 150 MG PO TABS
150.0000 mg | ORAL_TABLET | Freq: Once | ORAL | Status: AC
Start: 2014-01-19 — End: 2014-01-19

## 2014-01-21 ENCOUNTER — Telehealth (HOSPITAL_BASED_OUTPATIENT_CLINIC_OR_DEPARTMENT_OTHER): Payer: Self-pay

## 2014-01-21 DIAGNOSIS — A491 Streptococcal infection, unspecified site: Secondary | ICD-10-CM

## 2014-01-21 MED ORDER — CLINDAMYCIN PHOSPHATE 100 MG VA SUPP
100.00 mg | Freq: Every evening | VAGINAL | Status: AC
Start: 2014-01-21 — End: 2014-01-28

## 2014-01-21 MED ORDER — AMOXICILLIN 500 MG PO TABS
500.00 mg | ORAL_TABLET | Freq: Three times a day (TID) | ORAL | Status: AC
Start: 2014-01-21 — End: 2014-01-28

## 2014-01-21 NOTE — Progress Notes (Signed)
Spoke with the patient and discussed vaginal cx per Dr. Sabino Gasser note below  The patient agrees to STOP taking Bactrim  Start taking amoxicillin (AMOXIL) 500 MG tablet, 3 times daily with breakfast, lunch and dinner  Stop using any other vaginal cream  Start using clindamycin suppository every night. : it should not burn because it should be inserted vaginally  Prescriptions sent to [place]    The patient verbalizes her understanding and agrees with the plan      Component      Latest Ref Rng 01/19/2014          11:35 AM   GENITAL CULTURE       GROUP B STREPTOCOCCUS (A)   PROCEDURE PROMPT

## 2014-01-21 NOTE — Progress Notes (Addendum)
Esmie,  This patient is very uncomfortable due to a very serious vaginal / labia infection and underlying skin problem called lichen planus. She has had a lot of vaginal pain and pain with urination from the irritated and inflamed skin.   Could you please call patient and let her know I have to change her antibiotics.   She is currently taking Bactrim for cellulitis of vaginal area.   The results of the vaginal culture is positive for the bacteria Group B strep.   I would like to switch to amoxicillin. Please have her take it 3 times a day (breakfast, lunch, dinner).   She also needs to use the clindamycin suppository. Please have her stop using all other creams. The clindamycin suppository is to be used every night. It should not burn because it should be inserted vaginally.   Thanks!  DrSS

## 2014-01-21 NOTE — Progress Notes (Signed)
Cc: vaginal pain    SUBJECTIVE:  68 year old female here because in tears over vaginal discomfort.   She has had 3 weeks of excessive pain, swelling and discomfort vaginally, labia and with urination.   She went to ER and received pain medication.   Seen in past few weeks by both derm and gyne, who both felt that it was lichen plan given biopsy results.  Patient reports clobetasol burning the area.   She continues to cry throughout the history.   Denies any sexual activity since husband has ED.  Ros  Denies fevers, chills, nausea, vomitting, diarrhea, abdominal pain, chest pain, shortness of breath, rashes or lower extremity edema.    BP 138/68  Pulse 94  Temp(Src) 97.5 F (36.4 C) (Temporal)  Wt 147 lb 12.8 oz (67.042 kg)  BMI 24.9 kg/m2  SpO2 99%  Gen: AAO x 3 in NAD  Abdomen: soft, nontender, nondistended. Good bowel sounds  Vagina and vulva are swollen, erythematous, with visible areas of white patches, skin debrided;  no discharge is noted.  Cervix normal without lesions. Uterus anteverted and mobile, normal in size and shape without tenderness.  Vaginal walls erythematous and swollen , culture obtained.  Ext: No rashes. No edema. Peripheral pulses strong 2+. Warm and dry    A/p  (V47.5) Genital problem  (primary encounter diagnosis)  Visible signs of cellulitis; will start treatment course. Patient's culture came back at time of this note, pos for group b strep/ will d/c bactrim and start amox. Clindamycin suppository.     (096.4) Lichen planus  Comment:   Plan: GENITAL CULTURE

## 2014-01-22 LAB — GENITAL CULTURE

## 2014-02-02 ENCOUNTER — Ambulatory Visit (HOSPITAL_BASED_OUTPATIENT_CLINIC_OR_DEPARTMENT_OTHER): Payer: Medicare (Managed Care)

## 2014-02-02 ENCOUNTER — Encounter (HOSPITAL_BASED_OUTPATIENT_CLINIC_OR_DEPARTMENT_OTHER): Payer: Self-pay

## 2014-02-02 VITALS — BP 110/80 | HR 90 | Temp 97.6°F | Wt 146.8 lb

## 2014-02-02 DIAGNOSIS — N760 Acute vaginitis: Secondary | ICD-10-CM | POA: Diagnosis not present

## 2014-02-02 DIAGNOSIS — R197 Diarrhea, unspecified: Secondary | ICD-10-CM | POA: Diagnosis not present

## 2014-02-02 MED ORDER — METRONIDAZOLE 500 MG PO TABS
500.00 mg | ORAL_TABLET | Freq: Three times a day (TID) | ORAL | Status: AC
Start: 2014-02-02 — End: 2014-02-09

## 2014-02-02 MED ORDER — PROBIOTIC DAILY PO CAPS
1.0000 | ORAL_CAPSULE | Freq: Every day | ORAL | Status: DC
Start: 2014-02-02 — End: 2015-04-19

## 2014-02-03 ENCOUNTER — Ambulatory Visit (HOSPITAL_BASED_OUTPATIENT_CLINIC_OR_DEPARTMENT_OTHER): Payer: Medicare (Managed Care)

## 2014-02-03 DIAGNOSIS — Z7189 Other specified counseling: Secondary | ICD-10-CM

## 2014-02-03 NOTE — Progress Notes (Signed)
Pt has been referral to this social caseworker by provider Dr. Sabino Gasser.     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 Pt wants to be compensated for the services she provides to him.     This social caseworker met pt on 06/02/2012 for same issue.    Spouse is covered by [employer] and private insurance provided by [employer]. Spouse is not a Sports administrator patient, he receives medical care at the [facility] in [place].    Social caseworker explained that [place] has a Primary Care Attendant (PCA) program that is ran by [employer], but pt must have Medicaid, and the PCA person cannot be the spouse. Medicare doesn't pay for PCA.    Social caseworker presented the [place] that could help pt with home shores, as cleaning, laundry and cooking. Pt declined the referral because her spouse doesn't allow any stranger inside the house.     Social caseworker said to pt that unfortunately there isn't any program that refunds spouses for the care they provide to their partners or husbands or wifes.

## 2014-02-03 NOTE — Progress Notes (Signed)
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 Pt wants to be compensated.     Pt's partner is not a Sports administrator patient. He was in the [employer] for 27 years and receives medical care at the [facility] in [place]. According to pt, ex-husband is covered by [employer] and [employer].     This social caseworker explained to pt that Medicare doesn't pay for PCA ([place]), and advised her to (1) ask her ex-husband to contact his private health insurance to verify if PCA is covered, and/or ask the [employer] if they pay for this kind of benefit.     Social caseworker called three PCA agencies recommended by [employer] to request application forms for their programs. Social caseworker couldn't reach any representative, so left voice-messages asking a representative to return the call.     Social caseworker called the following agencies:    1. [place] - [phone]   2. [facility] [place] [phone]   3. [employer] - [phone]

## 2014-02-04 ENCOUNTER — Telehealth (HOSPITAL_BASED_OUTPATIENT_CLINIC_OR_DEPARTMENT_OTHER): Payer: Self-pay | Admitting: Dermatology

## 2014-02-04 NOTE — Progress Notes (Addendum)
.  Called patient and informed ehr that culture (-) for fungus. Had been started on amox by PCP with good effect. Had culture done which grew out group B strep. Will FU with me as scheduled.

## 2014-02-11 LAB — FUNGAL CULTURE

## 2014-02-15 ENCOUNTER — Encounter (HOSPITAL_BASED_OUTPATIENT_CLINIC_OR_DEPARTMENT_OTHER): Payer: Self-pay | Admitting: Dermatology

## 2014-02-15 ENCOUNTER — Ambulatory Visit (HOSPITAL_BASED_OUTPATIENT_CLINIC_OR_DEPARTMENT_OTHER): Payer: Medicare (Managed Care) | Admitting: Dermatology

## 2014-02-15 DIAGNOSIS — L940 Localized scleroderma [morphea]: Secondary | ICD-10-CM | POA: Diagnosis not present

## 2014-02-15 DIAGNOSIS — L900 Lichen sclerosus et atrophicus: Secondary | ICD-10-CM

## 2014-02-15 DIAGNOSIS — L439 Lichen planus, unspecified: Secondary | ICD-10-CM | POA: Diagnosis not present

## 2014-02-15 NOTE — Progress Notes (Signed)
Derm diagnoses:  - lichen planus pigmentosus--inframammary chest  - Lichen sclerosus, vulva, s/p clobetasol    S: 68 year old female with above history presents for follow up. At the last visit one month ago, her vulvar area was very eroded and has white papules c/f candidiasis. I cultured the area however and the fungal culture was negative.     Plan had been to continue to use clobetasol. She saw her PMD who did a bacterial culture which grew out GBS. She has been doing better after a course of amoxicillin. Only has mild occasional itch now. Her LPP lesions on there chest are asymptomatic.      O: Focused skin exam performed. The patient is well-appearing, in no acute distress, alert and oriented x 3.  Mood and affect are normal. A focused cutaneous examination of the face, neck, eyelids, mouth, conjunctiva, chest, abdomen, groin reveals the following significant findings:    - labia minora and mucosal labia majora with areas of hyperpigmentation and hypopigmentation but architectural preservation. Inferior aspect of the labia majora with ~2cm erosion with focal pin point white papules  - the inframammary chest with several 1-3cm hyperpigmented patches    A/P: 68 yo female     1. Lichen sclerosus  - still with small active area inferiorly--advised her to use clobetasol BID x 2 weeks on this area--stop if it worsens.  - still has pinpoint white papules--I'm wondering now if these might just represent Tyson's glands (independent sebaceous glands)     2. Lichen planus pigmentosus--asymptomatic, monitor for now    Return to clinic 1 month

## 2014-02-20 NOTE — Progress Notes (Signed)
Cc: diarrhea    SUBJECTIVE:  67 year old f here following one week treatment of cellulitis of labia and vaginal area.  She is feeling much better. No further pain or discharge.   Able to urinate without pain.   No swelling.    Developed diarrhea after abx course. Diarrhea is 5-6 loose "Smelly" bms per day. "Its like water coming out of me! And it smells terrible".     No fevers or chills.     BP 110/80  Pulse 90  Temp(Src) 97.6 F (36.4 C) (Temporal)  Wt 146 lb 12.8 oz (66.588 kg)  BMI 24.73 kg/m2  SpO2 99%  Gen: AAO x 3 in NAD  HEENT: NCAT, Perrl, Eomi, Ears: external canal normal. TMS within normal, no erythema or dullness. Nasal turbinates within normal. Neck supple, no posterior or cervical adenopathy. Thyroid: normal size, no nodularity, nontender.  Heart: regular rate and rhythm. No murmurs ascultated  Lungs: Clear to ascultation bilateral. No wheezes, rales or rhonchi.   Abdomen: soft, nontender, nondistended. Good bowel sounds  Ext: No rashes. No edema. Peripheral pulses strong 2+. Warm and dry  Pelvic: no erythema or white areas of labia minora or majora. No edema.     A/p  (616.10) Vaginal cuff cellulitis  (primary encounter diagnosis)  Comment:   Plan: resolved thankfully on antibiotics. Patient feeling much better and happy!    (787.91) Diarrhea  Comment:   Plan: metroNIDAZOLE (FLAGYL) 500 MG tablet, Probiotic        Product (PROBIOTIC DAILY) CAPS

## 2014-03-15 ENCOUNTER — Ambulatory Visit (HOSPITAL_BASED_OUTPATIENT_CLINIC_OR_DEPARTMENT_OTHER): Payer: Medicare (Managed Care) | Admitting: Dermatology

## 2014-03-15 ENCOUNTER — Encounter (HOSPITAL_BASED_OUTPATIENT_CLINIC_OR_DEPARTMENT_OTHER): Payer: Self-pay | Admitting: Dermatology

## 2014-03-15 DIAGNOSIS — L940 Localized scleroderma [morphea]: Secondary | ICD-10-CM | POA: Diagnosis not present

## 2014-03-15 DIAGNOSIS — L900 Lichen sclerosus et atrophicus: Secondary | ICD-10-CM

## 2014-03-15 NOTE — Progress Notes (Signed)
Derm diagnoses:   - lichen planus pigmentosus--inframammary chest   - Lichen sclerosus, vulva, s/p clobetasol     S: 68 year old female with above history presents for follow up. At the last visit one month ago, she still had a small focally active area on exam.--recommended clobetasol BID.    Doing very well today. Nothing siginficant improvement--uses clobetasol PRN only which ends up being only a few nights per week. No pain. Lesions on chest are unchanged as well.    O: Focused skin exam performed. The patient is well-appearing, in no acute distress, alert and oriented x 3. Mood and affect are normal. A focused cutaneous examination of the face, neck, eyelids, mouth, conjunctiva, chest, abdomen, groin reveals the following significant findings:   - labia minora and mucosal labia majora with areas of hyperpigmentation but architectural preservation. No erosions, no erythema.  - the inframammary chest with several 1-3cm hyperpigmented patches     A/P: 68 yo female   1. Lichen sclerosus --quiescent today  - clobestasol PRN , no longer than 2 weeks at at time    2. Lichen planus pigmentosus--asymptomatic, monitor for now     Return to clinic 3 months or sooner PRN

## 2014-04-11 ENCOUNTER — Encounter (HOSPITAL_BASED_OUTPATIENT_CLINIC_OR_DEPARTMENT_OTHER): Payer: Self-pay | Admitting: Family Medicine

## 2014-04-11 ENCOUNTER — Ambulatory Visit (HOSPITAL_BASED_OUTPATIENT_CLINIC_OR_DEPARTMENT_OTHER): Payer: Medicare (Managed Care) | Admitting: Family Medicine

## 2014-04-11 VITALS — BP 140/68 | HR 77 | Temp 97.1°F | Wt 147.0 lb

## 2014-04-11 DIAGNOSIS — Z7189 Other specified counseling: Secondary | ICD-10-CM | POA: Diagnosis not present

## 2014-04-11 DIAGNOSIS — E039 Hypothyroidism, unspecified: Secondary | ICD-10-CM

## 2014-04-11 DIAGNOSIS — Z862 Personal history of diseases of the blood and blood-forming organs and certain disorders involving the immune mechanism: Secondary | ICD-10-CM

## 2014-04-11 DIAGNOSIS — Z7689 Persons encountering health services in other specified circumstances: Secondary | ICD-10-CM

## 2014-04-11 DIAGNOSIS — M899 Disorder of bone, unspecified: Secondary | ICD-10-CM | POA: Diagnosis not present

## 2014-04-11 DIAGNOSIS — M8580 Other specified disorders of bone density and structure, unspecified site: Secondary | ICD-10-CM

## 2014-04-11 DIAGNOSIS — M949 Disorder of cartilage, unspecified: Secondary | ICD-10-CM

## 2014-04-11 LAB — COMPREHENSIVE METABOLIC PANEL
ALANINE AMINOTRANSFERASE: 28 U/L (ref 12–45)
ALBUMIN: 4 g/dL (ref 3.4–5.0)
ALKALINE PHOSPHATASE: 126 U/L — ABNORMAL HIGH (ref 45–117)
ANION GAP: 6 mmol/L (ref 5–15)
ASPARTATE AMINOTRANSFERASE: 16 U/L (ref 8–34)
BILIRUBIN TOTAL: 0.4 mg/dL (ref 0.2–1.0)
BUN (UREA NITROGEN): 9 mg/dL (ref 7–18)
CALCIUM: 8.8 mg/dL (ref 8.5–10.1)
CARBON DIOXIDE: 30 mmol/L (ref 21–32)
CHLORIDE: 104 mmol/L (ref 98–107)
CREATININE: 0.7 mg/dL (ref 0.4–1.2)
ESTIMATED GLOMERULAR FILT RATE: >60 mL/min (ref >60)
Glucose Random: 94 mg/dL (ref 74–160)
POTASSIUM: 4 mmol/L (ref 3.5–5.1)
SODIUM: 140 mmol/L (ref 136–145)
TOTAL PROTEIN: 7.7 g/dL (ref 6.4–8.2)

## 2014-04-11 LAB — CBC WITH PLATELET
HEMATOCRIT: 39.7 % (ref 34.1–44.9)
HEMOGLOBIN: 13.1 g/dL (ref 11.2–15.7)
MEAN CORP HGB CONC: 33 g/dL (ref 31.0–37.0)
MEAN CORPUSCULAR HGB: 27.9 pg (ref 26.0–34.0)
MEAN CORPUSCULAR VOL: 84.6 fL (ref 80.0–100.0)
MEAN PLATELET VOLUME: 9.9 fL (ref 8.7–12.5)
PLATELET COUNT: 341 10*3/uL (ref 150–400)
RBC DISTRIBUTION WIDTH STD DEV: 41.5 fL (ref 35.1–46.3)
RBC DISTRIBUTION WIDTH: 13.6 % (ref 11.5–14.3)
RED BLOOD CELL COUNT: 4.69 M/uL (ref 3.90–5.20)
WHITE BLOOD CELL COUNT: 7.8 10*3/uL (ref 4.0–11.0)

## 2014-04-11 LAB — VITAMIN D,25 HYDROXY: VITAMIN D,25 HYDROXY: 18 ng/mL — CL (ref 30.0–100.0)

## 2014-04-11 LAB — THYROID SCREEN TSH REFLEX FT4: THYROID SCREEN TSH REFLEX FT4: 6.26 u[IU]/mL — ABNORMAL HIGH (ref 0.358–3.740)

## 2014-04-11 LAB — FREE THYROXINE: FREE THYROXINE: 1.17 ng/dL (ref 0.76–1.46)

## 2014-04-11 MED ORDER — CALCIUM-VITAMIN D 500-400 MG-UNIT PO TABS
1.0000 | ORAL_TABLET | Freq: Every day | ORAL | Status: DC
Start: 2014-04-11 — End: 2014-04-13

## 2014-04-11 NOTE — Progress Notes (Signed)
Quick Note:    Mildly elevated alk phos. Not urgent likely to osteopenia/malacia- checking vit d, just started on vit d today. Will recheck if still elevated after vit d normal and calcium normal will check ggt  ______

## 2014-04-11 NOTE — Patient Instructions (Signed)
Follow up in 3 months for CPEX

## 2014-04-11 NOTE — Progress Notes (Signed)
PRECEPTOR NOTE   I personally reviewed patient's history and exam findings with resident Dr.Chaponis .   I confirm the key elements of history and physical exam as described in resident's note.   I agree with the assessment and plan as described by resident.   Please see resident's note for further details.  68 y/o new patient.  H/o hypothyroidism- checking TSH.  H/o osteopenia- strarting on Vit D 800 IU.  Needs mammogram 02/2015.  IFOB- will get at f/u visit.      Neldon Mc, MD

## 2014-04-11 NOTE — Progress Notes (Signed)
Quick Note:    Patient with hypothyroidism, likely needs dose adjustment. Will await free T4 results before calling patient.  ______

## 2014-04-11 NOTE — Progress Notes (Cosign Needed)
[facility]    Subjective:   Beth Hudson is a 68 year old female who is here to establish care, tx care from [place] because she recently moved to [place]    #)Establish medical care (HCM)  - Was going to [facility] in [place]- have access to records   - IFOB October 2014 (colonoscopy 2001, normal)  - Mammogram 2014    #) Hypothyroidism  - Asymptomatic, no palpitations, cold intolerance, constipation, palpitations, fatigue, tremors  - Taking synthroid 175 mcg daily    #) Osteoperosis  - On fosamax  - Takes calcium - not taking vitamin D    #) HX of lichen sclerosis superinfected  - Resolved   - Went to [facility] after [facility]yn who prescribed abx  - no itching or pain   - Not taking estrogen credam or clobetasol any more      ROS:  No chest pain, SOB, abdominal pain, urinary symptoms, or neurological symptoms.    Patient Active Problem List:     Unspecified Hypothyroidism     Epistaxis     CARPAL TUNNEL SYNDROME     BONE & CARTILAGE DIS NOS     Family health problem     Second hand smoke exposure     Lichen planus      [facility]      MEDS:  Current outpatient prescriptions:Probiotic Product (PROBIOTIC DAILY) CAPS, Take 1 tablet by mouth daily., Disp: 30 capsule, Rfl: 6;  clobetasol (TEMOVATE) 0.05 % ointment, Apply to affected area in vagina BID, Disp: 60 g, Rfl: 2;  estrogen, conjugated, (PREMARIN) 0.625 MG/GM vaginal cream, Place 1 gram externally q night for 2 weeks then every other night, Disp: 45 g, Rfl: 1  levothyroxine (SYNTHROID, LEVOTHROID) 175 MCG tablet, Take 1 tablet by mouth daily., Disp: 30 tablet, Rfl: 5;  saccharomyces boulardii (FLORASTOR) 250 MG capsule, Take 1 capsule by mouth daily., Disp: 30 capsule, Rfl: 11;  alendronate (FOSAMAX) 70 MG tablet, Take 1 tablet by mouth once a week. ON AN EMPTY STOMACH., Disp: 4 tablet, Rfl: 12;  Calcium 600-200 MG-UNIT per tablet, Take 1 tablet by mouth 2 (two) times daily., Disp: 60 tablet, Rfl: 11    Review  of Patient's Allergies indicates:   Strawberry              Rash    Comment:Unable to eat fresh strawberries, ok with the rest    Objective:  BP 140/68  Pulse 77  Temp(Src) 97.1 F (36.2 C) (Tympanic)  Wt 147 lb (66.679 kg)  SpO2 98%  GEN:  Well appearing, NAD  PSYCH: Mood is good, affect is full  HEENT:  PERRLA, sclera non-injected, supple, no adenopathy and thyroid normal.  Oropharynx is normal, Nasal Mucousa pink moist, non-boggy.  HEART:  RRR, no murmurs, rubs or gallops.  PULM: Percussion normal. Good diaphragmatic excursion. Lungs clear to auscultation bilaterally  ABD: abdomen is soft without significant tenderness, masses, organomegaly or guarding  EXT: normal and no cyanosis, clubbing  NEURO: Gait normal. Reflexes normal and symmetric. Sensation grossly normal    Assessment and Plan:  [name] is a 68 year old female with hx hypothyroidism, osteopenia, and anemia who presents to establish care at [facility].    1. Establishing care with new doctor, encounter for  - Comprehensive Metabolic Panel  - [place] in 2014 normal, not due again until 2016  - Last pap 2012, negative cytology and for HPV, now 57 doesn't need  further testing  -  Last fecal occult blood in 2012, patient had colonoscopy 2001, refusing colonoscopies. Will do IFOB at next CPEX  - F/U in 2 months for CPEX    2. Osteopenia  Patient with osteopenia, but only on calcium and fosamax. As calcium not actively absorbed without vitamin D will start on 800U vit D in addition to 1000 mg CA. If vitamin D low will replete accordingly.   - Calcium Carb-Cholecalciferol (CALCIUM-VITAMIN D) 500-400 MG-UNIT TABS; Take 2 tablet by mouth daily.  Dispense: 30 tablet; Refill: 11  - 25-Vitamin D (Screening)    3. History of anemia  Patient has self reported anemia,  Last CBC 2008, will check today to ensure pt not anemic.   - CBC with Platelet     4. Unspecified hypothyroidism  Patient asymptomatic   - Thyroid Screen TSH Reflex FT4  - Free Thyroxine  - Pt  cont taking levothyroxine 175 mcg, will adjust as needed        I have reviewed the past medical, surgical, social and family history and updated these sections of EpicCare as relevant. All interim labs, test results, and consult notes were reviewed and discussed with Elmer Sow. Medications were reconciled during this visit and a current medication list was given to the patient at the end of the visit.    We discussed the patient's current medications. The patient expressed understanding and no barriers to adherence were identified .     For next time:   [  ] CPEX  [  ] IFOB  [  ] Zoster    Discussed with Dr. Michaela Corner

## 2014-04-13 MED ORDER — CALCIUM-VITAMIN D 500-400 MG-UNIT PO TABS
2.0000 | ORAL_TABLET | Freq: Every day | ORAL | Status: DC
Start: 2014-04-13 — End: 2015-04-19

## 2014-06-15 ENCOUNTER — Ambulatory Visit (HOSPITAL_BASED_OUTPATIENT_CLINIC_OR_DEPARTMENT_OTHER): Payer: Medicare (Managed Care) | Admitting: Dermatology

## 2014-06-15 DIAGNOSIS — L439 Lichen planus, unspecified: Secondary | ICD-10-CM | POA: Diagnosis not present

## 2014-06-15 DIAGNOSIS — L900 Lichen sclerosus et atrophicus: Secondary | ICD-10-CM

## 2014-06-15 DIAGNOSIS — L940 Localized scleroderma [morphea]: Secondary | ICD-10-CM | POA: Diagnosis not present

## 2014-06-15 NOTE — Progress Notes (Signed)
Derm diagnoses:   - lichen planus pigmentosus--inframammary chest   - Lichen sclerosus, vulva, s/p clobetasol     S: 68 year old female with above history presents for follow up. At the last visit 3 months ago, she was doing very well and the plan had been to use clobetasol PRN for no more than two weeks per month.    Doing well today. Has not had to use the clobetasol in over a month. LSA has been  Asymptomatic except for occasional itch.  The LPP lesions are not changing and not symptomatic though. Otherwise doing well with no skin complaints.     O: Focused skin exam performed. The patient is well-appearing, in no acute distress, alert and oriented x 3. Mood and affect are normal. A focused cutaneous examination of the face, neck, eyelids, mouth, conjunctiva, chest, abdomen, back, groin reveals the following significant findings:   - labia minora and mucosal labia majora are clear; architectural preservation noted. No erosions, no erythema.  - the inframammary chest with a 3cm hyperpigmented patch with some central hypopigmentation  - widespread scars on trunk and extremities (varicella scars)    A/P: 68 yo female     1. Lichen sclerosus --quiescent today  - clobestasol PRN , no longer than 2 weeks at at time    2. Lichen planus pigmentosus--asymptomatic, monitor for now     Return to clinic PRN

## 2014-06-15 NOTE — Progress Notes (Signed)
Pt is safe at home

## 2014-07-12 DIAGNOSIS — Z0100 Encounter for examination of eyes and vision without abnormal findings: Secondary | ICD-10-CM | POA: Diagnosis not present

## 2014-07-29 ENCOUNTER — Other Ambulatory Visit (HOSPITAL_BASED_OUTPATIENT_CLINIC_OR_DEPARTMENT_OTHER): Payer: Self-pay | Admitting: Clinic/Center

## 2014-07-29 DIAGNOSIS — Z1211 Encounter for screening for malignant neoplasm of colon: Secondary | ICD-10-CM

## 2014-08-04 ENCOUNTER — Telehealth (HOSPITAL_BASED_OUTPATIENT_CLINIC_OR_DEPARTMENT_OTHER): Payer: Self-pay | Admitting: Clinic/Center

## 2014-08-04 NOTE — Progress Notes (Signed)
Called to let the patient aware she is due for colonoscopy  The patient agrees with a referral  Referral pended   PE appointment made for 08/17/14 at 11:50 AM

## 2014-08-09 ENCOUNTER — Other Ambulatory Visit (HOSPITAL_BASED_OUTPATIENT_CLINIC_OR_DEPARTMENT_OTHER): Payer: Self-pay

## 2014-08-09 DIAGNOSIS — Z1211 Encounter for screening for malignant neoplasm of colon: Secondary | ICD-10-CM

## 2014-08-09 MED ORDER — PEG 3350-KCL-NA BICARB-NACL 420 G PO SOLR
ORAL | Status: DC
Start: 2014-08-09 — End: 2014-08-29

## 2014-08-09 NOTE — Telephone Encounter (Signed)
Spoke with patient regarding scheduling a colonoscopy. Patient stated she has a colonoscopy previously many years ago at [facility] but I am unable to locate report. Denies any F.H. Colon cancer.Procedure/preparation/need for ride explained. Medical history reviewed with patient.Patient scheduled for a colonoscopy  With Dr Marcelene Butte 08/29/14 at [facility] to arrive at 12:15PM

## 2014-08-09 NOTE — Patient Instructions (Addendum)
Verbal instructions given over phone. Written instructions sent to patient in mail. Script sent to pharmacy.    Colonoscopy Standard       Division of Gastroenterology  [facility]  Address: [address] [place], [place] [address]    PLEASE READ CAREFULLY OR HAVE SOMEONE READ THIS TO YOU!      Your Colonoscopy test is scheduled at [facility]    Day___10/5/15___     Time to Arrive 12:15PM____                You are having a colonoscopy.  This test lets the doctor see the inside your large intestine (also called your colon) using a small camera on a flexible tube.  There are several reasons to have the test:   *Screening for colon cancers and polyps    *To check unexplained changes in bowel habits   *To evaluate abnormal X-ray findings   *To look for bleeding ulcers or other abnormalities of the colon lining                 HOW TO GET READY FOR THE TEST      Your prescription was sent to your pharmacy or given to you.    Please pick up your prescription within 1 week of receiving these instructions.     IF YOU ARE USING NULYTELY OR A DRUG LIKE IT, DO NOT ADD WATER TO THE PRESCRIPTION UNTIL 1 DAY BEFORE THE DAY OF YOUR EXAM.    Your prescription was sent to: [facility] [address]______________________________________    Call a friend or a family member to make sure you have   someone to take you home after your test.   You must have someone to go home with after the test or we will not be able to give you sedatives!      If you have any questions or worries, or if you are unsure about how to prepare for this test, please call _[phone]____________                                  DAY OF THE TEST    4 to 5 hours before your test, drink the remaining half of the bottle of laxative.    It is very important to finish the WHOLE GALLON.    The morning of the test, you can take your other medications at the usual time with a sip of water. These include blood pressure pills, seizure  medications, heart medications, thyroid medications, etc).     Don't take pills for diabetes. Bring these medicines with you so that you can take them right after your test.      NOTHING to drink for 3 hours before the test.      IMPORTANT INFORMATION About Your Medicines  Based On Recommendations From Experts    IF YOU HAVE ANY CONCERNS ABOUT YOUR MEDICATIONS [place]. DO NOT WAIT UNTIL THE DAY BEFORE THE TEST!!!!!      Your Medications  You can take your medications for high blood pressure, heart problems, or anxiety with a sip of water at your usual time.  Bring a list of your medications with you.     If you take medicines for Type 2 Diabetes:    One day before the test: If you are taking diabetes pills: Take PILLS in the morning only. If you take morning insulin:  take your usual dose.    On the night before the test: Do not take diabetes pills.  If you take insulin for type 2 diabetes, take  your usual long acting insulin.  For example: if you usually take 40 units of Lantus or NPH, take 20 units instead.    On the morning of the test: Do not take diabetes pills. If you are on long-acting insulin for type 2 diabetes, you can take half the dose. For example if you are on 40 units of Lantus or NPH, take 20 units instead.     After the test: You will be allowed to eat normally again. At that time, you should resume taking your diabetes pills at your usual times. If on insulin, take your usual evening dose of insulin. If you can't eat for any reason, check with your doctor.    If you have Type 1 Diabetes:    One day before the test: Take your usual long-acting basal insulin (Lantus or NPH) and make sure your clear liquid diets contain sugar. Check your blood sugars at meal times and cover with short acting insulin only if blood sugar is over 200. Otherwise do not take your short-acting insulin.    On the night before the test: Just take your usual basal insulin dose that you would normally take at that  time (for example, your usual full dose of Lantus or NPH).    On the morning of the test: Take your usual basal insulin dose that you would normally take at that time (for example, your usual full dose of Lantus or NPH).                            Test Checklist    Please use this list to prepare for your test.     7 days before the test (____/____/____)     STOP taking Motrin, Advil, Naprosyn, Aleve or related drugs.   Continue to take all other prescribed meds.   Ask your doctor about whether you should continue Aspirin. It is sometimes        important to stay on this medication.    3 days before the test (____/____/____)     STOP eating any foods that contain beans, seeds, skins, nuts, or are high in fiber.    2 days before the test (____/____/____)     MAKE SURE YOU HAVE A RIDE ARRANGED    EAT dinner no later than 7 pm.  BEGIN a liquid diet. (Do not eat solids. This includes foods such as rice, pasta, bread and fruit).    1 day before the test (____/____/____)     PREPARE the laxative.   START taking the laxative at 6:00pm.   DRINK  the bottle of the laxative.    Day of your test (____/____/____)     FINISH  the rest of the laxative 5 to 6 hours before your test.     DO NOT drink anything 3 hours before the test.      WHAT TO EXPECT ON THE DAY OF YOUR TEST    Colonoscopy  Colonoscopy is a procedure used to see inside the colon and rectum.  During a colonoscopy a flexible tube with a camera and a light is inserted through the rectum. The doctor then examines the large bowel (also called the large intestine or colon).    Colonoscopies can detect inflamed tissue, ulcers and abnormal  growths called polyps. Some polyps are cancerous, but most are "pre cancerous". These might become dangerous someday. A doctor can usually remove these polyps during the test. They are then sent to a laboratory to be checked. Polyps are common in adults and are generally harmless. However, most colorectal cancers begin as polyps.  Removing them is a good way to prevent cancer. Your doctor may also take biopsies (small pieces of tissue) for analysis in the lab of abnormalities that they want to check in more detail.    It is extremely important to follow all of the steps for cleaning out your colon. If you do not follow all the steps, the doctor may not be able to see clearly. The exam might need to be cancelled and repeated another day. The clear liquids you can take during the clean out are treated as food by your body, so you won't starve or get dehydrated.        Getting Ready At [facility]  On the day of your test, a nurse and doctor will ask you some more information about your health history.  You will be put into a hospital gown. A small intravenous needle will be inserted in the back of your hand or forearm to give you medications that will make you comfortable during the test.    In the procedure room, you will be asked to lie down in a curled position on your left side.  Please inform the doctor if this is uncomfortable for you. You will be given oxygen to breathe. The test usually lasts 30 minutes to an hour. It may last longer if polyps need to be removed or if other abnormalities are noted.      You will be given medication through the IV in order to control discomfort and help you relax.  You may sleep or be partially awake during the test. Sometimes, you might feel a cramping sensation. We will monitor you and try to make you as comfortable as possible. The tube will be inserted into your rectum (back side) and advanced through the large bowel.  The doctor will try and look at all of the inner walls of your colon. The outer wall and organs outside the colon are not visible with this test.    Possible Complications  Complications are unusual during or after the test but they can happen. The most common risks include colonic perforation (a tear in the colon), bleeding, respiratory problems, blood pressure problems, heart  problems, discomfort and adverse reactions to the medications used.  A perforation may result in the need for emergency surgery and a colostomy bag. Also note, colonoscopy like other medical tests is not perfect. It may not detect problems such as polyps, cancers and other diseases up to 2 to 6% of the time. Luckily, the combined risk of all of these problems is small.    After the Test  You may feel bloated from air which was put into your colon during the test. You may also feel a little drowsy from the medications. You cannot drive or operate heavy machinery or do any important work for the rest of the day. You should plan on resting, watching TV or reading light material after the test. You may forget things that happen during and directly after the test. It is important have someone with you that can remind you of any instructions we give.    Depending on what is found, you may need to have the  colonoscopy repeated. Usually, this is done several years later but it may need to be done much sooner. Talk to your doctor about when you should have repeat study or other testing.    You will usually be at the hospital between 2-3 hours (although sometimes it can take longer).  We will make sure that you are alright before sending you home. You must arrange for someone to drive you home after the test.  Once again, we will not perform the test unless you have an arranged ride. You cannot go home in a taxi or a bus.    Colonoscopy is a safe and effective test that is commonly done at our facilities. You may receive a call to remind you of the date and time of your test. If you have any questions, please feel free to call.     For questions about the test itself, call [phone].    For questions about the date and time of your test, or to change the date or time, call [phone]    For questions regarding your regular medications or health issues, please call your doctor.

## 2014-08-17 ENCOUNTER — Encounter (HOSPITAL_BASED_OUTPATIENT_CLINIC_OR_DEPARTMENT_OTHER): Payer: Self-pay

## 2014-08-17 ENCOUNTER — Ambulatory Visit (HOSPITAL_BASED_OUTPATIENT_CLINIC_OR_DEPARTMENT_OTHER): Payer: Medicare (Managed Care)

## 2014-08-17 VITALS — BP 138/84 | HR 104 | Temp 97.6°F | Wt 138.4 lb

## 2014-08-17 DIAGNOSIS — E039 Hypothyroidism, unspecified: Secondary | ICD-10-CM

## 2014-08-17 DIAGNOSIS — Z23 Encounter for immunization: Secondary | ICD-10-CM | POA: Diagnosis not present

## 2014-08-17 LAB — FREE THYROXINE: FREE THYROXINE: 1.91 ng/dL — ABNORMAL HIGH (ref 0.76–1.46)

## 2014-08-17 LAB — THYROID SCREEN TSH REFLEX FT4: THYROID SCREEN TSH REFLEX FT4: 0.013 u[IU]/mL — ABNORMAL LOW (ref 0.358–3.740)

## 2014-08-17 MED ORDER — LEVOTHYROXINE SODIUM 175 MCG PO TABS
175.0000 ug | ORAL_TABLET | Freq: Every day | ORAL | Status: DC
Start: 2014-08-17 — End: 2015-04-19

## 2014-08-17 NOTE — Progress Notes (Signed)
Influenza and Pneumococcal Vaccine Procedure  August 17, 2014    1. Has the patient received the information for the influenza and pneumococcal vaccine? Yes    2. Does the patient have any of the following contraindications?  Allergy to eggs? No  Allergic reaction to previous influenza vaccines? No  Any other problems to previous influenza vaccines? No  Paralyzed by Guillain-Barre syndrome?  No  Current moderate or severe illness? No  Allergy to contact lens solution? No    3. The vaccines have been administered in the usual fashion and the patient/guardian was instructed to wait 20 minutes before leaving the building in the event of an allergic reaction:     Immunization information reviewed. Current VIS reviewed and given to patient/ guardian. Verbal assent obtained from patient/ guardian. Comfort measures for possible side effects reviewed.

## 2014-08-17 NOTE — Progress Notes (Signed)
SUBJECTIVE: 68 year old female for thyroid checkup. I have  fully reviewed the past medical, surgical, social and family history and updated the Histories section of EpicCare.    No symptoms of hypo or hyperthyroidism: no decreased or increased weight, no feeling cold/chilly or excessively warm, no diarrhea or constipation, no undue sweatiness, anxiety or palpitations.    BP 138/84 mmHg  Pulse 104  Temp(Src) 97.6 F (36.4 C) (Temporal)  Wt 62.778 kg (138 lb 6.4 oz)  SpO2 98%  Gen: AAO x 3 in NAD  HEENT: NCAT, Perrl, Eomi, Ears: external canal normal. TMS within normal, no erythema or dullness. Nasal turbinates within normal. Neck supple, no posterior or cervical adenopathy. Thyroid: normal size, no nodularity, nontender.  Heart: regular rate and rhythm. No murmurs ascultated  Lungs: Clear to ascultation bilateral. No wheezes, rales or rhonchi.   Abdomen: soft, nontender, nondistended. Good bowel sounds  Ext: No rashes. No edema. Peripheral pulses strong 2+. Warm and dry    A/p  (244.9) Unspecified hypothyroidism  (primary encounter diagnosis)  Comment:   Plan: levothyroxine (SYNTHROID, LEVOTHROID) 175 MCG         tablet            (V04.81) Need for prophylactic vaccination and inoculation against influenza  Comment:   Plan: IMMUNIZATION ADMIN SINGLE, RN, PR INFLUENZA         VACCINE QUADRIVALENT 3 YRS PLUS IM (PRIVATE)

## 2014-08-20 ENCOUNTER — Encounter (HOSPITAL_BASED_OUTPATIENT_CLINIC_OR_DEPARTMENT_OTHER): Payer: Self-pay

## 2014-08-26 DIAGNOSIS — H40013 Open angle with borderline findings, low risk, bilateral: Secondary | ICD-10-CM | POA: Diagnosis not present

## 2014-08-29 ENCOUNTER — Other Ambulatory Visit (HOSPITAL_BASED_OUTPATIENT_CLINIC_OR_DEPARTMENT_OTHER): Payer: Self-pay

## 2014-08-29 DIAGNOSIS — Z1211 Encounter for screening for malignant neoplasm of colon: Secondary | ICD-10-CM

## 2014-08-29 NOTE — Progress Notes (Signed)
patient states she ate yesterday; she finished the colon prep solution Golytely at 130 this morning. She is having liquid brown stools still. I spoke with Dr. Marcelene Butte who requested the patient be rescheduled in order to have a better examination. The patient agreed to this plan. New instructions and a new appointment date were given to the patient.

## 2014-08-30 MED ORDER — PEG 3350-KCL-NA BICARB-NACL 420 G PO SOLR
ORAL | Status: AC
Start: 2014-08-30 — End: 2014-10-28

## 2014-09-30 ENCOUNTER — Ambulatory Visit (HOSPITAL_BASED_OUTPATIENT_CLINIC_OR_DEPARTMENT_OTHER)
Admit: 2014-09-30 | Disposition: A | Discharge status: Home / Self Care | Payer: Self-pay | Source: Ambulatory Visit | Attending: Gastroenterology | Admitting: Gastroenterology

## 2014-09-30 ENCOUNTER — Encounter (HOSPITAL_BASED_OUTPATIENT_CLINIC_OR_DEPARTMENT_OTHER): Payer: Self-pay | Admitting: Gastroenterology

## 2014-09-30 DIAGNOSIS — Z79899 Other long term (current) drug therapy: Secondary | ICD-10-CM | POA: Diagnosis not present

## 2014-09-30 DIAGNOSIS — K5520 Angiodysplasia of colon without hemorrhage: Secondary | ICD-10-CM | POA: Diagnosis not present

## 2014-09-30 DIAGNOSIS — E039 Hypothyroidism, unspecified: Secondary | ICD-10-CM | POA: Diagnosis not present

## 2014-09-30 DIAGNOSIS — Z1211 Encounter for screening for malignant neoplasm of colon: Secondary | ICD-10-CM | POA: Diagnosis not present

## 2014-09-30 DIAGNOSIS — D120 Benign neoplasm of cecum: Secondary | ICD-10-CM | POA: Diagnosis not present

## 2014-09-30 DIAGNOSIS — D126 Benign neoplasm of colon, unspecified: Secondary | ICD-10-CM | POA: Diagnosis not present

## 2014-09-30 MED ORDER — MIDAZOLAM HCL 5 MG/5ML IJ SOLN
INTRAMUSCULAR | Status: DC
Start: 2014-09-30 — End: 2014-09-30
  Filled 2014-09-30: qty 5

## 2014-09-30 MED ORDER — SODIUM CHLORIDE 0.9 % IV SOLN
INTRAVENOUS | Status: DC
Start: 2014-09-30 — End: 2014-09-30

## 2014-09-30 MED ORDER — MIDAZOLAM HCL 2 MG/2ML IJ SOLN
Freq: Once | INTRAMUSCULAR | Status: AC | PRN
Start: 2014-09-30 — End: 2014-09-30
  Administered 2014-09-30: 1 mg via INTRAVENOUS

## 2014-09-30 MED ORDER — FENTANYL CITRATE 0.05 MG/ML IJ SOLN
Freq: Once | INTRAMUSCULAR | Status: AC | PRN
Start: 2014-09-30 — End: 2014-09-30
  Administered 2014-09-30: 25 ug via INTRAVENOUS

## 2014-09-30 MED ORDER — FENTANYL CITRATE 0.05 MG/ML IJ SOLN
INTRAMUSCULAR | Status: DC
Start: 2014-09-30 — End: 2014-09-30
  Filled 2014-09-30: qty 4

## 2014-09-30 NOTE — PROVATION-GI (Signed)
[facility]  Patient Name: Beth Hudson  MRN: [id number]  Account Number: [id number]  Gender: Female  Age: 68  Date of Birth: [birth year]  Admit Type: Outpatient  Patient Location: [facility]  Note Status: Finalized  Referring MD:         Reino Kent, MD  Procedure Date:       09/30/2014 12:13:09 PM  Procedure:            Colonoscopy  Endoscopist:          Junius Argyle, MD  Indications for Procedure:       Screening for colorectal malignant neoplasm  Medications:          Midazolam 3 mg IV, Fentanyl 75 micrograms IV  Procedure:       Just prior to the procedure, an updated history and physical was done. I        obtained an informed consent from the patient reviewing the risk of the        procedure including (but not limited to) respiratory depression, perforation,        bleeding, discomfort, a possible need for surgery and unexpected reactions to        medications. The patient is aware that test has limitations and may not        detect significant lesions such as cancer or other potential diseases. The        patient was also informed that they might need a repeat colonoscopy earlier        than standard guidelines if there are changes in their symptoms or concerning        findings noted. A time out was performed with the entire procedure staff        present. The scope was passed under direct vision. Throughout the procedure,        the patient's blood pressure, pulse, and oxygen saturations were monitored        continuously. The Colonoscope was introduced through the anus and advanced to        the cecum, identified by appendiceal orifice and ileocecal valve. The scope        was then slowly withdrawn with confirmation of the noted findings. The        colonoscopy was performed without difficulty. The patient tolerated the        procedure well. The quality of the bowel preparation was good. The total        duration of the procedure was 15 minutes. Scope withdrawal time was 9 minutes.  Findings:        Two polyps were found in the cecum. The polyps were diminutive in size. These        polyps were removed with a cold biopsy forceps. Resection and retrieval were        complete.       A single small angiodysplastic lesion without bleeding was found in the cecum.  Post Procedure Diagnosis:       - Two diminutive polyps in the cecum. Resected and retrieved.       - A single colonic angiodysplastic lesion.  Complications:        No immediate complications.  Recommendation:       - Await pathology results.       - Return to GI clinic at appointment to be scheduled.       - If the pathology report reveals adenomatous tissue, then repeat the  colonoscopy for surveillance in 5 years.  Junius Argyle, MD  09/30/2014 1:01 PM  This report has been signed electronically.  Number of Addenda: 0  Note Initiated On: 09/30/2014 12:13 PM

## 2014-09-30 NOTE — H&P (Signed)
GI Pre-procedure History and Physical Short Form  Beth Hudson is an68 year old female.    Chief Complaint: She is being scheduled for Colonoscopy    The history is provided by the patient. No language interpreter was used.           Active Problems:  Patient Active Problem List:     Unspecified hypothyroidism     Epistaxis     CARPAL TUNNEL SYNDROME     Disorder of bone and cartilage, unspecified     Family health problem     Second hand smoke exposure     Lichen planus      [facility]      History (Medical, Surgical, Social, Family):    Past Medical History    Pyelonephritis, unspecified     Comment: None for 28 yrs.    HYPOTHYROIDISM NOS 09/22/2005    ANEMIA NOS 09/22/2005    Epistaxis 09/17/2005    Comment: Patient required cauterization twice, had severe episode and dx. With anemia; currently f/u at [facility] and [place] NOS 09/17/2005    Comment: Bone Density 08/06 Osteopenia, Rx. Fosamax.         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 For 2 years a pack a week. 40 years ago.     Alcohol Use: No    Drug Use: No    Sexual Activity: Not Currently    Partners: Male    Comment: 4 children all in [place]. 1 son, 3 daughters.     Other Topics Concern    Sleep Concern No    Stress Concern No    Weight Concern No    Special Diet No     Social History Narrative    Lives with husband with end stage Lung Dz. Married him 67 year old and now  married since 1970 (2013). 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 And son age 68. She has 3 daughters ages 61,34,32. She worked at [employer]- retired.         05/2012    Complex social situation. 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 She has been doing this for past 10 years.  She is requesting help at least 1-2 / week so that she can have time to herself. Very teary eyed and down about this and has never asked for help in the past. Referral to social work.         Social Hx:        Lives: Lives in [place] with ex- husband and son. Takes care of exhusband since 2003, after CABG.     Relationship: Not romantically  Kids: 3 girls and one son, all adults. 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    3.        3 stresses     1. Taking care of ex husband, very stressful    2.    3.            Is religion/spirituality important to you                           Family History    Heart Mother     Comment: HX. CABG    Hypertension Mother     Renal Mother     Comment: died 7, died of renal failure    Stroke Sister     Comment: age 53, now 85    Stroke Father     Comment: died age 51    Cancer - Other Brother     Comment: In his back, ? Bone, doing well       Allergies:   Review of Patient's Allergies indicates:   Strawberry              Rash    Comment:Unable to eat fresh strawberries, ok with the rest    Medications:     Current Outpatient Prescriptions:  polyethylene glycol-electrolytes (NULYTELY WITH FLAVOR PACKS) 420 G solution Take as directed prior to colonoscopy Disp: 1 Bottle Rfl: 0   levothyroxine (SYNTHROID, LEVOTHROID) 175 MCG tablet Take 1 tablet by mouth daily. Disp: 30 tablet Rfl: 11   Probiotic Product (PROBIOTIC DAILY) CAPS Take 1 tablet by  mouth daily. Disp: 30 capsule Rfl: 6   clobetasol (TEMOVATE) 0.05 % ointment Apply to affected area in vagina BID Disp: 60 g Rfl: 2   estrogen, conjugated, (PREMARIN) 0.625 MG/GM vaginal cream Place 1 gram externally q night for 2 weeks then every other night Disp: 45 g Rfl: 1   saccharomyces boulardii (FLORASTOR) 250 MG capsule Take 1 capsule by mouth daily. Disp: 30 capsule Rfl: 11     No current facility-administered medications for this encounter.    Vitals:  Ht 5' 4.5" (1.638 m)  Wt 62.596 kg (138 lb)  BMI 23.33 kg/m2    Review of Systems   Constitutional: Negative for fever, appetite change and fatigue.   HENT: Negative.    Respiratory: Negative.    Cardiovascular: Negative.    Gastrointestinal: Negative.    Neurological: Negative.        Physical Exam   Constitutional: She is oriented to person, place, and time. She appears well-developed and well-nourished. No distress.   HENT:   Head: Normocephalic and atraumatic.   Mouth/Throat: Oropharynx is clear and moist.   Eyes: Conjunctivae are normal.   Neck: Normal range of motion. Neck supple.   Cardiovascular: Normal rate and normal heart sounds.    No murmur heard.  Pulmonary/Chest: Effort normal and breath sounds normal.   Abdominal: Soft. Bowel sounds  are normal. She exhibits no mass (No organomegaly).   Musculoskeletal: She exhibits no edema.   Neurological: She is alert and oriented to person, place, and time.   Skin: Skin is warm and dry. She is not diaphoretic.   Nursing note and vitals reviewed.         Airway Evaluation:  Gag reflex intact: Yes  Ability to open mouth wide:   Full  Dentures:  Full  Loose teeth:  No  Neck range of motion  Full    Mallampati AirwayClassification: Class I     A soft palate, fauces, uvula, anterior and posterior tonsil pillars are seen.  Mallampati Airway Classification:     ASA Classification: ASA Class I (a normally healthy patient)    Assessment:  Proceed with procedure

## 2014-09-30 NOTE — Discharge Instructions (Signed)
GI CENTER DISCHARGE INSTRUCTIONS     When you return home, you may feel sleepy. Get plenty of rest for the remainder of the day.     If you received sedation for your procedure DO NOT DRIVE, OPERATE MACHINERY OR MAKE IMPORTANT DECISIONS for the reminder of the day.     It is normal after having a COLONOSCOPY to feel a little gassy and bloated, but if you develop SEVERE ABDOMINAL PAIN call your doctor immediately.     It is normal after having a COLONOSCOPY to see a small amount of blood after your first few bowel movements, but if you see a LARGE AMOUNT OF BRIGHT RED BLOOD, call your doctor immediately.  .      Call your doctor immediately if you develop:   -  Chest Pain   -  Notice your bowel movements are black or maroon colored   -  You feel weak and tired     Call your physician for any unusual symptoms.     If for any reason you are unable to reach your doctor go to the nearest         [place].     *  Recommendation:  - Await pathology results.  - Return to GI clinic at appointment to be scheduled.  - If the pathology report reveals adenomatous tissue, then repeat the   colonoscopy for surveillance in 5 years    }   Date MD Name: Telephone:   09/30/2014 Junius Argyle ***   Reviewed by preop nurse

## 2014-10-04 LAB — SURGICAL PATH SPECIMEN

## 2014-10-17 ENCOUNTER — Encounter (HOSPITAL_BASED_OUTPATIENT_CLINIC_OR_DEPARTMENT_OTHER): Payer: Self-pay | Admitting: Gastroenterology

## 2014-11-14 ENCOUNTER — Ambulatory Visit (HOSPITAL_BASED_OUTPATIENT_CLINIC_OR_DEPARTMENT_OTHER): Payer: Medicare (Managed Care) | Admitting: Gastroenterology

## 2014-11-14 VITALS — BP 155/86 | HR 74 | Temp 98.0°F | Resp 16 | Ht 67.0 in | Wt 141.0 lb

## 2014-11-14 DIAGNOSIS — K635 Polyp of colon: Secondary | ICD-10-CM | POA: Insufficient documentation

## 2014-11-14 DIAGNOSIS — K5520 Angiodysplasia of colon without hemorrhage: Secondary | ICD-10-CM

## 2014-11-14 NOTE — Progress Notes (Signed)
Date of Service: 11/14/2014    HISTORY OF PRESENT ILLNESS:  The patient was seen in followup after she had a colonoscopy for screening purposes.    The colonoscopy showed 2 diminutive polyps in the cecum and a cecal angiodysplasia.    The pathology of the polyps was adenomatous.    PHYSICAL EXAMINATION:  Blood pressure 155/86, pulse 74, temperature 98 degrees, oxygen saturation 100%, and weight 141 pounds.    ASSESSMENT AND PLAN:  I explained the findings to the patient, and I told her that she needs to have a repeat colonoscopy in 5 years' time.    I also informed her about the finding of angiodysplasia and informed her that in most instances it remains asymptomatic, but occasionally such a lesion may cause bleeding.  I advised her against taking excessive amounts of aspirin or NSAIDS.     CC:  Jonita Albee.    ___________________________  Reviewed and Electronically Signed By: Junius Argyle MD  Sig Date: 11/24/2014  Sig Time: 10:16:37  Dictated By: Junius Argyle MD  Dict Date: 11/14/2014 Dict Time: 11 00 AM    Dictation Date and Time:11/14/2014 11:00:35  Transcription Date and Time:11/14/2014 12:06:08  eScription Dictation id: [id number] Confirmation # :[id number]

## 2014-11-14 NOTE — Progress Notes (Signed)
This office note has been dictated. Account number [id number]

## 2015-04-19 ENCOUNTER — Ambulatory Visit (HOSPITAL_BASED_OUTPATIENT_CLINIC_OR_DEPARTMENT_OTHER): Payer: MEDICARE | Admitting: Physician Assistant

## 2015-04-19 ENCOUNTER — Encounter (HOSPITAL_BASED_OUTPATIENT_CLINIC_OR_DEPARTMENT_OTHER): Payer: Self-pay | Admitting: Physician Assistant

## 2015-04-19 VITALS — BP 158/86 | HR 67 | Temp 97.8°F | Wt 137.4 lb

## 2015-04-19 DIAGNOSIS — M949 Disorder of cartilage, unspecified: Secondary | ICD-10-CM

## 2015-04-19 DIAGNOSIS — Z1239 Encounter for other screening for malignant neoplasm of breast: Secondary | ICD-10-CM

## 2015-04-19 DIAGNOSIS — M8589 Other specified disorders of bone density and structure, multiple sites: Secondary | ICD-10-CM

## 2015-04-19 DIAGNOSIS — M899 Disorder of bone, unspecified: Secondary | ICD-10-CM

## 2015-04-19 DIAGNOSIS — R197 Diarrhea, unspecified: Secondary | ICD-10-CM

## 2015-04-19 DIAGNOSIS — E039 Hypothyroidism, unspecified: Secondary | ICD-10-CM

## 2015-04-19 DIAGNOSIS — M8580 Other specified disorders of bone density and structure, unspecified site: Secondary | ICD-10-CM

## 2015-04-19 LAB — TSH (THYROID STIMULATING HORMONE): TSH (THYROID STIM HORMONE): 0.011 u[IU]/mL — ABNORMAL LOW (ref 0.358–3.740)

## 2015-04-19 LAB — THYROXINE (T4): THYROXINE (T4): 10.6 ug/dL (ref 4.8–13.9)

## 2015-04-19 LAB — VITAMIN D,25 HYDROXY: VITAMIN D,25 HYDROXY: 25 ng/mL — ABNORMAL LOW (ref 30.0–100.0)

## 2015-04-19 LAB — TOTAL T3 (TRIIODOTHYRONINE): TOTAL T3 (TRIIODOTHYRONINE): 93 ng/dL (ref 60–181)

## 2015-04-19 MED ORDER — PROBIOTIC DAILY PO CAPS
1.0000 | ORAL_CAPSULE | Freq: Every day | ORAL | 6 refills | Status: AC
Start: 2015-04-19 — End: 2016-04-19

## 2015-04-19 MED ORDER — LEVOTHYROXINE SODIUM 175 MCG PO TABS
175.0000 ug | ORAL_TABLET | Freq: Every day | ORAL | 11 refills | Status: DC
Start: 2015-04-19 — End: 2015-04-27

## 2015-04-19 MED ORDER — CALCIUM-VITAMIN D 500-400 MG-UNIT PO TABS
2.0000 | ORAL_TABLET | Freq: Every day | ORAL | 11 refills | Status: AC
Start: 2015-04-19 — End: 2015-05-19

## 2015-04-19 NOTE — Progress Notes (Signed)
SUBJECTIVE:    69 year old female c/o For past 2 mos without insurance so has not been able to get medications.    Now on [facility] Medicare to cover her meds  She need refills of all meds    Alendronate - was on this for more than 3 years. Can stop.        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              For 2 years a pack a week. 40 years ago.     Alcohol use: No              Drug use: No              Sexual activity: Not Currently     Partners with: Female       Comment: 4 children all in [place]. 1 son, 3                 daughters.    Other Topics            Concern  Sleep Concern           No  Stress Concern          No  Weight Concern          No  Special Diet            No    Social History Narrative    Lives with husband with end stage Lung Dz. Married him 68 year old and  now married since 1970 (2013). 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 And son age 81. She has 3 daughters ages 27,34,32. She worked at [employer]- retired.         05/2012    Complex social situation. 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 She has been doing this for past 10 years.  She is requesting help at least 1-2 / week so that she can have time to herself. Very teary eyed and down about this and has never asked for help in the past. Referral to social work.         Social Hx:        Lives: Lives in [place] with ex- husband and son. Takes care of exhusband since 2003, after CABG.     Relationship: Not romantically     Kids: 3 girls and one son, all adults. 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    3.        3 stresses     1. Taking care of ex husband, very stressful    2.    3.            Is religion/spirituality important to you                             Review of Patient's Allergies indicates:   Strawberry              Rash    Comment:Unable to eat fresh strawberries, ok with the rest      Current Outpatient Prescriptions on File Prior to Visit:  [DISCONTINUED] levothyroxine (SYNTHROID, LEVOTHROID) 175 MCG tablet Take 1 tablet by mouth daily. Disp: 30 tablet Rfl: 11   [DISCONTINUED] Calcium Carb-Cholecalciferol (CALCIUM-VITAMIN D) 500-400 MG-UNIT TABS Take 2 tablets by mouth daily. Disp: 30 tablet Rfl: 11   [DISCONTINUED] Probiotic Product (PROBIOTIC DAILY) CAPS Take 1 tablet by mouth daily. Disp: 30 capsule Rfl: 6     No current facility-administered medications on file prior to visit.       OBJECTIVE:  BP 158/86  Pulse 67  Temp 97.8 F (36.6 C) (Temporal)  Wt 62.3 kg (137 lb 6.4 oz)  SpO2 100%  BMI 21.52 kg/m2  Pt became tearful when talking about taking care of her ex-husband.  He won't allow a home health aid or visiting nurse, he will throw them out. Only wants the pt to take care of him.  She feeds him, bathes him, changes him.  He has an ostomy bag that she takes care of.  ENT: no  teeth        A&P:    (E03.9) Acquired hypothyroidism  Comment:   Plan: levothyroxine (SYNTHROID, LEVOTHROID) 175 MCG         tablet, TSH (THYROID STIMULATING HORMONE),         THYROXINE (T4), TOTAL T3 (TRIIODOTHYRONINE)            (  M85.80) Osteopenia  Comment:   Plan: Calcium Carb-Cholecalciferol (CALCIUM-VITAMIN         D) 500-400 MG-UNIT TABS, VITAMIN D,25 HYDROXY  - stop the alendronate. She has bee on it for more than 3 years.            (R19.7) Diarrhea  Comment:   Plan: Probiotic Product (PROBIOTIC DAILY) CAPS            (Z12.39) Screening for malignant neoplasm of breast  Comment:   Plan: [place] MAMMOGRAPHY SCREENING BILATERAL W CAD            Health Maintenance reviewed - mammogram ordered, patient to schedule appointment with help of       1. We reviewed patient's medications.  Pt reports taking all medications as instructed and finds no barriers to adherence.  2. Allergies were reviewed and updated.  3. Past medical, past surgical, family and social hx reviewed and updated as needed.  4. AVS given to pt.  5. The patient/guardian expressed understanding and no barriers to adherence to plan were identified.  Side effects of medications were discussed.  6. Return if sx worsen or fail to improve as anticipated.

## 2015-04-20 ENCOUNTER — Ambulatory Visit (HOSPITAL_BASED_OUTPATIENT_CLINIC_OR_DEPARTMENT_OTHER): Payer: Self-pay | Admitting: Physician Assistant

## 2015-04-20 DIAGNOSIS — Z1231 Encounter for screening mammogram for malignant neoplasm of breast: Secondary | ICD-10-CM

## 2015-04-20 LAB — MA SCREENING MAMMO BILATERAL WITH CAD

## 2015-04-27 ENCOUNTER — Telehealth (HOSPITAL_BASED_OUTPATIENT_CLINIC_OR_DEPARTMENT_OTHER): Payer: Self-pay | Admitting: Family Medicine

## 2015-04-27 DIAGNOSIS — E039 Hypothyroidism, unspecified: Secondary | ICD-10-CM

## 2015-04-27 MED ORDER — LEVOTHYROXINE SODIUM 150 MCG PO TABS
150.0000 ug | ORAL_TABLET | Freq: Every day | ORAL | 11 refills | Status: DC
Start: 2015-04-27 — End: 2015-10-26

## 2015-04-28 NOTE — Progress Notes (Signed)
Called and left voicemail to call us back about test results and med change

## 2015-04-28 NOTE — Progress Notes (Signed)
Patient Calls       You  Beth Hudson         No comment or note     Less than a minute ago (11:04 AM)                Beth Hudson  [name] patient  (Routing comment)     1 hour ago (9:50 AM)                [name]                          pls call pt to get new thyroid dose - old dose too high   TY   Kirsten (Routing comment)               Documentation   12 hours ago (10:36 PM)

## 2015-10-17 ENCOUNTER — Telehealth (HOSPITAL_BASED_OUTPATIENT_CLINIC_OR_DEPARTMENT_OTHER): Payer: Self-pay | Admitting: Family Medicine

## 2015-10-17 NOTE — Progress Notes (Unsigned)
Alverda Skeans from [facility] called the Central Refill Department to complete a benefit analysis for TDAP.    The vaccine is covered under the patient's Medicare B medical coverage.      Please choose [phone] which is listed in EPIC as PCV13 (private).    If you do not have the vaccine in stock, please contact the [facility] Drug Distribution department([phone]) to have the vaccine delivered to your clinic       Alverda Skeans from [facility] called the Central Refill Department to complete a benefit analysis for TDAP.    Medicare B does not cover TDAP. Please bill public     Please choose [phone] (Public) and notify Central Refill via cc'd chart once the vaccine has been administered.

## 2015-10-18 ENCOUNTER — Ambulatory Visit (HOSPITAL_BASED_OUTPATIENT_CLINIC_OR_DEPARTMENT_OTHER): Payer: MEDICARE | Admitting: Family Medicine

## 2015-10-18 ENCOUNTER — Encounter (HOSPITAL_BASED_OUTPATIENT_CLINIC_OR_DEPARTMENT_OTHER): Payer: Self-pay | Admitting: Family Medicine

## 2015-10-18 VITALS — BP 130/70 | HR 75 | Temp 97.8°F | Wt 141.6 lb

## 2015-10-18 DIAGNOSIS — Z23 Encounter for immunization: Secondary | ICD-10-CM

## 2015-10-18 DIAGNOSIS — Z789 Other specified health status: Secondary | ICD-10-CM

## 2015-10-18 DIAGNOSIS — Z0000 Encounter for general adult medical examination without abnormal findings: Secondary | ICD-10-CM

## 2015-10-18 DIAGNOSIS — I8393 Asymptomatic varicose veins of bilateral lower extremities: Secondary | ICD-10-CM

## 2015-10-18 DIAGNOSIS — R159 Full incontinence of feces: Secondary | ICD-10-CM

## 2015-10-18 DIAGNOSIS — E038 Other specified hypothyroidism: Secondary | ICD-10-CM

## 2015-10-18 DIAGNOSIS — Z9181 History of falling: Secondary | ICD-10-CM

## 2015-10-18 MED ORDER — CALCIUM CARBONATE-VITAMIN D 600-400 MG-UNIT PO TABS
1.0000 | ORAL_TABLET | Freq: Two times a day (BID) | ORAL | 12 refills | Status: DC
Start: 2015-10-18 — End: 2016-11-29

## 2015-10-18 NOTE — Progress Notes (Signed)
Female Annual Wellness Visit  Progress Note     Subjective:   Phalyn Was is a 69 year old female who comes for the Subsequent Annual wellness visit, including Personalized Prevention Plan (PPPE) (it has been at least 12 months since the annual wellness visit)..     History: (These sections have been updated in their respective activities)   Review of Patient's Allergies indicates:   Strawberry              Rash    Comment:Unable to eat fresh strawberries, ok with the rest    Past Medical History    ANEMIA NOS 09/22/2005    BONE & CARTILAGE DIS NOS 09/17/2005    Comment: Bone Density 08/06 Osteopenia, Rx. Fosamax.    Epistaxis 09/17/2005    Comment: Patient required cauterization twice, had severe episode and dx. With anemia; currently f[facility] at [facility]    HYPOTHYROIDISM NOS 09/22/2005    Pyelonephritis, unspecified     Comment: None for 28 yrs.       Past Surgical History    LIG/TRNSXJ FALOPIAN TUBE CESAREAN DEL/ABDML SURG      APPENDECTOMY      PR COLONOSCOPY STOMA DX INDUDING COLLJ SPEC [place]  09/22/00    PR ESOPHAGOSCOPY FLEXIBLE TRANSORAL DIAGNOSTIC  07/21/00    Comment EGD       Family History    Heart Mother     Comment: HX. CABG    Hypertension Mother     Renal Mother     Comment: died 8, died of renal failure    Stroke Sister     Comment: age 11, now 82    Stroke Father     Comment: died age 60    Cancer - Other Brother     Comment: In his back, ? 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 For 2 years a pack a week. 40 years ago.     Alcohol use No    Drug use: No    Sexual activity: Not Currently    Partners: Male    Comment: 4 children all in  [place]. 1 son, 3 daughters.     Other Topics Concern    Sleep Concern No    Stress Concern No    Weight Concern No    Special Diet No     Social History Narrative    Lives with husband with end stage Lung Dz. Married him 68 year old and now married since 1970 (2013). 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 And son age 77. She has 3 daughters ages 78,34,32. She worked at [employer]- retired.         05/2012    Complex social situation. 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 She has been doing this for past 10 years.  She is requesting help at least 1-2 / week so that she can have time to herself. Very teary eyed and down about  this and has never asked for help in the past. Referral to social work.         Social Hx:        Lives: Lives in [place] with ex- husband and son. Takes care of exhusband since 2003, after CABG.     Relationship: Not romantically     Kids: 3 girls and one son, all adults. All live in [place]    Pets:        Work: Takes care of ex husband        Diet:    Breakfast    Lunch    Diner    Snacks         Tobacco: None    ETOH: not much    Drugs: None        Sex with:        3 support people    1.Kids, close to son and daughters    2.    3.        3 stresses     1. Taking care of ex husband, very stressful    2.    3.            Is religion/spirituality important to you                           Current Outpatient Prescriptions:  levothyroxine (SYNTHROID, LEVOTHROID) 150 MCG tablet Take 1 tablet by mouth daily Disp: 30 tablet Rfl: 11   Probiotic Product (PROBIOTIC DAILY) CAPS Take 1 tablet by mouth daily Disp: 30 capsule Rfl: 6     No current facility-administered medications for this visit.     Providers and Suppliers (See demographics, encounters tab, filter by provider)  Apolonio Schneiders B. Donne Hazel, MD    [id number] - [place], [place] - [address]  Phone: [phone] Fax: [phone]    No outside specialists except eye doctor  near her house.    Screening:     Cognitive screen -  Cognitive impairment is not detected byobservation and patient report but further eval warranted at future visit.   Depression screen, See Questions 3-4 of Health Risk Assessment.  Last PHQ9 total score (Recorded from the Screening Questions section):  PHQ-9 TOTAL SCORE 07/27/2013   Questionnaire Total Score -   Doc FlowSheet Total Score 3   phq-2 today: zero.     Vision screen -    Pt reports eye appt within the last yr outside [facility]. Hearing loss screen -  Trouble hearing the television or radio when others can hear it? No  Straining or struggling to hear/understand conversations? No   Falls risk screen, See Questions 19-20 of Health Risk Assessment. Function screen, See Questions 8-13 of Health Risk Assessment.   Screen for overweight and hypertension:   BP 130/70  Pulse 75  Temp 97.8 F (36.6 C) (Temporal)  Wt 64.2 kg (141 lb 9.6 oz)  SpO2 99%  BMI 22.18 kg/m2  Body mass index is 22.18 kg/(m^2).   One-time screening for abdominal aortic aneurysm (AAA) by ultrasonography in men aged 51 to 76 who have ever smoked:  Is not indicated because it has already been done   Hepatitis B risk Assessment:    Because of lack of identifiable risk factors, her risk for hepatitis B is low (1) (moderate or high: consider Hep B vaccination)     Home safety screen:    Does  home have throw rugs, poor lighting or slippery bathtub or shower? No  Does home LACK grab bars in bathrooms or handrails on stairs and steps? No  Does home LACK functioning smoke alarms? No   Health Risk Assessment: Recorded in the Screening Questions Section, can be reviewed in Flowsheets activity tab under [place]'.     All above screening responses were reviewed, and all positive or abnormal responses were further evaluation or sent for referral as appropriate.    Counseling:     Advanced Care Planning:  The patient has already competed an advance directive     Health Maintenance: See Health  Maintenance Report for details of screening done.    Health Maintenance Items Overdue and Due Soon  HEP C SCREEN (DOB [birth year]) due on 05/05/1946  TDAP/TD VACCINE(1 - Tdap) due on 07/10/2006  ZOSTER VACCINE,AGE 19 AND OLDER (ONCE) due on 06/11/2007  PNEUMOCOCCAL VACCINE PPSV-23 due on 06/10/2012  PNEUMOCOCCAL VACCINE PCV-13 due on 06/10/2012  INFLUENZA VACCINE(1) due on 07/27/2015  PHYSICAL EXAM (AGE 55+) due on 08/18/2015  FALL RISK ASSESSMENT: OVER 65 due on 08/18/2015  HEALTH CARE PROXY due on 08/18/2015  AWQ Questionnaire due on 08/18/2015    All Health Maintenance Items and DueDates -  HEP C SCREEN (DOB [birth year]) due on 12[birth year]19/1945  TDAP/TD VACCINE(1 - Tdap) due on 07/10/2006  ZOSTER VACCINE,AGE 19 AND OLDER (ONCE) due on 06/11/2007  PNEUMOCOCCAL VACCINE PPSV-23 due on 06/10/2012  PNEUMOCOCCAL VACCINE PCV-13 due on 06/10/2012  INFLUENZA VACCINE(1) due on 07/27/2015  PHYSICAL EXAM (AGE 55+) due on 08/18/2015  FALL RISK ASSESSMENT: OVER 65 due on 08/18/2015  HEALTH CARE PROXY due on 08/18/2015  AWQ Questionnaire due on 08/18/2015  MAMMOGRAPHY due on 04/19/2017  LIPID SCREENING due on 02/22/2018  COLONOSCOPY due on 10/01/2019  BONE DENSITY due on 07/23/2020  HEP B HIGH RISK VACCINE EVAL (ONCE) Completed    Medicare GYN Exam:     GYN History:  post menopausal      Assessment & Plan:     Acute Issues:  1)Some varicose veins bothering her a bit. No swelling or pain.    2) A couple of times lost her balance- once in the bathtub- and fell about a month ago.  Another time 2013.  All of a sudden, had not felt dizzy previously. Occasionally off balance. Occasional fear of falling.  Tripped once in the street a month ago and fell/ cut her face. No cane use    3)Sometimes when she sneezes urine comes.   Couple times a week stool comes out without her realizing. No back pain.  No numbness. No diarrhea/urgency. No saddle anesthesia, no diarrhea/constipation.    On exam: BP 130/70  Pulse 75  Temp 97.8 F (36.6 C) (Temporal)   Wt 64.2 kg (141 lb 9.6 oz)  SpO2 99%  BMI 22.18 kg/m2   NAD  RRR no murmurs.   Lungs CTAB  Anus: normal tone of anal sphincter, normal rectal exam.   Extr: Scattered spider veins on legs. No edema or calf tenderness  Neuro: CNs intact, 5/5 LE strength, 2+ LE DTRs  Normal cerebellar testing including finger-nose, rapid alternating movements, Rombergs and tandem gait.  Normal get up and go    A/P: risk of falls- on cal/vit D. Normal get up and go, cerebellar testing. Offered PT to work on balance- declines. Has home safety measures in place.    Fecal incontinence-- no obvious reason identified- track sx- return if worsening.  Varicosities- reassured.    Identified Problems and Risks:  Patient Active Problem List:     Unspecified hypothyroidism     Epistaxis     Carpal tunnel syndrome     Disorder of bone and cartilage, unspecified     Family health problem     Second hand smoke exposure     Lichen planus      [facility]     Colon polyps, adenomatous     Angiodysplasia of cecum       Orders, Prescriptions, and Referrals placed in this Encounter:  No orders of the defined types were placed in this encounter.      Personalized Instructions:   See Patient Instruction section

## 2015-10-18 NOTE — Progress Notes (Signed)
10/18/2015  VIS given prior to administration and reviewed with the patient and or legal guardian. Patient understands the disease and the vaccine. See immunization/Injection module or chart review for date of publication and additional information.  Alverda Skeans, LPN

## 2015-10-22 LAB — HEPATITIS C ANTIBODY: HEPATITIS C ANTIBODY: NONREACTIVE

## 2015-10-22 LAB — FREE THYROXINE: FREE THYROXINE: 1.97 ng/dL — ABNORMAL HIGH (ref 0.76–1.46)

## 2015-10-22 LAB — THYROID SCREEN TSH REFLEX FT4: THYROID SCREEN TSH REFLEX FT4: 0.01 u[IU]/mL — ABNORMAL LOW (ref 0.358–3.740)

## 2015-10-26 ENCOUNTER — Telehealth (HOSPITAL_BASED_OUTPATIENT_CLINIC_OR_DEPARTMENT_OTHER): Payer: Self-pay | Admitting: Family Medicine

## 2015-10-26 DIAGNOSIS — E038 Other specified hypothyroidism: Secondary | ICD-10-CM

## 2015-10-26 MED ORDER — LEVOTHYROXINE SODIUM 125 MCG PO TABS
125.0000 ug | ORAL_TABLET | Freq: Every day | ORAL | 11 refills | Status: DC
Start: 2015-10-26 — End: 2016-11-29

## 2015-10-26 NOTE — Progress Notes (Signed)
Call to pt that TSH too low. Recommend lowering levothyroxine to 129mcg/day.new rx to pharmacy. Recheck lab in 6 wks. Future order placed.  Pt agrees.    Rejeana Brock Donne Hazel, MD

## 2016-07-05 ENCOUNTER — Ambulatory Visit (HOSPITAL_BASED_OUTPATIENT_CLINIC_OR_DEPARTMENT_OTHER): Payer: PRIVATE HEALTH INSURANCE | Admitting: Family Medicine

## 2016-07-05 ENCOUNTER — Encounter (HOSPITAL_BASED_OUTPATIENT_CLINIC_OR_DEPARTMENT_OTHER): Payer: Self-pay | Admitting: Family Medicine

## 2016-07-05 VITALS — BP 140/70 | HR 96 | Temp 97.9°F | Wt 139.2 lb

## 2016-07-05 DIAGNOSIS — N3941 Urge incontinence: Secondary | ICD-10-CM

## 2016-07-05 DIAGNOSIS — N952 Postmenopausal atrophic vaginitis: Secondary | ICD-10-CM

## 2016-07-05 DIAGNOSIS — N8110 Cystocele, unspecified: Secondary | ICD-10-CM

## 2016-07-05 LAB — POC URINALYSIS
BILIRUBIN, URINE: NEGATIVE
GLUCOSE,URINE: NEGATIVE
KETONE, URINE: NEGATIVE
NITRITE, URINE: NEGATIVE
PH URINE: 5.5 (ref 5.0–8.0)
PROTEIN, URINE: NEGATIVE
SPECIFIC GRAVITY, URINE: 1.01 (ref 1.003–1.030)
UROBILINOGEN URINE: 0.2 (ref 0.2–1.0)

## 2016-07-05 MED ORDER — ESTRADIOL 0.1 MG/GM VA CREA
2.00 g | TOPICAL_CREAM | Freq: Every day | VAGINAL | 5 refills | Status: AC
Start: 2016-07-05 — End: 2016-12-05

## 2016-07-05 NOTE — Progress Notes (Signed)
SUBJECTIVE:  Beth Hudson is a 70 year old female who presents for incontinence.   Can't make it to the bathroom. 1 month of this. Urgent need. 4-5x/day. No nocturia.  No dysuria.  Did have a hx of UTI. No hematuria.  No increased thirst    4 vaginal deliveries. No hx of incontinence.  Did have a surgery in 1977 to repair fallen bladder, tie her tubes.  No vaginal irrigation.  No sexual partner.  No constipation.  No excess caffeine.       PROBLEMS:   Patient Active Problem List:     Unspecified hypothyroidism     Epistaxis     Carpal tunnel syndrome     Disorder of bone and cartilage, unspecified     Family health problem     Second hand smoke exposure     Lichen planus      [facility]     Colon polyps, adenomatous     Angiodysplasia of cecum      PAST MEDICAL, SURGICAL, FAMILY, SOCIAL, IMMUNIZATION, HEALTH MAINT HISTORY: The following sections have been reviewed and updated in Epic:  past medical history, past surgical history, family history, social history    MEDICATIONS:   Current Outpatient Prescriptions on File Prior to Visit:  levothyroxine (SYNTHROID, LEVOTHROID) 125 MCG tablet Take 1 tablet by mouth daily before breakfast New dose instead of 125mcg Disp: 30 tablet Rfl: 11   Calcium Carbonate-Vitamin D (CALCIUM 600+D) 600-400 MG-UNIT TABS Tablet Take 1 tablet by mouth 2 (two) times daily Disp: 60 tablet Rfl: 12     No current facility-administered medications on file prior to visit.      ALLERGIES:  Review of Patient's Allergies indicates:   Strawberry              Rash    Comment:Unable to eat fresh strawberries, ok with the rest    OBJECTIVE:    VITALS:  BP 140/70  Pulse 96  Temp 97.9 F (36.6 C) (Temporal)  Wt 63.1 kg (139 lb 3.2 oz)  SpO2 97%  BMI 21.8 kg/m2  General: Well appearing, no apparent distress. Alert and oriented x 3.  GU: normal external genitalia, vagina appears atrophic. Some contact bleeding and tiny abrasion at entroitus/perineal border.  moderate cystocele. Normal cervix/uterus.   Psych: Normal affect, normal insight and judgment.     ASSESSMENT/PLAN:   PROBLEMS:   Beth Hudson is a 70 year old female here for  (N39.41) Urge incontinence of urine  (primary encounter diagnosis)  Comment: check Ucx. May relate to cystocele, atrophic vaginitis.   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    Discussed bladder training with pt.     Return prn    1. The patient indicates understanding of these issues and agrees with the plan.   No apparent learning barriers were identified. I attempted to answer any questions regarding the diagnosis and the proposed treatment.  2.  The patient is given an After Visit Summary sheet that lists all of their medications with directions, their allergies, orders  placed during this encounter, and follow- up instructions.  3. I reviewed the patient's medical information, allergies and medications. I reviewed the potential side effects of any new medications prescribed.     Electronically signed by: Rejeana Brock Donne Hazel, MD, 07/05/2016 11:29 AM  This note is electronically signed in the electronic medical record.

## 2016-07-05 NOTE — Patient Instructions (Signed)
These methods are to help you regain bladder control; think of it as "mind over bladder."   1. Start by going to the toilet and trying to pass urine every two hours while you are awake. Or, your health care provider may have you start with time interval based on a bladder diary result. You do not have to get up during the night!   2. You must try to pass urine whether you feel the need or not. You must try to pass urine even if you have just been incontinent.   3. If you get a strong urge to go to the bathroom before your scheduled time: Stop, don't run to the bathroom! Stand still or sit down if you can. RELAX. Take a deep breath and let it out slowly. Concentrate on making the urge decrease or even go away, anyway you can (imagine the pressure becoming less and less). When you feel IN CONTROL OF YOUR BLADDER, walk slowly to the bathroom, and then go.   4. Keep this schedule until you can go two days without urine leakage. Then, increase the time between scheduled trips to the toilet by one hour. When you can go two days without urine leakage, extend the time between trips again.   5. Keep this up until you can go four hours between trips to the toilet (which is NORMAL), or until you are comfortable. This may take several weeks.   6. DON'T GET DISCOURAGED! Bladder training takes time and effort, but it is an effective way to get rid of incontinence without medication or surgery

## 2016-07-07 LAB — URINE CULTURE

## 2016-07-08 ENCOUNTER — Telehealth (HOSPITAL_BASED_OUTPATIENT_CLINIC_OR_DEPARTMENT_OTHER): Payer: Self-pay

## 2016-07-08 DIAGNOSIS — N3090 Cystitis, unspecified without hematuria: Secondary | ICD-10-CM

## 2016-07-08 MED ORDER — NITROFURANTOIN MONOHYD MACRO 100 MG PO CAPS
100.00 mg | ORAL_CAPSULE | Freq: Two times a day (BID) | ORAL | 0 refills | Status: AC
Start: 2016-07-08 — End: 2016-07-15

## 2016-07-08 NOTE — Progress Notes (Signed)
Hi Esther,     Can you call Sagal and let her know that she has a urinary tract infection?  Please confirm that she hasn't developed fevers, chills, flank pain.  Assuming not, I have prescribed a 7-day course of macrobid for her to take, to [facility], twice daily. If she develops new sx (esp fevers, chills, flank pain) she should come back for evalution or if she doesn't see at least some improvement in her urinary urgency, she should come back for at least a repeat urine culture (please explain no need for provider visit I can put in a future order for that if needed, just call and ask for it).      Thanks!  Lyndee Leo

## 2016-07-09 ENCOUNTER — Telehealth (HOSPITAL_BASED_OUTPATIENT_CLINIC_OR_DEPARTMENT_OTHER): Payer: Self-pay | Admitting: Licensed Practical Nurse

## 2016-07-09 NOTE — Progress Notes (Signed)
Called to inform patient about UTI test result  and prescription sent to her pharmacy per Wenda Low.

## 2016-07-09 NOTE — Progress Notes (Signed)
Erroneous encounter

## 2016-11-29 ENCOUNTER — Other Ambulatory Visit (HOSPITAL_BASED_OUTPATIENT_CLINIC_OR_DEPARTMENT_OTHER): Payer: Self-pay | Admitting: Family Medicine

## 2016-11-29 DIAGNOSIS — E038 Other specified hypothyroidism: Secondary | ICD-10-CM

## 2016-11-29 DIAGNOSIS — Z0000 Encounter for general adult medical examination without abnormal findings: Secondary | ICD-10-CM

## 2016-11-29 NOTE — Progress Notes (Signed)
PER Pharmacy, Beth Hudson is a 71 year old female has requested a refill of Calcium-D 600 mg-400 unit and Levothyroxine 125 mcg.      Last Office Visit: 07/05/16 with Effie Berkshire  Last Physical Exam: 10/18/15      Other Med Adult:  Most Recent BP Reading(s)  07/05/16 : 140/70          Cholesterol (mg/dL)   Date Value   02/22/2013 221   ----------    LOW DENSITY LIPOPROTEIN DIRECT (mg/dL)   Date Value   02/22/2013 136   ----------    HIGH DENSITY LIPOPROTEIN (mg/dL)   Date Value   02/22/2013 46   ----------    TRIGLYCERIDES (mg/dl)   Date Value   07/09/2006 163 (H)   ----------        THYROID SCREEN TSH REFLEX FT4 (uIU/mL)   Date Value   10/18/2015 0.010 (L)   ----------        TSH (THYROID STIM HORMONE) (uIU/mL)   Date Value   04/19/2015 0.011 (L)   ----------      HEMOGLOBIN A1C (%)   Date Value   02/22/2013 5.5   ----------    No results found for: POCA1C      No results found for: INR      SODIUM (mmol/L)   Date Value   04/11/2014 140   ----------      POTASSIUM (mmol/L)   Date Value   04/11/2014 4.0   ----------          CREATININE (mg/dL)   Date Value   04/11/2014 0.7   ----------      Documented patient preferred pharmacies:    [facility] [place] [place] - [place]  Phone: [phone] Fax: [phone]

## 2017-01-03 ENCOUNTER — Other Ambulatory Visit (HOSPITAL_BASED_OUTPATIENT_CLINIC_OR_DEPARTMENT_OTHER): Payer: Self-pay | Admitting: Family Medicine

## 2017-01-03 DIAGNOSIS — E038 Other specified hypothyroidism: Secondary | ICD-10-CM

## 2017-01-03 NOTE — Progress Notes (Signed)
PER Pharmacy, Beth Hudson is a 71 year old female has requested a refill of levothyroxine .      Last Office Visit: 07/05/2016 with vogel, rachel  Last Physical Exam: 10/18/2015      Other Med Adult:  Most Recent BP Reading(s)  07/05/16 : 140/70          Cholesterol (mg/dL)   Date Value   02/22/2013 221   ----------    LOW DENSITY LIPOPROTEIN DIRECT (mg/dL)   Date Value   02/22/2013 136   ----------    HIGH DENSITY LIPOPROTEIN (mg/dL)   Date Value   02/22/2013 46   ----------    TRIGLYCERIDES (mg/dl)   Date Value   07/09/2006 163 (H)   ----------        THYROID SCREEN TSH REFLEX FT4 (uIU/mL)   Date Value   10/18/2015 0.010 (L)   ----------        TSH (THYROID STIM HORMONE) (uIU/mL)   Date Value   04/19/2015 0.011 (L)   ----------      HEMOGLOBIN A1C (%)   Date Value   02/22/2013 5.5   ----------    No results found for: POCA1C      No results found for: INR      SODIUM (mmol/L)   Date Value   04/11/2014 140   ----------      POTASSIUM (mmol/L)   Date Value   04/11/2014 4.0   ----------          CREATININE (mg/dL)   Date Value   04/11/2014 0.7   ----------      Documented patient preferred pharmacies:    [facility] [place], [place] - [place]  Phone: [phone] Fax: [phone]

## 2017-01-06 ENCOUNTER — Telehealth (HOSPITAL_BASED_OUTPATIENT_CLINIC_OR_DEPARTMENT_OTHER): Payer: Self-pay

## 2017-01-06 NOTE — Telephone Encounter (Signed)
-----   Message from Oneta Rack sent at 01/05/2017  4:44 PM EST -----  Please outreach for PE/ thyroid check. 1 mo of meds only. Thx! RV

## 2017-01-06 NOTE — Progress Notes (Signed)
         Pt still has fallon as insurance and we can't schedule if she has fallon. Checked with Nevin Bloodgood on that. Pt had medicare as a secondary but it says it expired which she was unaware of so she said she will call medicare to find out and call back to change and we can schedule then. It's physically not allowing any of [place] to schedule with her insurance saying Arna Medici so this is on hold.

## 2017-02-14 ENCOUNTER — Ambulatory Visit (HOSPITAL_BASED_OUTPATIENT_CLINIC_OR_DEPARTMENT_OTHER): Payer: MEDICARE | Admitting: Family Medicine

## 2017-02-14 ENCOUNTER — Encounter (HOSPITAL_BASED_OUTPATIENT_CLINIC_OR_DEPARTMENT_OTHER): Payer: Self-pay | Admitting: Family Medicine

## 2017-02-14 VITALS — BP 122/54 | HR 91 | Temp 97.0°F | Wt 150.0 lb

## 2017-02-14 DIAGNOSIS — H269 Unspecified cataract: Secondary | ICD-10-CM

## 2017-02-14 DIAGNOSIS — Z23 Encounter for immunization: Secondary | ICD-10-CM

## 2017-02-14 DIAGNOSIS — E038 Other specified hypothyroidism: Secondary | ICD-10-CM

## 2017-02-14 DIAGNOSIS — Z789 Other specified health status: Secondary | ICD-10-CM

## 2017-02-14 DIAGNOSIS — Z01818 Encounter for other preprocedural examination: Secondary | ICD-10-CM

## 2017-02-14 LAB — THYROID SCREEN TSH REFLEX FT4: THYROID SCREEN TSH REFLEX FT4: 0.538 u[IU]/mL (ref 0.358–3.740)

## 2017-02-14 MED ORDER — LEVOTHYROXINE SODIUM 125 MCG PO TABS: 125 ug | tablet | Freq: Every morning | ORAL | 0 refills | 0 days | Status: DC

## 2017-02-14 MED ORDER — LEVOTHYROXINE SODIUM 125 MCG PO TABS
125.0000 ug | ORAL_TABLET | Freq: Every morning | ORAL | 0 refills | Status: DC
Start: 2017-02-14 — End: 2017-03-24

## 2017-02-14 MED ORDER — CALCIUM-VITAMIN D 600-400 MG-UNIT PO TABS
1.0000 | ORAL_TABLET | Freq: Two times a day (BID) | ORAL | 11 refills | Status: AC
Start: 2017-02-14 — End: 2018-02-14

## 2017-02-14 MED ORDER — CALCIUM-VITAMIN D 600-400 MG-UNIT PO TABS: 1 | tablet | Freq: Two times a day (BID) | ORAL | 11 refills | 0 days | Status: AC

## 2017-02-14 NOTE — Progress Notes (Signed)
02/14/2017  VIS given prior to administration and reviewed with the patient and or legal guardian. Patient understands the disease and the vaccine. See immunization/Injection module or chart review for date of publication and additional information.  Lisette Abu, LPN

## 2017-02-14 NOTE — Progress Notes (Signed)
Out of med for 1 week    R cataract surgery [facility] 4/9 Dr Rose Fillers, a 71 year old female, presents for pre-op evaluation/risk stratification.  Proposed surgery: cataract removal  Type of anesthesia: presumed local, possibly moderate sedation  Indication: cataract  approx date of surgery: March 03, 2017  Location: [facility]  Surgeon: Dr Standley Brooking  History of excissive/abnromal bleeding: no  Level of physical activity (do ADLs w/o assist; walk indoors around the house; do light house work; climb a flight of stairs, run a short distance; participate in strenuous sports) : fully functional  Surgical history: appy, c-section    ROS:    chest pain: no  palps: no  sob/doe: no  orhopnea/pnd: no  Taking medicines as directed: no, ran out recently  Over the counter medicines: no    Smoking status: Former Smoker                                                              Packs/day: 0.00      Years: 0.00      Smokeless tobacco: Former Systems developer                     Comment: 2nd hand smoking from husband whos is 20 years           old and 2.5 packs a day in the house. For 2            years a pack a week. 40 years ago.   Alcohol use: No                 Drug use: No      Social History Narrative    Lives with husband with end stage Lung Dz. Married him 70 year old and now married since 1970 (2013). 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 And son age 70. She has 3 daughters ages 29,34,32. She worked at [employer]- retired.         05/2012    Complex social situation. 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 She has been doing this for past 10 years.  She is requesting help at least 1-2 / week so that she can have time to herself. Very teary eyed and down about this and has never asked for help in the past. Referral to social work.         Social Hx:        Lives: Lives in [place] with ex- husband and son. 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     Relationship: Not romantically     Kids: 3 girls and one son, all adults. All live in [place]    Pets:        Work: Takes care of ex husband        Diet:    Breakfast    Lunch    Diner    Snacks         Tobacco: None    ETOH: not much    Drugs: None        Sex with:        3 support people  1.Kids, close to son and daughters    2.    3.        3 stresses     1. Taking care of ex husband, very stressful    2.    3.            Is religion/spirituality important to you                             Family History    Heart Mother     Comment: HX. CABG    Hypertension Mother     Renal Mother     Comment: died 46, died of renal failure    Stroke Sister     Comment: age 60, now 57    Stroke Father     Comment: died age 56    Cancer - Other Brother     Comment: In his back, ? Bone, doing well         OBJECTIVE:  general: NAD  BP 122/54  Pulse 91  Temp 97 F (36.1 C) (Temporal)  Wt 68 kg (150 lb)  SpO2 97%  BMI 23.49 kg/m2   Heent: NCAT, MMM  Neck: supple  Cvs: RRR no MRG  Pulm: CTA b/l  Abdom: soft NT  Extrem: WWP  Neuro: grossly intact    CARDIAC RISK STRATIFICATION:  risk for perioperative cardiac event: low  recommendation: proceed with elective cataract surgery  1. Preoperative examination  EKG required per surgeon - normal  - EKG    2. Cataract of right eye, unspecified cataract type  As above    3. Other specified hypothyroidism  Ran out of med 1 week ago, will refill and check TSH today  - levothyroxine (SYNTHROID, LEVOTHROID) 125 MCG tablet; Take 1 tablet by mouth every morning before breakfast  Dispense: 30 tablet; Refill: 0  - THYROID SCREEN TSH REFLEX FT4  - COLLECTION VENOUS BLOOD VENIPUNCTURE    4. Need for prophylactic vaccination against Streptococcus pneumoniae (pneumococcus)    - IMMUNIZATION ADMIN SINGLE, RN  - PR PPSV23 VACCINE 2 YRS OR OLDER FOR SUBQ/IM USE

## 2017-02-17 ENCOUNTER — Encounter (HOSPITAL_BASED_OUTPATIENT_CLINIC_OR_DEPARTMENT_OTHER): Payer: Self-pay | Admitting: Family Medicine

## 2017-02-17 LAB — EKG

## 2017-03-24 ENCOUNTER — Other Ambulatory Visit (HOSPITAL_BASED_OUTPATIENT_CLINIC_OR_DEPARTMENT_OTHER): Payer: Self-pay | Admitting: Family Medicine

## 2017-03-24 DIAGNOSIS — E038 Other specified hypothyroidism: Secondary | ICD-10-CM

## 2017-03-24 NOTE — Progress Notes (Signed)
PER Pharmacy, Beth Hudson is a 71 year old female has requested a refill of LEVOYTHROXINE.        Last Office Visit: 02/14/2017 with Leandrew Koyanagi  Last Physical Exam: 10/18/2015        Thyroid Med:  TSH:  TSH (THYROID STIM HORMONE) (uIU/mL)   Date Value   04/19/2015 0.011 (L)   ---------- TSH Screen:  THYROID SCREEN TSH REFLEX FT4 (uIU/mL)   Date Value   02/14/2017 0.538   ----------       Documented patient preferred pharmacies:    [facility] [place], [place] - [place]  Phone: [phone] Fax: [phone]

## 2017-05-08 ENCOUNTER — Other Ambulatory Visit (HOSPITAL_BASED_OUTPATIENT_CLINIC_OR_DEPARTMENT_OTHER): Payer: Self-pay

## 2017-05-08 NOTE — Telephone Encounter (Signed)
Called patient to schedule Mammo and physical appointment. Patient not at home at the time of call, awaiting call back.

## 2017-06-10 ENCOUNTER — Other Ambulatory Visit (HOSPITAL_BASED_OUTPATIENT_CLINIC_OR_DEPARTMENT_OTHER): Payer: Self-pay

## 2017-06-10 NOTE — Telephone Encounter (Signed)
Called patient to schedule Annual Physical appointment. Appointment scheduled with PCP on 07/01/17.

## 2017-07-01 ENCOUNTER — Ambulatory Visit (HOSPITAL_BASED_OUTPATIENT_CLINIC_OR_DEPARTMENT_OTHER): Payer: MEDICARE | Admitting: Family Medicine

## 2017-08-06 ENCOUNTER — Encounter (HOSPITAL_BASED_OUTPATIENT_CLINIC_OR_DEPARTMENT_OTHER): Payer: Self-pay

## 2017-08-25 ENCOUNTER — Ambulatory Visit (HOSPITAL_BASED_OUTPATIENT_CLINIC_OR_DEPARTMENT_OTHER): Payer: Self-pay

## 2017-08-25 DIAGNOSIS — Z1231 Encounter for screening mammogram for malignant neoplasm of breast: Secondary | ICD-10-CM

## 2017-08-25 LAB — MA SCREENING MAMMO BILATERAL DIGITAL WITH DBT & CAD

## 2018-01-16 ENCOUNTER — Other Ambulatory Visit (HOSPITAL_BASED_OUTPATIENT_CLINIC_OR_DEPARTMENT_OTHER): Payer: Self-pay

## 2018-01-16 DIAGNOSIS — E038 Other specified hypothyroidism: Secondary | ICD-10-CM

## 2018-01-16 MED ORDER — LEVOTHYROXINE SODIUM 125 MCG PO TABS: 125 ug | tablet | Freq: Every morning | ORAL | 11 refills | 0 days | Status: AC

## 2018-01-16 MED ORDER — LEVOTHYROXINE SODIUM 125 MCG PO TABS
125.0000 ug | ORAL_TABLET | Freq: Every morning | ORAL | 11 refills | Status: DC
Start: 2018-01-16 — End: 2022-04-16

## 2018-01-16 NOTE — Progress Notes (Signed)
[facility]    Person calling on behalf of patient: Patient (self)    May list multiple medications in this section    Medicine Name: levothyroxine (SYNTHROID, LEVOTHROID) 125 MCG tablet  Documented patient preferred pharmacies:       [id number] [place], [place] [phone]

## 2018-01-16 NOTE — Progress Notes (Signed)
PER Patient (self), Beth Hudson is a 72 year old female has requested a refill of levothyroxine (SYNTHROID, LEVOTHROID) 125 MCG tablet         Please note:[facility] , [facility] from [facility] stating that they don't have the patient in their system, they will need a new script  Thank you         Last Office Visit: 02/14/2017 with Hale Bogus, MD  Last Physical Exam: 10/18/2015      Other Med Adult:  Most Recent BP Reading(s)  02/14/17 : 122/54        Cholesterol (mg/dL)   Date Value   02/22/2013 221     LOW DENSITY LIPOPROTEIN DIRECT (mg/dL)   Date Value   02/22/2013 136     HIGH DENSITY LIPOPROTEIN (mg/dL)   Date Value   02/22/2013 46     TRIGLYCERIDES (mg/dl)   Date Value   07/09/2006 163 (H)         THYROID SCREEN TSH REFLEX FT4 (uIU/mL)   Date Value   02/14/2017 0.538         TSH (THYROID STIM HORMONE) (uIU/mL)   Date Value   04/19/2015 0.011 (L)       HEMOGLOBIN A1C (%)   Date Value   02/22/2013 5.5       No results found for: POCA1C      No results found for: INR    SODIUM (mmol/L)   Date Value   04/11/2014 140       POTASSIUM (mmol/L)   Date Value   04/11/2014 4.0           CREATININE (mg/dL)   Date Value   04/11/2014 0.7       Documented patient preferred pharmacies:      [id number] [place], [place] - [address] AT [address]  Phone: [phone] Fax: [phone]

## 2018-02-09 ENCOUNTER — Ambulatory Visit (HOSPITAL_BASED_OUTPATIENT_CLINIC_OR_DEPARTMENT_OTHER): Payer: Medicare (Managed Care) | Admitting: Family Medicine

## 2018-02-16 ENCOUNTER — Ambulatory Visit (HOSPITAL_BASED_OUTPATIENT_CLINIC_OR_DEPARTMENT_OTHER): Payer: Medicare (Managed Care) | Admitting: Family Medicine

## 2018-03-13 ENCOUNTER — Encounter (HOSPITAL_BASED_OUTPATIENT_CLINIC_OR_DEPARTMENT_OTHER): Payer: Self-pay

## 2018-03-13 NOTE — Progress Notes (Signed)
Form received from [facility]  requesting to be signed and faxed back. Form placed in provider's folder.

## 2018-03-17 NOTE — Progress Notes (Signed)
[place] request.

## 2018-03-18 ENCOUNTER — Encounter (HOSPITAL_BASED_OUTPATIENT_CLINIC_OR_DEPARTMENT_OTHER): Payer: Self-pay

## 2018-03-18 NOTE — Progress Notes (Signed)
Form received from [facility] requesting to be signed and faxed back. Form placed in provider's folder.

## 2018-03-20 ENCOUNTER — Other Ambulatory Visit (HOSPITAL_BASED_OUTPATIENT_CLINIC_OR_DEPARTMENT_OTHER): Payer: Self-pay

## 2018-03-20 NOTE — Telephone Encounter (Signed)
Left message to contact us in return to schedule appt for PE and thyroid testing.

## 2018-03-23 ENCOUNTER — Encounter (HOSPITAL_BASED_OUTPATIENT_CLINIC_OR_DEPARTMENT_OTHER): Payer: Self-pay

## 2018-03-23 NOTE — Progress Notes (Signed)
Form received from [facility] requesting to be signed and faxed back. Form placed in provider's folder.

## 2018-03-25 NOTE — Progress Notes (Signed)
FAKE

## 2018-12-28 ENCOUNTER — Ambulatory Visit

## 2018-12-28 NOTE — Telephone Encounter (Signed)
 Phone Note -     Patient    Routine    Initial call taken by: Myra Gianotti ([facility]),  December 28, 2018 3:43 PM  Actual Caller: Patient  Call For: Nurse  Initial Details of Call:  New Patient appointment scheduled with Hakim  Patient's last PCP (name):Rachael Tyler Deis  Location of last PCP (phone number & address):[place]   Date of Last Physical Exam:2 years ago   Date of Last Medicare Wellness (if over 110):  Is there anyone you would authorize to verbally speak for you (make appts, get lab results, etc)?n/a  (if yes, list person(s) & notify pt signed release needed)  Pharmacy name & city (must also be added to [facility] [address] [place] [address]  For Office: Send retrieval authorization (does not need signature from patient) to the above former PCP.        Follow-up #1  Details: noted  By: Ival Bible ~ December 28, 2018 4:47 PM

## 2019-02-01 ENCOUNTER — Ambulatory Visit

## 2019-02-04 ENCOUNTER — Ambulatory Visit

## 2019-02-04 DIAGNOSIS — N393 Stress incontinence (female) (male): Secondary | ICD-10-CM | POA: Insufficient documentation

## 2019-02-04 DIAGNOSIS — E785 Hyperlipidemia, unspecified: Secondary | ICD-10-CM | POA: Insufficient documentation

## 2019-02-04 DIAGNOSIS — R739 Hyperglycemia, unspecified: Secondary | ICD-10-CM | POA: Insufficient documentation

## 2019-02-04 LAB — HX BASIC METABOLIC PANEL
HX ANION GAP: 4 (ref 3.0–11.0)
HX BICARBONATE: 28 mmol/L (ref 21.0–32.0)
HX BUN: 13 mg/dL (ref 8.0–23.0)
HX CALCIUM: 9.3 mg/dL (ref 8.5–10.5)
HX CHLORIDE: 109 mmol/L (ref 98.0–110.0)
HX CREATININE: 0.76 mg/dL (ref 0.55–1.3)
HX GLOMERULAR FR AFRICAN AMERICAN: >90
HX GLOMERULAR FR NON AFRICAN AMER: 79
HX GLUCOSE: 95 mg/dL (ref 70.0–110.0)
HX POTASSIUM: 4.1 mmol/L (ref 3.6–5.2)
HX SODIUM: 141 mmol/L (ref 136.0–146.0)

## 2019-02-04 LAB — HX GLYCOHEMOGLOBIN
HX ESTIMATED AVERAGE GLUCOSE: 117 mg/dL
HX HEMOGLOBIN A1C: 5.7 % — ABNORMAL HIGH (ref <=5.6)

## 2019-02-04 LAB — HX LIPID PANEL FASTING
HX CHOLESTEROL (LIPR): 193 mg/dL (ref <=200)
HX HDL CHOLESTEROL: 44 mg/dL (ref >=40)
HX LDL CHOLESTEROL: 104 mg/dL (ref <=130)
HX TRIGLYCERIDES: 227 mg/dL — ABNORMAL HIGH (ref <=150)

## 2019-02-04 LAB — HX SENSITIVE TSH W/O REFLEX: HX SENSITIVE TSH W/O REFLEX: 0.078 u[IU]/mL — ABNORMAL LOW (ref 0.358–3.74)

## 2019-02-04 NOTE — Progress Notes (Signed)
 History of Present Illness:  CC-----1) Stress incontinence 2)H/O hypothyroidism        Carolyn Young is a 73 year old Female who presents today for: evaluation of her hypothyroid condition .She denied any h/o UTI  or any h/o impacted bowel . She told me that she had difficult with big size baby delivery  43 years ago .  Is there an updated release of information to speak for the patient on file ? -- yes   Specialists seen since last visit?- Yes   Has previsit planning and a huddle been performed on this patient?-- yes     Did you self refer to any specialists?    Age: 73 Years Old  Last Secure Message:    Last PIN change:  03/03/2013         Labs/Vitals      Last eye exam on 08/26/2014.   Due No LDL on record !  HGBA1C =         Most Recent Visits     (Last rx/phone contact: 12/28/2018 )        08/26/2014   (Prob ck)    07/12/2014   (Prob ck)                                      Next Appointment(s) Today   [facility]  9:30 AM      Immunizations   No flu vaccine on file      No Adult Tetanus/Adacel documented !   Recent Flu:  None      Mammogram:  Date of Exam:  Date of Exam: .   PAP: Date of Exam: .   Bone Density:  Date of Exam:    Colonoscopy:  Date of Exam:  .  FIT Test:  Date:    Hemoccult:  Date:  .    Protocols Due  FLU VAX, PCV13#1 or PNEUMPED1 or PCV13#2, TDAP, MAMMOGRAM, PNEUMOVAX, BP SYSTOLIC, CHOLESTEROL, ZOSTAVAX, COLONOSCOPY.           Directives   The above represents the pre-visit preparations conducted for this patient.              Patient Instructions:  1)  Please schedule a follow-up appointment in 3 months. for hypothyroid condition .  2)  Take the prescribed medication for treating low thyroid and return for lab work and monitoring as scheduled.      Current Problems- Reviewed during today's visit  FEMALE STRESS INCONTINENCE (ICD10-N39.3)  HYPERGLYCEMIA (ICD10-R73.9)  HYPERLIPIDEMIA (ICD10-E78.5)  HYPOTHYROID (ICD10-E03.9)  CATARACTS  (ICD10-H26.9)  ARTHRITIS (ICD10-M19.90)    Current Medications- Reviewed during today's visit  LEVOTHYROXINE SODIUM 125 MCG ORAL TABLET: 1 by mouth daily    Current Allergies- Reviewed during today's visit  * STRAWBERRIES (Critical)    Past Medical History  cataracts  thyroid disease    Surgical History  cataract surgery  bladder reconstruction  tubal ligation  appendecomy    Family History  mom-cataracts passed-heart/kidney complications  dad-stroke  sister -stroke passed  brother-agent orange passed      Social History  Marital Status:  widowed       Children: 4    Lives With:son/first husband       Occupation:  at home/retired        Risk Factors  Tobacco User: no  Smoking Status: never smoked  Drug  use: no  Alcohol use: no  HIV high risk behavior: no    Exercise: Yes  Type: walk daily  Times per week: 5    Caffeine (drinks/day): coffee/ 1 cup daily  Sun exposure: rarely  Seatbelt use (%): 100  Bladder Control  issues: yes  Safety concerns: no  Falls Information:  Past year - no        Review of Systems   General: Complains of fatigue. Denies fever, chills, sweats, anorexia, weakness, malaise, weight loss.   Eyes: Complains of visual change or blurring. Denies eye pain. She wears eye glasses for reading  Ears/Nose/Throat: Complains of nasal congestion. Denies earache, decreased hearing, difficulty swallowing.   Cardiovascular: Complains of fatigue, leg cramps. Denies chest pain or pressure, palpitations, shortness of breath.   Respiratory: Complains of dry cough, snoring. Denies productive cough, shortness of breath, wheezing.   Gastrointestinal: Complains of constipation. Denies acid indigestion, nausea, vomiting, diarrhea, abdominal pain, change in bowel habits, mucous or blood in stools.   Urologic: Complains of stress or urge incontinence. Denies dysuria, hematuria, frequency.   Musculoskeletal: Complains of muscle cramps or aches, arthritis. Denies muscle weakness, morning stiffness, joint pain, joint  swelling.   Skin: Complains of dry skin. Denies rash, skin ulcers, suspicious lesions.   Neurologic: Denies dizziness, neck pain, headaches.   Psychiatric: Complains of anxiety, insomnia. Denies depression.   Endocrine: Denies polyuria, polydipsia, burning sensation in feet.   Heme/Lymphatic: Denies fevers, night sweats, weight loss.     PATIENT HEALTH QUESTIONNAIRE-9  (PHQ-9)    Over the last 2 weeks, how often have you been bothered by any of the following problems?   1. Little interest or pleasure in doing things:  1   - Several days  2. Feeling down, depressed, or hopeless:  1   - Several days  3. Trouble falling or staying asleep, or sleeping too much:  1   - Several days  4. Feeling tired or having little energy:  1   - Several days  5. Poor appetite or overeating:  0   - Not at all  6. Feeling bad about yourself - or that you are a failure or have let yourself or your family down:  0   - Not at all  7. Trouble concentrating on things, such as reading the newspaper or watching television:  0   - Not at all  8. Moving or speaking so slowly that other people could have noticed?  Or the opposite - being so fidgety or restless that you have been moving   around a lot more than usual:  0   - Not at all  9. Thoughts that you would be better off dead or of hurting yourself in some way:  0   - Not at all    Total Score: 4      Vital Signs     Patient: 73 Years Old Female  Height:  63.5 in.  Weight: 136 lbs        BMI:  23.80  BP:  135/73 right arm, large cuff, seated, physician repeat  Temp:  97.9  F    oral  Pulse:  79       regular  Resp:  16  Pulse Ox: 98 %  On Oxygen: No    Patient is not experiencing pain    Medications and Allergies Reviewed    Signed: Dawn M. Koleen Nimrod [place]....February 04, 2019 9:19 AM    PHQ 2  Over the last 2 weeks, how often have you been bothered by any of the following problems?  1. Little interest or pleasure in doing things:  1   - Several days  2. Feeling down, depressed, or hopeless:  1   -  Several days          Physical Exam    General:      well developed, well nourished, in no acute distress.  weight is 136Lbs BMI 23.80  Head:      normocephalic and atraumatic.    Eyes:      PERRL/EOM intact, conjunctiva and sclera clear with out nystagmus.    Ears:      TM's intact and clear with normal canals with grossly normal hearing.    Nose:      no deformity, discharge, inflammation, or lesions.    Mouth:      no deformity or lesions with good dentition.    Neck:      Thyroid - not palpable , no thyroid bruit, no adenopathy  Chest Wall:      no deformities or breast masses noted.    Breasts:      Not examined   Lungs:      clear bilaterally to auscultation.    Heart:      non-displaced PMI, chest non-tender; regular rate and rhythm, S1, S2 without murmurs, rubs, or gallops  Abdomen:       normal bowel sounds; no hepatosplenomegaly no ventral,umbilical hernias or masses noted.    Rectal:      not done  Genitalia:      Not examined   Msk:      no deformity or scoliosis noted of thoracic or lumbar spine.    Pulses:      pulses normal in all 4 extremities.    Extremities:      no clubbing, cyanosis, edema, or deformity noted with normal full range of motion of all joints.    Neurologic:      no focal deficits, cranial nerves II-XII grossly intact with normal sensation, reflexes, coordination, muscle strength and tone.    Skin:      intact without lesions or rashes.    Cervical Nodes:      no significant adenopathy.    Axillary Nodes:      no significant adenopathy.    Inguinal Nodes:      no significant adenopathy.    Psych:      alert and cooperative; normal mood and affect; normal attention span and concentration.  anxious.           Assessment and Plan:      ~HYPOTHYROID  -- clinically she looks like in euthyroid  condition - she has no palpable thyroid condition , no thyroid bruit, she was taking levothyroixin 150 micrograms a day   Plan to checj TSH - till then I will continue her current dose     ~FEMALE  STRESS INCONTINENCE  -- When she sneezes or is coughing she leaks urine - she wear under pads .I told her that there no any specefic medication for that she needs to see Uro-gyn doc who can teach her pelvic floor muscles strengthing and rehab          Changes to Medication List Documented Today:   LEVOTHYROXINE SODIUM 125 MCG ORAL TABLET (LEVOTHYROXINE SODIUM) 1 by mouth daily; Route: ORAL    Patient Instructions    Please schedule a follow-up appointment in 3  months. for hypothyroid condition .    Take the prescribed medication for treating low thyroid and return for lab work and monitoring as scheduled.    Medications:  LEVOTHYROXINE SODIUM 125 MCG ORAL TABLET (LEVOTHYROXINE SODIUM) 1 by mouth daily  #30 x 6   Route:ORAL   Entered and Authorized by: Tessla Spurling A. Taylee Gunnells Young   Signed by: Carolyn Young on 02/04/2019   Method used: Faxed to ...     [id number] (retail)     [address]     [place], [place]  [address]     Ph: [phone] or [phone]     Fax: [phone]   Note to Pharmacy: Route: ORAL;    RxID: [id number]

## 2019-02-08 ENCOUNTER — Ambulatory Visit

## 2019-02-08 NOTE — Progress Notes (Signed)
Orders Added    Medications:  Removed medication of LEVOTHYROXINE SODIUM 125 MCG ORAL TABLET (LEVOTHYROXINE SODIUM) 1 by mouth daily; Route: ORAL  Added new medication of LEVOTHYROXINE SODIUM 75 MCG ORAL TABLET (LEVOTHYROXINE SODIUM) 1 by mouth daily; Route: ORAL - Signed  Rx of LEVOTHYROXINE SODIUM 75 MCG ORAL TABLET (LEVOTHYROXINE SODIUM) 1 by mouth daily; Route: ORAL  #90 x 3;  Signed;  Entered by: Lashaun Krapf A. Desmond Tufano MD;  Authorized by: Anari Evitt A. Andrewjames Weirauch MD;  Method used: Print then Give to Patient; Note to Pharmacy: Route: ORAL;  Observations:  Added new observation of ENBSRVTYPENC: Orders (02/08/2019 21:09)

## 2019-02-09 ENCOUNTER — Ambulatory Visit

## 2019-02-09 NOTE — Progress Notes (Signed)
 [facility]   Onnie Graham, MD & Norval Morton, NP    Claris Gladden, MD        Janeece Riggers, MD    Tel: [phone]                 Tel: [phone]                          Tel: [phone]  [address], [address]  [place], [place] [address]  Fax: [phone]       February 09, 2019      Carolyn Young  [address] [place], [place] [address]        RE:  TEST RESULTS    Dear Ms. Zamarripa:    I have carefully reviewed your most recent test results, and below is a summary of my findings.  Please take note of any instructions provided.    Electrolyte Studies:  Normal.  Anion Gap:  4    Goal:  (5-15 mmol/L)  Bicarbonate:  28    Goal:  (23 - 31 mmol/L)  Calcium:  9.3    Goal:  (8.5 - 10.5 mg/dL)  Chloride:  027    Goal:  (98 - 110 mmol)  Potassium:  4.1    Goal:  (3.4 - 5.0 mmol/L)  Sodium:  141    Goal:  (135 - 145 mmol/L)  Blood Glucose:  95    Goal:  (70 - 100 mg/dL Fasting)    Kidney Function Studies:  Normal.  BUN:  13    Goal:  (8 - 23 mg/dL)  Creatinine:  2.53   Goal:  (0.550 - 1.300 mg/dL)  eGFR:  79    Goal:  ( > 90 mL/min/1.73 m2)  Note: If patient African-American, multiply result by 1.212.    Liver Function Studies:  Normal.    Cholesterol Studies:  Normal.  Cholesterol:  193    Goal:  ( < 200 mg/dL)  HDL:  44    Goal:  ( > 50 mg/dL)  LDL:  664    Goal:  ( < 130 mg/dL)  [id number] Goal:(< 150 mg/dL)    Blood Glucose Studies:  Normal.  Blood Glucose:  95    Goal:  (70 - 100 mg/dL Fasting)  IEPP2R:  5.7    Goal:  (4.2 - 5.8%)    Thyroid Studies:  Fair.  TSH:  0.078 MCLU/ML    Goal:  (0.27 - 4.20 uIU/mL)      Your Thyroid Blood test shows that you are taking to much Levothyroxine. I like to cut back 1/2 dose to 75mg  .we will repeat in 3 month.    Please call our office if you have any questions.    Sincerely,    Claris Gladden, MD

## 2019-02-15 ENCOUNTER — Ambulatory Visit

## 2019-03-16 ENCOUNTER — Ambulatory Visit

## 2019-03-16 NOTE — Progress Notes (Signed)
 Orders Added    Medications:  Added new medication of LOTRISONE 1-0.05 % EXTERNAL CREAM (CLOTRIMAZOLE-BETAMETHASONE) apply twice a day over the areas; Route: EXTERNAL - Signed  Rx of LOTRISONE 1-0.05 % EXTERNAL CREAM (CLOTRIMAZOLE-BETAMETHASONE) apply twice a day over the areas; Route: EXTERNAL  #45[Tube] x 1;  Signed;  Entered by: Ainsleigh Kakos A. Jarelis Ehlert MD;  Authorized by: Allizon Woznick A. Sanders Manninen MD;  Method used: Electronically to [id number] [address] [place], [place]  [address], Ph: [phone] or [phone], Fax: [phone]; Note to Pharmacy: Route: EXTERNAL;  Observations:  Added new observation of ENBSRVTYPENC: Orders (03/16/2019 9:42)

## 2019-03-16 NOTE — Telephone Encounter (Signed)
 Phone Note -     Patient    ***URGENT***    Call back at Cell [phone]  Initial call taken by: Aniceto Boss (Patient Access Center),  March 16, 2019 8:44 AM  Initial Details of Call:  Patient states that she has a rash in her vaginal area that spreads to her buttocks. Patient states that she tried creams, but they havent helped. Scheduled with NP Camara today at 11:15 am and transferred to office.      Follow-up #1  Details: Spoke to patient. She states last Wednesday she started noticing a rash on the outer vaginal area. Patient states she bought OTC Vagisil cream but it did not help at all. Patient states the rash has spread all the way up to her belly button and also in the other direction to her buttocks. There is not urinary symptoms. This is all external. I asked patient if she wears depends and she states only if she goes out but they never get very wet and she doesnt stay in them for long. Other then when she goes out she wears underwear and uses the toilet. She states the rash is very itchy and now it hurts because she scratched it a lot. Patient states she cannot sleep because of the itching and burning.   By: Luvenia Redden LPN ~ March 16, 2019 8:59 AM            Okay , I will speak to patient to know the details history .

## 2019-05-11 ENCOUNTER — Ambulatory Visit

## 2019-05-11 DIAGNOSIS — Z1211 Encounter for screening for malignant neoplasm of colon: Secondary | ICD-10-CM | POA: Insufficient documentation

## 2019-05-11 NOTE — Progress Notes (Signed)
 History of Present Illness:  This real-time, interactive virtual Telehealth encounter was done by:    phone     Two patient identifiers were used and confirmed. --yes  The patient was at home-- yes   My location: office-- office  Others participants:    None                    Their involvement:   Greater than 50% of the time was spent devoted to counseling/ coordinating care.-- yes  The patient has consented to this encounter type.-- yes   Total minutes spent:  25    CC--1) Feeling tired   2) IItchy rashes over the buttock  3) Anxiety    Carolyn Young is a 73 year old Female who presents today  over phone for:  evaluation of feeling tired and fatigue . She has h/o hypothyroidism, last time her TSH was 0.078, she was taking 75 microgram of levothyroixin, and she has some rashes over the buttock which she says was getting better. She is due for mammograms and colonoscopy exam. She has agreed  to do that.  Is there an updated release of information to speak for the patient on file ? -- yes   Specialists seen since last visit?-- No  Has previsit planning and a huddle been performed on this patient?-- yes       Did you self refer to any specialists?    Age: 73 Years Old  Last Secure Message:    Last PIN change:  03/03/2013         Labs/Vitals Last BP: 135/73 on 02/04/2019     Last eye exam on 08/26/2014.   DueLDL 104 on 02/04/2019 Triglycerides 227 on 02/04/2019  TSH 0.078 MCLU/ML on 02/04/2019 HGBA1C = HGBA1C 5.7 on 02/04/2019        Most Recent Visits     (Last rx/phone contact: 03/16/2019 )        02/04/2019  Cashus Halterman A. Demisha Nokes MD (.HPI)    08/26/2014   (Prob ck)                                      Next Appointment(s) Today   [facility]  9:00 AM      Immunizations   No flu vaccine on file      No Adult Tetanus/Adacel documented !   Recent Flu:  None      Mammogram:  Date of Exam:  Date of Exam: .   PAP: Date of Exam: .   Bone Density:  Date of Exam:    Colonoscopy:  Date  of Exam:  .  FIT Test:  Date:    Hemoccult:  Date:  .    Protocols Due  TDAP, MAMMOGRAM, PNEUMOVAX, ZOSTAVAX, COLONOSCOPY.           Directives 02/04/2019 ROXANNE COVALUCCI (DAUGHTER [phone]) IS THE HEALTHCARE PROXY ASSIGNED TO THE PATIENT.  SABRINA FUCILE (DAUGHTER [phone]) IS THE ALTERNATE AGENT FOR THE PATIENT.  PAPERWORK IS ON FILE.    The above represents the pre-visit preparations conducted for this patient.  ..................................................................Marland KitchenDawn M. Koleen Nimrod Avery  May 11, 2019 9:06 AM            Patient Instructions:  1)  Please schedule a follow-up appointment in 3 months. for hypothyroidism .  2)  Take the prescribed medication for treating low  thyroid and return for lab work and monitoring as scheduled.  3)  Needs to do Thyroid follow up   4)  Discussed importance of regular exercise and recommended starting or continuing a regular exercise program for good health.      Current Problems- Reviewed during today's visit  SCREENING, COLON CANCER (ICD10-Z12.11)  SCREENING EXAM FOR BREAST CANCER (ICD10-Z12.39)  SKIN RASH ([id number])  ANXIETY (ICD10-F41.9)  FEMALE STRESS INCONTINENCE (ICD10-N39.3)  HYPERGLYCEMIA (ICD10-R73.9)  HYPERLIPIDEMIA (ICD10-E78.5)  HYPOTHYROID (ICD10-E03.9)  CATARACTS (ICD10-H26.9)  ARTHRITIS (ICD10-M19.90)    Current Medications- Reviewed during today's visit  LEVOTHYROXINE SODIUM 75 MCG ORAL TABLET: 1 by mouth daily  LOTRISONE 1-0.05 % EXTERNAL CREAM (CLOTRIMAZOLE-BETAMETHASONE): apply twice a day over the areas    Current Allergies- Reviewed during today's visit  * STRAWBERRIES (Critical)    Current Directives- Reviewed during today's visit  ROXANNE COVALUCCI (DAUGHTER [phone]) IS THE HEALTHCARE PROXY ASSIGNED TO THE PATIENT.  SABRINA FUCILE (DAUGHTER [phone]) IS THE ALTERNATE AGENT FOR THE PATIENT.  PAPERWORK IS ON FILE.    Past Medical History  cataracts  thyroid disease    Surgical History  cataract surgery  bladder  reconstruction  tubal ligation  appendecomy    Family History  mom-cataracts passed-heart/kidney complications  dad-stroke  sister -stroke passed  brother-agent orange passed      Social History  Marital Status:  widowed       Children: 4    Lives With:son/first husband       Occupation:  at home/retired        Risk Factors  Tobacco User: no  Smoking Status: never smoked  Drug use: no  Alcohol use: no  HIV high risk behavior: no    Exercise: Yes  Type: walk daily  Times per week: 5    Caffeine (drinks/day): coffee/ 1 cup daily  Sun exposure: rarely  Seatbelt use (%): 100  Bladder Control  issues: no  Safety concerns: no  Falls Information:  Past year - no        Review of Systems   General: Complains of fatigue, weakness, sleep disorder. Denies fever, chills, sweats, anorexia, malaise, weight loss.   Eyes: Complains of visual change or blurring. Denies eye pain. She wears eye glasses for reading  Ears/Nose/Throat: Complains of nasal congestion. Denies earache, decreased hearing, difficulty swallowing.   Cardiovascular: Complains of fatigue, leg cramps. Denies chest pain or pressure, palpitations, shortness of breath.   Respiratory: Complains of dry cough, snoring. Denies productive cough, shortness of breath, wheezing.   Gastrointestinal: Complains of abdominal bloating, constipation. Denies acid indigestion, nausea, vomiting, diarrhea, abdominal pain, change in bowel habits, mucous or blood in stools.   Urologic: Denies dysuria, hematuria, frequency.   Musculoskeletal: Complains of morning stiffness, arthritis. Denies muscle cramps or aches, muscle weakness, joint pain, joint swelling.   Skin: Complains of rash. Denies dry skin, skin ulcers, suspicious lesions. over the both buttock  Neurologic: Denies dizziness, neck pain, headaches.   Psychiatric: Complains of anxiety, insomnia. Denies depression.   Endocrine: Denies polyuria, polydipsia, burning sensation in feet.   Heme/Lymphatic: Denies fevers, night sweats,  weight loss.     PATIENT HEALTH QUESTIONNAIRE-9  (PHQ-9)    Over the last 2 weeks, how often have you been bothered by any of the following problems?   1. Little interest or pleasure in doing things:  1   - Several days  2. Feeling down, depressed, or hopeless:  1   - Several days  3. Trouble falling or staying  asleep, or sleeping too much:  1   - Several days  4. Feeling tired or having little energy:  1   - Several days  5. Poor appetite or overeating:  1   - Several days  6. Feeling bad about yourself - or that you are a failure or have let yourself or your family down:  0   - Not at all  7. Trouble concentrating on things, such as reading the newspaper or watching television:  0   - Not at all  8. Moving or speaking so slowly that other people could have noticed?  Or the opposite - being so fidgety or restless that you have been moving   around a lot more than usual:  0   - Not at all  9. Thoughts that you would be better off dead or of hurting yourself in some way:  0   - Not at all    Total Score: 5      Vital Signs     Patient: 73 Years Old Female  Height:  63.5 in.  Weight: 136 lbs        BMI:  23.80        23.80 on 02/04/2019  Old BP:  135/73 on 02/04/2019  On Oxygen: No    Patient is not experiencing pain    Comments: Patient having a telephone visit today for follow up hypothyroid/rash.  patient states she is doing well, rash is much improved.  denies any fever, cough, sob, no headache, no bodyaches, no n/v/d.  diet/bm are good no changes, medications are good no refills  needed at this time.  patient agrees to having mammogram and colonoscopy and labs at [facility].  unable to obtain vitals today.  Patient states feels safe in life    Medications and Allergies Reviewed    Signed: Miachel Roux. Koleen Nimrod Harper....May 11, 2019 9:04 AM    PHQ 2    Over the last 2 weeks, how often have you been bothered by any of the following problems?  1. Little interest or pleasure in doing things:  1   - Several days  2. Feeling down,  depressed, or hopeless:  1   - Several days    This treatment is voluntary and a claim will be submitted to your insurance company for payment. You agree there are certain limitations in providing your care in this manner in lieu of an in person visit. Some virtual applications are not considered HIPAA compliant and therefore not secure. If a platform is not HIPAA compliant you agree to use of this application for this visit.           Assessment and Plan:      ~ANXIETY  -- Her PHQ-9 score was 5, she has been worried about covid illness , She is mostly homebound , not going anywhere, she is afraid to catch Virus   She is unable to sleep at night , she is stable does not take any Behavioral consultation, nor any antidepressentv , I counseled her a quite bit , how to stay safe from COvid illness.     ~HYPOTHYROID  -- She looks like clinically hypothyroid , But I will . BMP check her TSH , glycohemoglobin   Medication(s): LEVOTHYROXINE SODIUM 75 MCG ORAL TABLET: 1 by mouth daily     ~SKIN RASH  -- over Buttock - looks like fungal - Lotrisone cream has been working , is getting better , , avoid allergic  contact over buttock        Med Compliance and SE's: Pt is compliant with meds with no side effects   Patient/Caregiver understand instructions and plan.    Patient Instructions    Please schedule a follow-up appointment in 3 months. for hypothyroidism .    Take the prescribed medication for treating low thyroid and return for lab work and monitoring as scheduled.    Needs to do Thyroid follow up   Discussed importance of regular exercise and recommended starting or continuing a regular exercise program for good health.

## 2019-05-18 ENCOUNTER — Ambulatory Visit

## 2019-05-18 LAB — HX LIPID PANEL FASTING
HX CHOLESTEROL (LIPR): 188 mg/dL (ref <=200)
HX HDL CHOLESTEROL: 40 mg/dL (ref >=40)
HX LDL CHOLESTEROL: 105 mg/dL (ref <=130)
HX TRIGLYCERIDES: 216 mg/dL — ABNORMAL HIGH (ref <=150)

## 2019-05-18 LAB — HX BASIC METABOLIC PANEL
HX ANION GAP: 6 (ref 3.0–11.0)
HX BICARBONATE: 25 mmol/L (ref 21.0–32.0)
HX BUN: 13 mg/dL (ref 8.0–23.0)
HX CALCIUM: 9 mg/dL (ref 8.5–10.5)
HX CHLORIDE: 108 mmol/L (ref 98.0–110.0)
HX CREATININE: 0.8 mg/dL (ref 0.55–1.3)
HX GLOMERULAR FR AFRICAN AMERICAN: 85
HX GLOMERULAR FR NON AFRICAN AMER: 74
HX GLUCOSE: 88 mg/dL (ref 70.0–110.0)
HX POTASSIUM: 4 mmol/L (ref 3.6–5.2)
HX SODIUM: 139 mmol/L (ref 136.0–146.0)

## 2019-05-18 LAB — HX GLYCOHEMOGLOBIN
HX ESTIMATED AVERAGE GLUCOSE: 105 mg/dL
HX HEMOGLOBIN A1C: 5.3 % (ref <=5.6)

## 2019-05-18 LAB — HX TSH WITH REFLEX: HX TSH WITH REFLEX: 1.78 u[IU]/mL (ref 0.358–3.74)

## 2019-05-21 ENCOUNTER — Ambulatory Visit

## 2019-05-21 NOTE — Progress Notes (Signed)
 [facility]   Onnie Graham, MD & Norval Morton, NP    Claris Gladden, MD        Janeece Riggers, MD    Tel: [phone]                 Tel: [phone]                          Tel: [phone]  [address]  [place], [place] [address]  Fax: [phone]       May 21, 2019      Carolyn Young  [address] [place], [place] [address]        RE:  TEST RESULTS    Dear Ms. Kitch:    I have carefully reviewed your most recent test results, and below is a summary of my findings.  Please take note of any instructions provided.    Electrolyte Studies:  Good.    Kidney Function Studies:  Good.    Cholesterol Studies:  Fair.  Cholesterol:  188    Goal:  < =200 mg/dL  HDL:  40    Goal:  > =02 mg/dL  LDL:  725    Goal:  < =130 mg/dL  [id number] Goal:  < =150 mg/dL    Blood Glucose Studies:  Good.  Blood Glucose:  88    Goal:  70-110  HGBA1C:  5.3    Goal:  <=5.6    Thyroid Studies:  Good.  TSH W/Reflex:  1.780 MCLU/ML    Goal:  0.358-3.740      Your recent labwork shows that your blood sugar and kidney functions are good.  Your triglycerides are slightly elevated.  Please follow a low fat diet and exercise daily    Please call our office if you have any questions.    Sincerely,    Claris Gladden, MD

## 2019-05-27 ENCOUNTER — Ambulatory Visit

## 2019-05-27 LAB — HX TROPONIN I
HX TROPONIN I: <0.015 — ABNORMAL LOW (ref 0.015–0.045)
HX TROPONIN I: <0.015 — ABNORMAL LOW (ref 0.015–0.045)
HX TROPONIN I: <0.015 — ABNORMAL LOW (ref 0.015–0.045)

## 2019-05-27 LAB — HX CBC W/DIFF
HX ABSOLUTE BASO COUNT AUTODIFF: 0.04 10*3/uL (ref 0.0–0.22)
HX ABSOLUTE EOS COUNT AUTODIFF: 0.29 10*3/uL (ref 0.0–0.45)
HX ABSOLUTE LYMPHS COUNT AUTODIFF: 3.91 10*3/uL (ref 0.74–5.04)
HX ABSOLUTE MONO COUNT AUTODIFF: 0.6 10*3/uL (ref 0.0–1.34)
HX ABSOLUTE NEUTRO COUNT AUTODIFF: 4.86 10*3/uL (ref 1.48–7.95)
HX BASOPHIL AUTOMATED: 0.4 %
HX EOSINOPHIL AUTOMATED: 3 %
HX HEMATOCRIT: 35.3 % — ABNORMAL LOW (ref 36.0–47.0)
HX HEMOGLOBIN: 10.9 g/dL — ABNORMAL LOW (ref 11.8–16.0)
HX IG AUTOMATED: 0.2 % (ref 0.0–2.0)
HX LYMPHOCYTE AUTOMATED: 40.2 %
HX MEAN CORP.HEMO.CONC.: 30.9 g/dL — ABNORMAL LOW (ref 31.0–37.0)
HX MEAN CORPUSCULAR HEMOGLOBIN: 23.9 pg — ABNORMAL LOW (ref 26.0–34.0)
HX MEAN CORPUSCULAR VOLUME: 77.4 fL — ABNORMAL LOW (ref 80.0–100.0)
HX MEAN PLATELET VOLUME: 9 fL — ABNORMAL LOW (ref 9.4–12.4)
HX MONOCYTE AUTOMATED: 6.2 %
HX NEUTROPHIL AUTOMATED: 50 %
HX PLATELET COUNT: 420 10*3/uL — ABNORMAL HIGH (ref 150.0–400.0)
HX RED BLOOD COUNT: 4.56 M/uL (ref 3.9–5.2)
HX RED CELL DISTRIBUTION WIDTH SD: 43.8 fL (ref 35.0–51.0)
HX WHITE BLOOD COUNT: 9.7 10*3/uL (ref 3.7–11.2)

## 2019-05-27 LAB — HX COMPREHENSIVE METABOLIC PANEL
HX ALBUMIN: 3.7 g/dL (ref 3.2–5.0)
HX ALKALINE PHOSPHATASE: 127 U/L — ABNORMAL HIGH (ref 30.0–117.0)
HX ALT: 24 U/L (ref 6.0–55.0)
HX ANION GAP: 6 mmol/L (ref 3.0–11.0)
HX AST: 18 U/L (ref 6.0–40.0)
HX BICARBONATE: 26 mmol/L (ref 21.0–32.0)
HX BUN/CREAT RATIO: 13.9 (ref 12.0–20.0)
HX BUN: 12 mg/dL (ref 7.0–23.0)
HX CALCIUM: 9.5 mg/dL (ref 8.5–10.5)
HX CHLORIDE: 107 mmol/L (ref 98.0–110.0)
HX CREATININE: 0.86 mg/dL (ref 0.4–1.3)
HX GLOMERULAR FR AFRICAN AMERICAN: 79
HX GLOMERULAR FR NON AFRICAN AMER: 65
HX GLUCOSE: 109 mg/dL (ref 70.0–110.0)
HX POTASSIUM: 3.8 mmol/L (ref 3.6–5.2)
HX SODIUM: 139 mmol/L (ref 136.0–146.0)
HX TOTAL BILIRUBIN: 0.4 mg/dL (ref 0.2–1.2)
HX TOTAL PROTEIN: 7.6 g/dL (ref 6.0–8.4)

## 2019-05-27 LAB — HX COVID19 BY PCR CIRCLE HEALTH: HX COVID19 RESULT: NOT DETECTED

## 2019-05-27 LAB — HX CKMB: HX CKMB: 1 ng/mL (ref <6)

## 2019-05-27 LAB — HX MAGNESIUM: HX MAGNESIUM: 2.3 mg/dL (ref 1.7–2.5)

## 2019-05-27 LAB — HX CPK ISOENZYMES
HX TOTAL CPK: 52 U/L (ref 29.0–143.0)
HX TOTAL CPK: 67 U/L (ref 29.0–143.0)
HX TOTAL CPK: 80 U/L (ref 29.0–143.0)

## 2019-05-27 LAB — HX PROLACTIN: HX PROLACTIN: 4.7 ng/mL

## 2019-05-27 LAB — HX RELATIVE % INDEX (CKMB): HX RELATIVE % INDEX (CKMB): 1.2 % (ref <5.0)

## 2019-05-28 ENCOUNTER — Ambulatory Visit

## 2019-06-01 ENCOUNTER — Ambulatory Visit: Admitting: Neurology

## 2019-06-01 DIAGNOSIS — R55 Syncope and collapse: Secondary | ICD-10-CM | POA: Insufficient documentation

## 2019-06-01 NOTE — Progress Notes (Signed)
 NEUROLOGY ENCOUNTER       History of Present Illness  PCP: Mohammad A. Hakim MD  Visit Reason: Consultation  HPI:  Face-to-face visit.  73 year old right-handed white female presents for neurological evaluation.  She underwent admission to [facility]  on July 3, stay overnight as a result of  witnessed syncopal episode.  Patient said she had regular routine in the morning, he had breakfast at [employer], went from grocery shopping and brought stuff home then she went to convenient store to play Keno, patient experience sudden dizziness and loss of consciousness.  When she woke up she heard people talking to her but did not have enough strength to get up.   She had stool incontinence, no urine incontinence, no tongue bite, patient had a single bout of diarrhea in the hospital.    Initially elevated blood pressure 170 systolic.  Stable on telemetry, echocardiogram ejection fraction 55-60 %, chest x-ray stable, carotid ultrasound - R ICA 50-69%, left ICA 70% stenosis, CTA if needed recommended.  CMP stable, CBC normal WBC, H/H 10.9/35.3, MCV 77.4, platelets 420.  Prolactin-4.7 normal.  CT of the head, cervical spine no significant abnormality.   No additional spells.    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   Eyes: Denies visual change or blurring, dificulty driving due poor vision.   Ears/Nose/Throat: Denies difficulty swallowing, dry mouth.   Cardiovascular: Denies chest pain or pressure, palpitations, shortness of breath.   Respiratory: Denies dry cough, productive cough, shortness of breath, wheezing.   Gastrointestinal: Denies acid indigestion, nausea, vomiting, diarrhea, abdominal pain, change in bowel habits, constipation.   Urologic: Denies dysuria, hematuria, frequency, urgency, flank pain, stress or urge incontinence, kidney stones.   GYN: Denies breast pain, breast mass, breast discharge, irregular menses, vaginal discharge, pelvic pain.   Musculoskeletal: Denies muscle cramps or aches, muscle weakness, joint pain, joint swelling.   Skin: Denies dry skin, rash, skin ulcers.   Endocrine: Denies skin changes, hair loss, weight gain, weight loss, cold intolerance, heat intolerance, polyuria, polydipsia, loss of libido, burning sensation in feet, hypoglycemic symptoms.  All other pertinent systems reviewed and are negative.      Vital Signs    Weight: 135 lb. (61.36 kg.)  Temperature: 97.9 deg  F. (36.6 deg C.)  Height: 63.5 in. Pulse Ox: 99    Blood Pressure: 155 /71 mm Hg.  Postion: sitting  Pulse Rate: 67  Blood Pressure #2: 162 /68 mm Hg.  Position:   standing  Pulse: 73    Physical Exam   Constitutional:  Alert, no acute distress, well hydrated, well developed, well nourished.    Skin:  Normal turgor, normal color, no rashes, no lesions, no unusual bruising.    Head:  atraumatic, normocephalic.    Eyes:  EOM intact, PERRLA, vision grossly normal.    Neck:  supple, no adenopathy, no masses,thyroid normal size, no thyroid tenderness or nodules, no JVD, no carotid bruits.    Cardiac:  peripheral pulses intact, no edema, no JVD  Musculoskeletal:  normal mobility,no deformities,normal alignment,normal gait.      Neurology Exam    Mental Status  Orientation: In All Spheres  Mood/ Affect: Appropriate  Commands: Follows two and three step commands    CN Function  CN II: The pupils are equal and reactive to light. Visual Fields are full VA is 20/20 bilaterally  CN III,IV,VI: The extraocular movements are normal. There is no abnormal nystagmus.   CN V: Facial sensation is intact. There is no atrophy of the muscles of mastication. The jaw opens in the midline.   CN VII: Facial nerve function is normal, Grade 1/6 on the House Brackmann Scale.   CN VIII: Hearing is intact to whispered words bilaterally.   CN IX, X: The uvula is in the midline and the palate elevates in the midline. Gag reflex is intact.  CN XI: The sternocleidomastoid and trapezius muscle strength are normal. There is no SCM or trapezius atrophy.   CN XII: The tongue is normal without atrophy. The tongue protrudes in the midline    Speech  Fluent without aphasia or dysarthria    Motor Exam  Motor Drift: no drift of arms  Motor Tone: Normal  Motor Tremor: None  Motor Power: full power throughout all limbs  Motor Reflexes: 1-2+ and symmetric    Gait  Gait Base: Normal    Sensation  Intact throughout    Coordination  Normal    Assessment and Plan     SYNCOPE  patient sustained syncopal episode most likely with some dehydration, she had fecal incontinence, and one bout of diarrhea in the hospital, otherwise no  loose stool.  never had seizures, single episode of syncope, no additional spells, feeling back to normal.  Plan- routine EEG, CT angiogram neck evaluation of?  Left ICA stenosis 70% on carotid ultrasound.  Discussed with patient to improve hydration, she does not like the taste of plain water, may use flavored water, mixed water with juice, Gatorade, ginger ale.  Additionally recommendation if needed in the future.  Call 911 was additional spells.  Initial evaluation I spent approximately 45 minutes in which 50% or more of the time was spent counseling or coordinating care of the patient's condition.      Patient's Instructions     Return to office in 6 weeks          [

## 2019-06-02 ENCOUNTER — Ambulatory Visit

## 2019-06-02 NOTE — Telephone Encounter (Signed)
 Phone Note -       Initial call taken by: Margette Fast [place],  June 02, 2019 2:21 PM  Initial Details of Call:  Pt called and stated she went to [facility] 05/27/19 last thursday pt fainted and landing on the floor and hit her head,  pt was told to call PCP for stapples removed in 10 days, pt aware Dr. Corrinne Eagle is out of the off today and he is seen in-off pt only on thusdays, I will review this message with him and she will get a call back from his [place] tomorow.      Follow-up #1  Details: Okay , can you call this patient when her 10 days is over or due for staple removal time , accordingly you give an appointment, may not need exact 10 days ,,   but please make sure we have staple removal kit .  Action: Phone call completed  By: Deeann Saint. Farren Landa MD ~ June 03, 2019 5:13 PM    Action: Phone call completed  By: Deeann Saint. Leata Dominy MD ~ June 03, 2019 5:14 PM    Follow-up #2  Details: Patient scheduled for in office on  Tuesday july 14th for removal of staples per Dr. Corrinne Eagle  By: Miachel Roux. Koleen Nimrod Robinette ~ June 04, 2019 9:09 AM

## 2019-06-08 ENCOUNTER — Ambulatory Visit

## 2019-06-08 NOTE — Progress Notes (Signed)
 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 , There was some oozing of blood , slightly superficial infection I cleaned removed all staple gH?P ---ave suture kit. There was mild evidence of infection , I gave antibiotics  Did you self refer to any specialists?    Age: 73 Years Old  Last Secure Message:  03/03/2013 - Secure Messaging: Patient Portal Pin Creator  Last PIN change:  03/03/2013         Labs/Vitals Last BP: 155/71 on 06/01/2019     Last eye exam on 08/26/2014.   DueLDL 105 on 05/18/2019 Triglycerides 216 on 05/18/2019  Ultra TSH 1.780 MCLU/ML on 05/18/2019 HGBA1C = HGBA1C 5.3 on 05/18/2019, HGBA1C 5.7 on 02/04/2019        Most Recent Visits     (Last rx/phone contact: 06/02/2019 )        05/11/2019  Makeshia Seat A. Glinda Natzke MD (.HPI)    06/01/2019  Memory Dance MD (.HPI)    02/04/2019  Mckinleigh Schuchart A. Micaiah Litle MD (.HPI)                                      Next Appointment(s) Today   [facility] [place]  11:00 AM  07/02/2019  [facility] Neurology  11:00 AM  08/17/2019  [facility]  8:30 AM      Immunizations   No flu vaccine on file      No Adult Tetanus/Adacel documented !   Recent Flu:  None      Mammogram:  Date of Exam:  Date of Exam: .   PAP: Date of Exam: .   Bone Density:  Date of Exam:    Colonoscopy:  Date of Exam:  .  FIT Test:  Date:    Hemoccult:  Date:  .    Protocols Due  HEP C AB, TDAP, MAMMOGRAM, PNEUMOVAX, ZOSTAVAX, COLONOSCOPY.           Directives 02/04/2019 ROXANNE COVALUCCI (DAUGHTER [phone])  IS THE HEALTHCARE PROXY ASSIGNED TO THE PATIENT.  SABRINA FUCILE (DAUGHTER [phone]) IS THE ALTERNATE AGENT FOR THE PATIENT.  PAPERWORK IS ON FILE.    The above represents the pre-visit preparations conducted for this patient.              Patient Instructions:  1)  Please schedule a follow-up appointment in 2 weeks. for cellulitis   2)  Keep the area dry and clean  3)  Take 400-600mg  of Ibuprofen (Advil, Motrin) every 4-6 hours as needed for relief of pain or comfort of fever.      Current Problems- Reviewed during today's visit  ABRASION, SCALP, INFECTED (ICD10-L08.89)  SYNCOPE (ICD10-R55)  SCREENING, COLON CANCER (ICD10-Z12.11)  SCREENING EXAM FOR BREAST CANCER (ICD10-Z12.39)  SKIN RASH (QVZ56-L87)  ANXIETY (ICD10-F41.9)  FEMALE STRESS INCONTINENCE (ICD10-N39.3)  HYPERGLYCEMIA (ICD10-R73.9)  HYPERLIPIDEMIA (ICD10-E78.5)  HYPOTHYROID (ICD10-E03.9)  CATARACTS (ICD10-H26.9)  ARTHRITIS (ICD10-M19.90)  Current Medications- Reviewed during today's visit  LEVOTHYROXINE SODIUM 75 MCG ORAL TABLET: 1 by mouth daily  LOTRISONE 1-0.05 % EXTERNAL CREAM (CLOTRIMAZOLE-BETAMETHASONE): apply twice a day over the areas  TYLENOL WITH CODEINE #3 300-30 MG ORAL TABLET (ACETAMINOPHEN-CODEINE): one by mouth 3 times a day or as needed  KEFLEX 500 MG ORAL CAPSULE (CEPHALEXIN): 1 by mouth 4 times daily until finished    Current Allergies- Reviewed during today's visit  * STRAWBERRIES (Critical)    Current Directives- Reviewed during today's visit  ROXANNE COVALUCCI (DAUGHTER [phone]) IS THE HEALTHCARE PROXY ASSIGNED TO THE PATIENT.  SABRINA FUCILE (DAUGHTER [phone]) IS THE ALTERNATE AGENT FOR THE PATIENT.  PAPERWORK IS ON FILE.    Past Medical History  cataracts  thyroid disease    Surgical History  cataract surgery  bladder reconstruction  tubal ligation  appendecomy    Family History  mom-cataracts passed-heart/kidney complications  dad-stroke  sister -stroke passed  brother-agent orange passed      Social  History  Marital Status:  widowed       Children: 4    Lives With:son/first husband       Occupation:  at home/retired        Risk Factors  Tobacco User: no  Smoking Status: never smoked  Drug use: no  Alcohol use: no  HIV high risk behavior: no    Exercise: Yes  Type: walk daily  Times per week: 5    Caffeine (drinks/day): coffee/ 1 cup daily  Sun exposure: rarely  Seatbelt use (%): 100  Bladder Control  issues: no  Safety concerns: no  Falls Information:  Past year - yes        Review of Systems   General: Complains of fatigue, weakness. Denies fever, chills, sweats, anorexia, malaise, weight loss.   Eyes: Complains of visual change or blurring. Denies eye pain. She wears eye glasses for reading  Ears/Nose/Throat: Complains of nasal congestion. Denies earache, decreased hearing, difficulty swallowing.   Cardiovascular: Complains of fatigue, leg cramps. Denies chest pain or pressure, palpitations, shortness of breath.   Respiratory: Complains of dry cough. Denies productive cough, shortness of breath, wheezing.   Gastrointestinal: Complains of acid indigestion, constipation. Denies nausea, vomiting, diarrhea, abdominal pain, change in bowel habits, mucous or blood in stools.   Urologic: Denies dysuria, hematuria, frequency.   Musculoskeletal: Denies muscle cramps or aches, muscle weakness, morning stiffness, joint pain, joint swelling. scalp pain   Skin: Complains of dry skin. Denies rash, skin ulcers, suspicious lesions.   Neurologic: Complains of dizziness, general weakness.   Psychiatric: Complains of anxiety, insomnia. Denies depression.   Endocrine: Denies polyuria, polydipsia, burning sensation in feet.   Heme/Lymphatic: Denies fevers, night sweats, weight loss.     Vital Signs     Patient: 73 Years Old Female  Height:  63.5 in.  Weight: 134 lbs      Wt Chg: -1 since 06/01/2019  BMI:  23.45        23.80 on 05/11/2019  BP:  131/76 right arm, normal cuff, seated     155/71 on 06/01/2019   Temp:  97.59  F     temporal  Pulse:  89         Resp:  16  Pulse Ox: 98 %  On Oxygen: No    Patient is not experiencing pain    Comments: Patient having a appt today for hospital follow up/staples removal.  Patient states she is doing well. Patient was seen at [facility]  on 05/27/19 for a fall and had staples put in at that time.     Fever no  SOB no  Headaches no  New loss of smell/taste no  Fatigue no  New cough(not related to chronic condition) no  New nasal congestion or runny nose (not related to seasonal allergies) no  Muscle aches no  Aches and Pains no  Sore throat no  Diarrhea no  Have you been tested for COVID? no  If so, Where (facility) ?  Date?  Covid Result:    Positive / Negative    Medications and Allergies Reviewed    Signed: Dawn M. Koleen Nimrod [place]....June 08, 2019 11:02 AM    PHQ 2    Over the last 2 weeks, how often have you been bothered by any of the following problems?  1. Little interest or pleasure in doing things:  0   - Not at all  2. Feeling down, depressed, or hopeless:  0   - Not at all          Physical Exam    General:      well developed, well nourished, in no acute distress.    Head:      overthe scapp there were 6 staples all  were removed  Eyes:      PERRL/EOM intact, conjunctiva and sclera clear with out nystagmus.    Ears:      TM's intact and clear with normal canals with grossly normal hearing.    Nose:      no deformity, discharge, inflammation, or lesions.    Mouth:      no deformity or lesions with good dentition.    Neck:      no masses, thyromegaly, or abnormal cervical nodes.    Chest Wall:      no deformities or breast masses noted.    Breasts:      Not examined   Lungs:      clear bilaterally to auscultation.    Heart:      non-displaced PMI, chest non-tender; regular rate and rhythm, S1, S2 without murmurs, rubs, or gallops  Abdomen:       normal bowel sounds; no hepatosplenomegaly no ventral,umbilical hernias or masses noted.    Rectal:       not examined   Genitalia:      Not examined   Msk:      no  deformity or scoliosis noted of thoracic or lumbar spine.    Pulses:      pulses normal in all 4 extremities.    Extremities:      no clubbing, cyanosis, edema, or deformity noted with normal full range of motion of all joints.    Neurologic:      no focal deficits, cranial nerves II-XII grossly intact with normal sensation, reflexes, coordination, muscle strength and tone.    Skin:       There was lacerated injury ovewr the scalp due to fall secondary to syncopal attack  Cervical Nodes:      no significant adenopathy.    Axillary Nodes:      no significant adenopathy.    Inguinal Nodes:      no significant adenopathy.    Psych:      alert and cooperative; normal mood and affect; normal attention span and concentration.  anxious.           Assessment and Plan:      ~ABRASION, SCALP, INFECTED  -- it was  cleaned all staples were removed .  Medication(s): KEFLEX 500 MG ORAL CAPSULE (CEPHALEXIN): 1 by mouth 4 times daily until finished     ~SYNCOPE  -- She suddenly had syncopal attack , it was thought that it was dehydration  causing low BP and then she fell and sustained lacreated injury - needeed 6 staples , NO evidence of seizures     Time I spent 25 minutes     Med Compliance and SE's: Pt is compliant with meds with no side effects   Patient/Caregiver understand instructions and plan.    Changes to Medication List Documented Today:   TYLENOL WITH CODEINE #3 300-30 MG ORAL TABLET (ACETAMINOPHEN-CODEINE) one by mouth 3 times a day or as needed; Route: ORAL  KEFLEX 500 MG ORAL CAPSULE (CEPHALEXIN) 1 by mouth 4 times daily until finished; Route: ORAL    Patient Instructions    Please schedule a follow-up appointment in 2 weeks. for cellulitis     Keep the area dry and clean  Take 400-600mg  of Ibuprofen (Advil, Motrin) every 4-6 hours as needed for relief of pain or comfort of fever.    Medications:  KEFLEX 500 MG ORAL CAPSULE (CEPHALEXIN) 1 by mouth 4 times daily until finished  #28 x 0   Route:ORAL   Entered and  Authorized by: Donalda Job A. Shakendra Griffeth MD   Signed by: Breon Diss A. Ricca Melgarejo MD on 06/08/2019   Method used: Print then Give to Patient   Note to Pharmacy: Route: ORAL;    RxID: [id number]  TYLENOL WITH CODEINE #3 300-30 MG ORAL TABLET (ACETAMINOPHEN-CODEINE) one by mouth 3 times a day or as needed  #12 x 0   Route:ORAL   Entered and Authorized by: Rolando Hessling A. Clark Clowdus MD   Signed by: Deward Sebek A. Rhen Kawecki MD on 06/08/2019   Method used: Print then Give to Patient   Note to Pharmacy: Route: ORAL;    RxID: [id number]

## 2019-06-10 ENCOUNTER — Ambulatory Visit

## 2019-06-10 NOTE — Progress Notes (Signed)
 History of Present Illness:  CC- 1) Lacerated injury back of scalp - staples were removed- it was infected, now looking better.           2) She has no fever.      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 She  denied any past h/o heart attack  She has no drainage from the scalp wound , she is being treated with keflex antibiotics  Is there an updated release of information to speak for the patient on file ? -- yes   Specialists seen since last visit?-- yes   Has previsit planning and a huddle been performed on this patient?-- yes         Patient Instructions:  1)  Please schedule a follow-up appointment in 2 months. for follow up syncopal attacks  2)  The importance of keeping the blood pressure at or below 130/80 to prevent stroke, heart attacks, kidney failure, blindness, and loss of limbs was reviewed.  3)  Please keep the lacerated wound  area dry   Did you self refer to any specialists?    Age: 73 Years Old  Last Secure Message:  03/03/2013 - Secure Messaging: Patient Portal Pin Creator  Last PIN change:  03/03/2013         Labs/Vitals Last BP: 131/76 on 06/08/2019     Last eye exam on 08/26/2014.   DueLDL 105 on 05/18/2019 Triglycerides 216 on 05/18/2019  Ultra TSH 1.780 MCLU/ML on 05/18/2019 HGBA1C = HGBA1C 5.3 on 05/18/2019, HGBA1C 5.7 on 02/04/2019        Most Recent Visits     (Last rx/phone contact: 06/08/2019 )        05/11/2019  Autumm Hattery A. Analina Filla MD (.HPI)    06/01/2019  Memory Dance MD (.HPI)    06/08/2019  Jessicia Napolitano A. Ines Rebel MD (.HPI)    02/04/2019  Svara Twyman A. Dynisha Due MD (BP ck)                                      Next Appointment(s) 07/02/2019  [facility] - Neurology  11:00 AM  08/17/2019  [facility] - Eliam Snapp  8:30 AM      Immunizations   No flu vaccine on file       No Adult Tetanus/Adacel documented !   Recent Flu:  None      Mammogram:  Date of Exam:  Date of Exam: .   PAP: Date of Exam: .   Bone Density:  Date of Exam:    Colonoscopy:  Date of Exam:  .  FIT Test:  Date:    Hemoccult:  Date:  .    Protocols Due  HEP C AB, TDAP, MAMMOGRAM, PNEUMOVAX, ZOSTAVAX, COLONOSCOPY.           Directives 02/04/2019 ROXANNE COVALUCCI (DAUGHTER [phone]) IS THE HEALTHCARE PROXY ASSIGNED TO THE PATIENT.  SABRINA FUCILE (DAUGHTER [phone]) IS THE ALTERNATE AGENT FOR THE PATIENT.  PAPERWORK IS ON FILE.    The above represents the pre-visit preparations conducted for this patient.              Current Problems- Reviewed during today's visit  ABRASION, SCALP, INFECTED (ICD10-L08.89)  SYNCOPE (ICD10-R55)  SCREENING, COLON CANCER (ICD10-Z12.11)  SCREENING EXAM FOR BREAST CANCER (ICD10-Z12.39)  SKIN RASH (UVO53-G64)  ANXIETY (ICD10-F41.9)  FEMALE STRESS INCONTINENCE (ICD10-N39.3)  HYPERGLYCEMIA (ICD10-R73.9)  HYPERLIPIDEMIA (ICD10-E78.5)  HYPOTHYROID (ICD10-E03.9)  CATARACTS (ICD10-H26.9)  ARTHRITIS (ICD10-M19.90)    Current Medications- Reviewed during today's visit  LEVOTHYROXINE SODIUM 75 MCG ORAL TABLET: 1 by mouth daily  LOTRISONE 1-0.05 % EXTERNAL CREAM (CLOTRIMAZOLE-BETAMETHASONE): apply twice a day over the areas  TYLENOL WITH CODEINE #3 300-30 MG ORAL TABLET (ACETAMINOPHEN-CODEINE): one by mouth 3 times a day or as needed  KEFLEX 500 MG ORAL CAPSULE (CEPHALEXIN): 1 by mouth 4 times daily until finished    Current Allergies- Reviewed during today's visit  * STRAWBERRIES (Critical)    Current Directives- Reviewed during today's visit  ROXANNE COVALUCCI (DAUGHTER [phone]) IS THE HEALTHCARE PROXY ASSIGNED TO THE PATIENT.  SABRINA FUCILE (DAUGHTER [phone]) IS THE ALTERNATE AGENT FOR THE PATIENT.  PAPERWORK IS ON FILE.    Past Medical History  cataracts  thyroid disease    Surgical History  cataract surgery  bladder reconstruction  tubal ligation  appendecomy    Family  History  mom-cataracts passed-heart/kidney complications  dad-stroke  sister -stroke passed  brother-agent orange passed      Social History  Marital Status:  widowed       Children: 4    Lives With:son/first husband       Occupation:  at home/retired        Risk Factors  Tobacco User: no  Smoking Status: never smoked  Drug use: no  Alcohol use: no  HIV high risk behavior: no    Exercise: Yes  Type: walk daily  Times per week: 5    Caffeine (drinks/day): coffee/ 1 cup daily  Sun exposure: rarely  Seatbelt use (%): 100  Bladder Control  issues: no  Safety concerns: no  Falls Information:  Past year - yes        Review of Systems   General: Complains of fatigue, sleep disorder. Denies fever, chills, sweats, anorexia, weakness, malaise, weight loss.   Eyes: Complains of visual change or blurring. Denies eye pain. She wears eye glasses for reading  Ears/Nose/Throat: Complains of nasal congestion. Denies earache, decreased hearing, difficulty swallowing.   Cardiovascular: Complains of fatigue, leg cramps. Denies chest pain or pressure, palpitations, shortness of breath.   Respiratory: Complains of dry cough, snoring. Denies productive cough, shortness of breath, wheezing.   Gastrointestinal: Complains of constipation. Denies acid indigestion, nausea, vomiting, diarrhea, abdominal pain, change in bowel habits, mucous or blood in stools.   Urologic: Denies dysuria, hematuria, frequency.   Musculoskeletal: Complains of muscle cramps or aches, morning stiffness. Denies muscle weakness, joint pain, joint swelling.   Skin: Complains of dry skin, skin ulcers. Denies rash, suspicious lesions. It was scalp infection following   Neurologic: Complains of general weakness.   Psychiatric: Complains of anxiety, insomnia. Denies depression.   Endocrine: Denies polyuria, polydipsia, burning sensation in feet.   Heme/Lymphatic: Denies fevers, night sweats, weight loss.     PATIENT HEALTH QUESTIONNAIRE-9  (PHQ-9)    Over the last 2 weeks,  how often have you been bothered by any of the following problems?   1. Little interest or pleasure in doing things:  1   - Several days  2. Feeling down, depressed, or hopeless:  1   - Several days  3. Trouble falling or staying asleep, or sleeping too much:  0   - Not at all  4. Feeling  tired or having little energy:  0   - Not at all  5. Poor appetite or overeating:  0   - Not at all  6. Feeling bad about yourself - or that you are a failure or have let yourself or your family down:  0   - Not at all  7. Trouble concentrating on things, such as reading the newspaper or watching television:  0   - Not at all  8. Moving or speaking so slowly that other people could have noticed?  Or the opposite - being so fidgety or restless that you have been moving   around a lot more than usual:  0   - Not at all  9. Thoughts that you would be better off dead or of hurting yourself in some way:  0   - Not at all    Total Score: 2      Vital Signs     Patient: 73 Years Old Female  Height:  63.5 in.  Weight: 134 lbs        BMI:  23.45        23.45 on 06/08/2019  BP:  138/78 right arm, normal cuff, seated, physician repeat     131/76 on 06/08/2019   Temp:  96.6  F    temporal  Pulse:  78         Resp:  16  Pulse Ox: 97 %  On Oxygen: No    Patient is not experiencing pain    Comments: Patient having a follow up for staples removal.  Fever no  SOB no  Headaches no  New loss of smell/taste no  Fatigue no  New cough(not related to chronic condition) no  New nasal congestion or runny nose (not related to seasonal allergies) no  Muscle aches no  Aches and Pains no  Sore throat no  Diarrhea no  Have you been tested for COVID? no  If so, Where (facility) ?  Date?  Covid Result:    Positive / Negative    Medications and Allergies Reviewed    Signed: Dawn M. Koleen Nimrod [place]....June 10, 2019 9:11 AM    PHQ 2    Over the last 2 weeks, how often have you been bothered by any of the following problems?  1. Little interest or pleasure in doing things:   1   - Several days  2. Feeling down, depressed, or hopeless:  1   - Several days          Physical Exam    General:      well developed, well nourished, in no acute distress.    Head:       scalp wound looks good , No drainage still some redness and swelling over the back of scalp    Eyes:      PERRL/EOM intact, conjunctiva and sclera clear with out nystagmus.    Ears:      TM's intact and clear with normal canals with grossly normal hearing.    Nose:      no deformity, discharge, inflammation, or lesions.    Mouth:      no deformity or lesions with good dentition.    Neck:      no masses, thyromegaly, or abnormal cervical nodes.    Chest Wall:      no deformities or breast masses noted.    Breasts:      Not examined  Lungs:  clear bilaterally to auscultation.    Heart:      non-displaced PMI, chest non-tender; regular rate and rhythm, S1, S2 without murmurs, rubs, or gallops  Abdomen:       normal bowel sounds; no hepatosplenomegaly no ventral,umbilical hernias or masses noted.    Rectal:      Not done  Genitalia:      Not done  Msk:      no deformity or scoliosis noted of thoracic or lumbar spine.    Pulses:      pulses normal in all 4 extremities.    Extremities:      no clubbing, cyanosis, edema, or deformity noted with normal full range of motion of all joints.    Neurologic:      no focal deficits, cranial nerves II-XII grossly intact with normal sensation, reflexes, coordination, muscle strength and tone.    Skin:       Back of scalp - infected wound - all staples were removed .  Cervical Nodes:      no significant adenopathy.    Axillary Nodes:      no significant adenopathy.    Inguinal Nodes:      no significant adenopathy.    Psych:      alert and cooperative; normal mood and affect; normal attention span and concentration.  anxious.           Assessment and Plan:      ~ABRASION, SCALP, INFECTED  -- still there is some swelling and redness, no drainage - advised she can wash her hair and complete the  couse of antibiotics.  Medication(s): KEFLEX 500 MG ORAL CAPSULE (CEPHALEXIN): 1 by mouth 4 times daily until finished        Med Compliance and SE's: Pt is compliant with meds with no side effects   Patient/Caregiver understand instructions and plan.    Patient Instructions    Please schedule a follow-up appointment in 2 months. for follow up syncopal attacks      The importance of keeping the blood pressure at or below 130/80 to prevent stroke, heart attacks, kidney failure, blindness, and loss of limbs was reviewed.    Please keep the lacerated wound  area dry

## 2019-06-17 ENCOUNTER — Ambulatory Visit

## 2019-06-17 NOTE — Discharge Summary (Signed)
 Discharge Summary      Imported By: Aviva Signs [place] 06/17/2019 3:29:04 PM    _____________________________________________________________________    External Attachment:      Type: Image      Comment: External Document

## 2019-06-18 ENCOUNTER — Ambulatory Visit

## 2019-06-28 ENCOUNTER — Ambulatory Visit

## 2019-06-28 NOTE — Telephone Encounter (Signed)
 Phone Note -       Initial call taken by: Phylliss Blakes Aurelia,  June 28, 2019 2:04 PM  Initial Details of Call:  BUN/Creatine labs needed for CT angio tomorrow. Will fax labs over to [facility]             Orders:  Added new Test order of BUCR - BUN/Creatine Ratio (BUCR) - Signed

## 2019-06-29 ENCOUNTER — Ambulatory Visit: Admitting: Neurology

## 2019-06-29 ENCOUNTER — Ambulatory Visit

## 2019-06-29 LAB — HX CREATININE & EGFR
HX CREATININE: 0.84 mg/dL (ref 0.4–1.3)
HX GLOMERULAR FR AFRICAN AMERICAN: 81
HX GLOMERULAR FR NON AFRICAN AMER: 67

## 2019-06-29 LAB — HX BUN: HX BUN: 16 mg/dL (ref 7.0–23.0)

## 2019-06-30 ENCOUNTER — Ambulatory Visit: Admitting: Neurology

## 2019-06-30 DIAGNOSIS — I6529 Occlusion and stenosis of unspecified carotid artery: Secondary | ICD-10-CM | POA: Insufficient documentation

## 2019-06-30 NOTE — Telephone Encounter (Signed)
 Phone Note -       Initial call taken by: Memory Dance MD,  June 30, 2019 1:54 PM  Initial Details of Call:  call pt, CTA neck done to f/u degree of carotid stenosis and  blood vessels masked by  swallowing artifact.  Needs to extablish the degree of carotid stenosi, any changes of small stroke.   Plan to do B MRI w/o, MRA I, E, d/w pt, then f/u w/ me.            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

## 2019-07-21 ENCOUNTER — Ambulatory Visit

## 2019-07-29 ENCOUNTER — Ambulatory Visit

## 2019-07-30 ENCOUNTER — Ambulatory Visit

## 2019-07-30 ENCOUNTER — Ambulatory Visit: Admitting: Neurology

## 2019-08-03 ENCOUNTER — Ambulatory Visit

## 2019-08-03 ENCOUNTER — Ambulatory Visit: Admitting: Neurology

## 2019-08-03 NOTE — Progress Notes (Signed)
 Did you self refer to any specialists?    Age: 73 Years Old  Last Secure Message:  03/03/2013 - Secure Messaging: Patient Portal Pin Creator  Last PIN change:  03/03/2013         Labs/Vitals Last BP: 138/78 on 06/10/2019     Last eye exam on 08/26/2014.   DueLDL 105 on 05/18/2019 Triglycerides 216 on 05/18/2019  Ultra TSH 1.780 MCLU/ML on 05/18/2019 HGBA1C = HGBA1C 5.3 on 05/18/2019, HGBA1C 5.7 on 02/04/2019        Most Recent Visits     (Last rx/phone contact: 08/03/2019 )        05/11/2019  Faylinn Schwenn A. Jamarri Vuncannon MD (Prob ck)    06/01/2019  Memory Dance MD (.HPI)    06/08/2019  Anaya Bovee A. Janzen Sacks MD (.HPI)    06/10/2019  Veronica Guerrant A. Rhiannan Kievit MD (.HPI)                                      Next Appointment(s) Today   [facility]  2:00 PM      Immunizations   No flu vaccine on file      No Adult Tetanus/Adacel documented !   Recent Flu:  None      Mammogram:  Date of Exam:  Date of Exam: .   PAP: Date of Exam: .   Bone Density:  Date of Exam:    Colonoscopy:  Date of Exam:  .  FIT Test:  Date:    Hemoccult:  Date:  .    Protocols Due  FLU VAX, HEP C AB, TDAP, MAMMOGRAM, PNEUMOVAX, ZOSTAVAX, COLONOSCOPY.           Directives 02/04/2019 ROXANNE COVALUCCI (DAUGHTER [phone]) IS THE HEALTHCARE PROXY ASSIGNED TO THE PATIENT.  SABRINA FUCILE (DAUGHTER [phone]) IS THE ALTERNATE AGENT FOR THE PATIENT.  PAPERWORK IS ON FILE.    The above represents the pre-visit preparations conducted for this patient.  ..................................................................Marland KitchenDawn M. Koleen Nimrod [place]  August 03, 2019 11:51 AM            History of Present Illness:  CC---- Follow up of her syncopal attack.      Carolyn Young is a 73 year old Female who presents today for: evaluationof her syncopal attack .She fell on July2,2020, while she was standing near lottery machine , she stood up and she fell again . She gained consciouness quickly , and was taken to [facility] and admitted overnight and got  discharged  next day . after 10  days , she was here in this office to remeove the staples .  Is there an updated release of information to speak for the patient on file ? -- yes   Specialists seen since last visit?  Has previsit planning and a huddle been performed on this patient?-- yes .              Patient Instructions:  1)  Please schedule a follow-up appointment in 4  months. for hypothyroidism.  2)  Take the prescribed medication for treating low thyroid and return for lab work and monitoring as scheduled.  3)  Discussed importance of regular exercise and recommended starting or continuing a regular exercise program for good health.      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)  ABRASION, SCALP, INFECTED (ICD10-L08.89)  SYNCOPE (ICD10-R55)  SCREENING, COLON  CANCER (ICD10-Z12.11)  SCREENING EXAM FOR BREAST CANCER (ICD10-Z12.39)  SKIN RASH (LOV56-E33)  ANXIETY (ICD10-F41.9)  FEMALE STRESS INCONTINENCE (ICD10-N39.3)  HYPERGLYCEMIA (ICD10-R73.9)  HYPERLIPIDEMIA (ICD10-E78.5)  HYPOTHYROID (ICD10-E03.9)  CATARACTS (ICD10-H26.9)  ARTHRITIS (ICD10-M19.90)    Current Medications- Reviewed during today's visit  AMLODIPINE BESYLATE 2.5 MG ORAL TABLET:   LEVOTHYROXINE SODIUM 50 MCG ORAL TABLET: 1 by mouth daily    Current Allergies- Reviewed during today's visit  * STRAWBERRIES (Critical)    Current Directives- Reviewed during today's visit  ROXANNE COVALUCCI (DAUGHTER [phone]) IS THE HEALTHCARE PROXY ASSIGNED TO THE PATIENT.  SABRINA FUCILE (DAUGHTER [phone]) IS THE ALTERNATE AGENT FOR THE PATIENT.  PAPERWORK IS ON FILE.    Past Medical History  cataracts  thyroid disease    Surgical History  cataract surgery  bladder reconstruction  tubal ligation  appendecomy    Family History  mom-cataracts passed-heart/kidney complications  dad-stroke  sister -stroke passed  brother-agent orange passed      Social History  Marital Status:  widowed       Children: 4    Lives  With:son/first husband       Occupation:  at home/retired        Risk Factors  Tobacco User: no  Smoking Status: never smoked  Drug use: no  Alcohol use: no  HIV high risk behavior: no    Exercise: Yes  Type: walk daily  Times per week: 5    Caffeine (drinks/day): coffee/ 1 cup daily  Sun exposure: rarely  Seatbelt use (%): 100  Bladder Control  issues: no  Safety concerns: no  Falls Information:  Past year - yes        Review of Systems   General: Complains of fatigue, sleep disorder. Denies fever, chills, sweats, anorexia, weakness, malaise, weight loss.   Eyes: Complains of visual change or blurring. Denies eye pain. She wearing eye glasses for watching TV  Ears/Nose/Throat: Complains of nasal congestion. Denies earache, decreased hearing, difficulty swallowing.   Cardiovascular: Complains of fatigue, leg cramps. Denies chest pain or pressure, palpitations, shortness of breath.   Respiratory: Complains of dry cough, snoring. Denies productive cough, shortness of breath, wheezing.   Gastrointestinal: Complains of constipation. Denies acid indigestion, nausea, vomiting, diarrhea, abdominal pain, change in bowel habits, mucous or blood in stools.   Urologic: Denies dysuria, hematuria, frequency.   Musculoskeletal: Complains of muscle cramps or aches. Denies muscle weakness, morning stiffness, joint pain, joint swelling.   Skin: Complains of dry skin. Denies rash, skin ulcers, suspicious lesions.   Neurologic: Complains of dizziness, general weakness. Denies neck pain, headaches.   Psychiatric: Complains of anxiety, insomnia. Denies depression.   Endocrine: Denies polyuria, polydipsia, burning sensation in feet.   Heme/Lymphatic: Denies fevers, night sweats, weight loss.     Vital Signs     Patient: 73 Years Old Female  Height:  63.5 in.  Weight: 137 lbs      Wt Chg: 3 since 06/10/2019  BMI:  23.97        23.45 on 06/10/2019  BP:  135/78 left arm, normal cuff, seated, physician repeat     138/78 on 06/10/2019    Temp:  97.59  F    temporal  Pulse:  68       regular  Resp:  16  Pulse Ox: 98 %  On Oxygen: No    Patient is not experiencing pain    Comments: patient here for follow up syncopal attack  Patient states feels safe in life  Medications and Allergies Reviewed    Signed: Dawn M. Koleen Nimrod [place].Marland KitchenMarland KitchenMarland KitchenSeptember  8, 2020 1:55 PM    PHQ 2    Over the last 2 weeks, how often have you been bothered by any of the following problems?  1. Little interest or pleasure in doing things:  0   - Not at all  2. Feeling down, depressed, or hopeless:  0   - Not at all    Fever no  SOB no  Headaches no  New loss of smell/taste no  Fatigue no  New cough(not related to chronic condition) no  New nasal congestion or runny nose (not related to seasonal allergies) no  Muscle aches no  Aches and Pains no  Sore throat no  Diarrhea no  Have you been tested for COVID? yes  If so, Where (facility) ?  Date?  Covid Result:    Positive / Negative      Physical Exam    General:      well developed, well nourished, in no acute distress. weight is 137Lbs  Head:      normocephalic and atraumatic.    Eyes:      PERRL/EOM intact, conjunctiva and sclera clear with out nystagmus.    Ears:      TM's intact and clear with normal canals with grossly normal hearing.    Nose:      no deformity, discharge, inflammation, or lesions.    Mouth:      no deformity or lesions with good dentition.    Neck:      no masses, thyromegaly, or abnormal cervical nodes.    Chest Wall:      no deformities or breast masses noted.    Breasts:      Not examined   Lungs:      clear bilaterally to auscultation.    Heart:      non-displaced PMI, chest non-tender; regular rate and rhythm, S1, S2 without murmurs, rubs, or gallops  Abdomen:       normal bowel sounds; no hepatosplenomegaly no ventral,umbilical hernias or masses noted.    Rectal:      Not   Genitalia:      Not examined  Msk:      no deformity or scoliosis noted of thoracic or lumbar spine.    Pulses:      pulses normal in all  4 extremities.    Extremities:      no clubbing, cyanosis, edema, or deformity noted with normal full range of motion of all joints.    Neurologic:      no focal deficits, cranial nerves II-XII grossly intact with normal sensation, reflexes, coordination, muscle strength and tone.    Skin:      intact without lesions or rashes.    Cervical Nodes:      no significant adenopathy.    Axillary Nodes:      no significant adenopathy.    Inguinal Nodes:      no significant adenopathy.    Psych:      alert and cooperative; normal mood and affect; normal attention span and concentration.           Assessment and Plan:      ~SYNCOPE  -- her etiology of her syncopal attack is unknown, only she has fluctuation of her systolic BP-- No evidence of seizure disorders.  For Blood pressure low dose of amlodipine prescribed .     ~HYPOTHYROID  -- her TSH level  is pointing towards overactive , so she was taking levothyroixin 75 micrograms  a day and now from tomorrow her dose is further reduced . it was 75 micrograms - now Levothyroixin 50 MCG/ per day was given.  Medication(s): LEVOTHYROXINE SODIUM 50 MCG ORAL TABLET: 1 by mouth daily        Med Compliance and SE's: Pt is compliant with meds with no side effects   Patient/Caregiver understand instructions and plan.    Medications Removed Today:   LEVOTHYROXINE SODIUM 75 MCG ORAL TABLET (LEVOTHYROXINE SODIUM) 1 by mouth daily; Route: ORAL  LOTRISONE 1-0.05 % EXTERNAL CREAM (CLOTRIMAZOLE-BETAMETHASONE) apply twice a day over the areas; Route: EXTERNAL  TYLENOL WITH CODEINE #3 300-30 MG ORAL TABLET (ACETAMINOPHEN-CODEINE) one by mouth 3 times a day or as needed; Route: ORAL    Changes to Medication List Documented Today:   AMLODIPINE BESYLATE 2.5 MG ORAL TABLET (AMLODIPINE BESYLATE) ; Route: ORAL  LEVOTHYROXINE SODIUM 50 MCG ORAL TABLET (LEVOTHYROXINE SODIUM) 1 by mouth daily; Route: ORAL    Patient Instructions    Please schedule a follow-up appointment in 4  months. for  hypothyroidism.    Take the prescribed medication for treating low thyroid and return for lab work and monitoring as scheduled.    Discussed importance of regular exercise and recommended starting or continuing a regular exercise program for good health.    Medications:  LEVOTHYROXINE SODIUM 50 MCG ORAL TABLET (LEVOTHYROXINE SODIUM) 1 by mouth daily  #90 x 3   Route:ORAL   Entered and Authorized by: Conal Shetley A. Luceil Herrin MD   Signed by: Jessicamarie Amiri A. Ainsley Sanguinetti MD on 08/03/2019   Method used: Faxed to ...     [facility] [phone]* (retail)     [address]     [place], [place]  [address]     Ph: [phone] or [phone]     Fax: [phone]   Note to Pharmacy: Route: ORAL;    RxID: [id number]  LEVOTHYROXINE SODIUM 75 MCG ORAL TABLET (LEVOTHYROXINE SODIUM) 1 by mouth daily  #90 x 3   Route:ORAL   Entered and Authorized by: Shannie Kontos A. Selicia Windom MD   Signed by: Renella Steig A. Yanett Conkright MD on 08/03/2019   Method used: Faxed to ...     [facility] [phone]* (retail)     [address]     [place], [place]  [address]     Ph: [phone] or [phone]     Fax: [phone]   Note to Pharmacy: Route: ORAL;    RxID: [id number]  Cancelled LOTRISONE 1-0.05 % EXTERNAL CREAM (CLOTRIMAZOLE-BETAMETHASONE) apply twice a day over the areas  #45[Tube] x 1   Route:EXTERNAL   Entered and Authorized by: Binta Statzer A. Courtnie Brenes MD   Signed by: Logon Uttech A. Jilliann Subramanian MD on 08/03/2019   Method used: Electronically to      [id number] (retail)     [address]     [place], [place]  [address]     Ph: [phone] or [phone]     Fax: [phone]   RxID: [id number]  AMLODIPINE BESYLATE 2.5 MG ORAL TABLET (AMLODIPINE BESYLATE)   #90 x 3   Route:ORAL   Entered and Authorized by: Mailin Coglianese A. Cortina Vultaggio MD   Signed by: Garima Chronis A. Oryn Casanova MD on 08/03/2019   Method used: Faxed to ...     [facility] [phone]* (retail)     [address]     [place], [place]  [address]     Ph: [phone] or [phone]     Fax: [phone]   Note  to Pharmacy: Route: ORAL;    RxID: [id number]

## 2019-08-03 NOTE — Telephone Encounter (Signed)
 Phone Note -     Patient    **Informational purposes only**    Call back at [phone]  Initial call taken by: Alfredia Client (Patient Access Center),  August 03, 2019 9:10 AM  Initial Details of Call:  Patient canceled appointment for today as Dr Corrinne Eagle does not accept her insurance       Follow-up #1  Details: keeping appointment spoke with billing we do take aenta medicare  By: Denton Lank ~ August 03, 2019 9:41 AM

## 2019-08-03 NOTE — Telephone Encounter (Signed)
 Phone Note -       Initial call taken by: Memory Dance MD,  August 03, 2019 10:09 AM  Initial Details of Call:  call pt, CTA, US carotids, MRA no evidence of stenosis above 70 %, initially seen on US carotid R ICA 50-69%, L ICA 70 % , s/p syncope( vasovagal eval) eval. CTA done, but read as limited. B MRI, MRA neck no acute stroke, no stenosis above 70 % BL.   D/w pt, stable.

## 2019-08-09 ENCOUNTER — Ambulatory Visit

## 2019-08-10 ENCOUNTER — Ambulatory Visit

## 2019-08-10 NOTE — Telephone Encounter (Signed)
 Phone Note -     Patient    Initial call taken by: Allena Earing. Chilton Greathouse (Patient Access Center),  August 10, 2019 3:54 PM  Actual Caller: Patient  Call For: Nurse  Initial Details of Call:  Patient is calling regarding to:  AMLODIPINE BESYLATE 2.5 MG ORAL TABLET (AMLODIPINE BESYLATE)   #90 x 3  PHarmacy is saying that did not received the request, please resend again to:     [id number] ([facility]ail[facility]     [address]     [place], [place]  [address]     Ph: [phone] or [phone]     Fax: [phone]   Pt [phone]        Medications:  LEVOTHYROXINE SODIUM 50 MCG ORAL TABLET (LEVOTHYROXINE SODIUM) 1 by mouth daily  #90 x 3   Route:ORAL   Entered and Authorized by: Jerame Hedding A. Desarai Barrack MD   Signed by: Braylin Formby A. Gabrien Mentink MD on 08/10/2019   Method used: Faxed to ...     [phone]     [address]     [place], [place]  [address]     Ph: [phone] or [phone]     Fax: [phone]   Note to Pharmacy: Route: ORAL;    RxID: [id number]  LEVOTHYROXINE SODIUM 50 MCG ORAL TABLET (LEVOTHYROXINE SODIUM) 1 by mouth daily  #90[Tablet] x 3   Route:ORAL   Entered and Authorized by: Prosperity Darrough A. Ruthe Roemer MD   Signed by: Texanna Hilburn A. Nea Gittens MD on 08/10/2019   Method used: Electronically to      [id number]     [address]     [place], [place]  [address]     Ph: [phone] or [phone]     Fax: [phone]   Note to Pharmacy: Route: ORAL;    RxID: [id number]  AMLODIPINE BESYLATE 2.5 MG ORAL TABLET (AMLODIPINE BESYLATE)   #90[Tablet] x 3   Route:ORAL   Entered and Authorized by: Cataleia Gade A. Marylouise Mallet MD   Signed by: Jelissa Espiritu A. Jeron Grahn MD on 08/10/2019   Method used: Faxed to ...     [phone]     [address]     [place], [place]  [address]     Ph: [phone] or [phone]     Fax: [phone]   Note to Pharmacy: Route: ORAL;    RxID: [id number]        Medications:  Rx of AMLODIPINE BESYLATE 2.5 MG ORAL TABLET (AMLODIPINE BESYLATE) ; Route: ORAL  #90 x 3;  Signed;  Entered by: Brees Hounshell A. Shammond Arave MD;   Authorized by: Romie Tay A. Eliberto Sole MD;  Method used: [id number] [address] [place], [place]  [address], Ph: [phone] or [phone], Fax: [phone]; Note to Pharmacy: Route: ORAL;  Rx of LEVOTHYROXINE SODIUM 50 MCG ORAL TABLET (LEVOTHYROXINE SODIUM) 1 by mouth daily; Route: ORAL  #90[Tablet] x 3;  Signed;  Entered by: Ladavia Lindenbaum A. Landis Cassaro MD;  Authorized by: Rieley Khalsa A. Deloy Archey MD;  Method used: [id number] [address] [place], [place]  [address], Ph: [phone] or [phone], Fax: [phone]; Note to Pharmacy: Route: ORAL;  Rx of LEVOTHYROXINE SODIUM 50 MCG ORAL TABLET (LEVOTHYROXINE SODIUM) 1 by mouth daily; Route: ORAL  #90 x 3;  Signed;  Entered by: Cranford Blessinger A. Aisa Schoeppner MD;  Authorized by: Tarl Cephas A. Sajjad Honea MD;  Method used: [facility]ed [id number] [address], [place], [place]  [address], Ph: [phone] or [phone], Fax: [phone]; Note to Pharmacy: Route: ORAL;

## 2019-08-11 ENCOUNTER — Ambulatory Visit

## 2019-08-11 NOTE — Telephone Encounter (Signed)
 Phone Note -     Patient    Routine    Call back at [facility] [phone]  Initial call taken by: Lucretia Kern (Patient Access Center),  August 11, 2019 10:37 AM  Initial Details of Call:  Patient stated her pharmacy still has not recieved her refill requests patient is requesting for [facility] [address] [facility] [place]. Patient is requesitng a call back once it is sent to the pharmacy. Patient was unable to provide the names of the medications that need to be sent.    Patient can be reached at [phone]      Follow-up #1  Details: [facility]  By: Denton Lank ~ August 11, 2019 11:19 AM

## 2019-08-16 ENCOUNTER — Ambulatory Visit

## 2019-08-16 ENCOUNTER — Ambulatory Visit: Admitting: Ophthalmology

## 2019-08-16 NOTE — Progress Notes (Signed)
 PCP: Angela Cox MD     History of Present Illness:   f/u New Patient Presents for Cataract Eval: Per Pt; h/o Cataract . Pt does not drive   h/o PCIOL  OD  .... 2-3 years ago , Dr. Cruzita Lederer in [place].   LEE: 3 YRS .      Allergies - Reviewed  * STRAWBERRIES.      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      Smoking Status: no        Visual Acuity                      OD                          OS                           Vision:             20/200 Uncorrected          20/200 Uncorrected              Refraction:         20/30+                      20/30                         Rx:                 -1.25 -0.75 120 add: +2.50  +3.00 sph add: +2.50          Near:               OU-J1+                      OU-J1+                          Refinement:         20/30+                      20/30                         Rx:                 -1.25 -0.75 120 add: +2.50  +1.50 sph add: +2.50          Near:               OU-J1+                      OU-J1+                        Refine By:          Antonieta Loveless MD                                              Glasses  Rx:  Bifocal  UV Coating   Eye Exam  Dilation Time: 2:41 PM -- Patient was cautioned about the risks of dilation.                      OD                          OS                           Dilation:           Myd 1.0% & Phnyl 2.5% x2    Myd 1.0% & Phnyl 2.5% x2      Orbit:              wnl                         wnl                           Pupils:             Equal- Round-Reactive to    Equal- Round-Reactive to                          Light                       Light                         EOM:                Orthophoric                 normal                        Conj/scler:         No significant injection    No significant injection      Cornea:             Inferior Corneal Scar With  Inferior Corneal Scar  With                       Peripheral                   Peripheral                                        Neovascularization,         Neovascularization,                               Salzmann Nodule             Salzmann Nodule               AC:                 NL                          NL  Iris:               Patchy surg atrophy nasal , hazel                                               hazel                                                   Lens:               PCIOL in the sulcus,        2++ NS and Cort.                                  slightly superior                                         Vitreous:           NL                          NL                            Optic Nerve:        NL                          NL                            Cup Size:           0.55                        0.50                          Macula:             NL                          good                          Vessels:            NL                          NL                            Periphery:          NL                          NL  Technician during exam: [employer]..August 16, 2019 2:16 PM      IOP:   Right IOP: 16  -- 2:31 PM - AN  Left IOP: 16 -- 2:31 PM - AN            Assessment & Plan:  1) Moderate cataract OS. Pt does not drive. 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 Will need Phaco OS in the near future.   s/p Phaco OD w/ PCIOL in sulcus (2017/2018 ? , Dr. Cruzita Lederer)   2) [facility] Suspect based on discs. ***Order OCT GL and HVF 24-2, then assess cataract OS***  f/u: 1 month   Route to PMD Angela Cox MD

## 2019-08-17 ENCOUNTER — Ambulatory Visit

## 2019-08-17 NOTE — Progress Notes (Signed)
 Did you self refer to any specialists?    Age: 73 Years Old  Last Secure Message:    Last PIN change:  03/03/2013         Labs/Vitals Last BP: 135/78 on 08/03/2019     Last eye exam on 08/26/2014.   DueLDL 105 on 05/18/2019 Triglycerides 216 on 05/18/2019  Ultra TSH 1.780 MCLU/ML on 05/18/2019 HGBA1C = HGBA1C 5.3 on 05/18/2019, HGBA1C 5.7 on 02/04/2019        Most Recent Visits     (Last rx/phone contact: 08/16/2019 )        06/08/2019  Mohammad A. Hakim MD (.HPI)    06/10/2019  Mohammad A. Hakim MD (.HPI)    08/03/2019  Mohammad A. Hakim MD (.HPI)                                      Next Appointment(s) 11/09/2019  [facility] - Osie Bond  9:15 AM      Immunizations       No Adult Tetanus/Adacel documented !   Last Flu  08/03/2019      Mammogram: MGSCRTOMO Date of Exam: 07/29/2019 Date of Exam: .   PAP: Date of Exam: .   Bone Density:  Date of Exam:    Colonoscopy:  Date of Exam:  .  FIT Test:  Date:    Hemoccult:  Date:  .    Protocols Due  HEP C AB, TDAP, PNEUMOVAX, ZOSTAVAX, COLONOSCOPY.           Directives 02/04/2019 ROXANNE COVALUCCI (DAUGHTER [phone]) IS THE HEALTHCARE PROXY ASSIGNED TO THE PATIENT.  SABRINA FUCILE (DAUGHTER [phone]) IS THE ALTERNATE AGENT FOR THE PATIENT.  PAPERWORK IS ON FILE.    The above represents the pre-visit preparations conducted for this patient.  ..................................................................Marland KitchenDawn M. Koleen Nimrod [place]  August 17, 2019 8:07 AM            Visit Type:  Follow-up Visit  Primary Provider:  Osie Bond MD      History of Present Illness:  Pt is a delightful 73 yr old Spry lady with intact cognition,Pt of Dr Corrinne Eagle ,[facility] presented today for a previously scheduled visit nbut with new   concern as well.    -Scalp wound/abrasion  area Bump and tenderness that at times interferes with sleep.Pt had suffered a fall s/p syncopal episode 2 months ago withresultant scalp laceration.She received several stitches.Her wound had  become infected 2 months bacvk but improved with antibiotic therapy.She denies any drainage.  Her workup for syncope has been negative so far.Neurologist felt it to be likely  relatedto dehydration.  Pt acknowledges not drinking enough water since does not like it devoid of taste.    -Low normal BP-PT was prescribed low dose Amlodipine 2 weeks ago for borderline BP  but only took it yesterday due to receiving script late..  She has not taken BP med today and her Blood Pressure bilaterally was  low/normal at 110/66.Pt feels well without headaches,lightheadedness,palpitations.    -HYpothyroidism-Her Levothyroxine dose was adjusted 2 weeks ago ,reduced to 50 mcg for  concern reg suppressed THyroid.She denies any heatr/cold intolerance,dry skin.    -Hx of Syncope-Negative workup so far.Pt has remained asymptomatic.      Patient Instructions:  1)  THE STAFF WILL BE MAKING SOME APPOINTMENTS FOR YOU:  2)  FOLLOW UP 2 WEEKS FOR BP CHECK,SCALP  WOUND CHECK  3)  WE HAVE MADE SOME CHANGES IN YOUR MEDICATIONS:  4)  STOP AMLODIPINBE  5)  START KEFLEX 500 MG EVERY 8 HOURS WITH FOOD FOR 7 DAYS  6)  USE PROBIOTIC OR ACTIVIA YOGURT DAILY  7)  WE DISCUSSED SOME STRATEGIES AND GOALS TO IMPROVE OR MAINTAIN YOUR OVERALL HEALTH:  8)  KEEP YOURSELF WELL HYDRATED      Current Directives- Reviewed during today's visit  ROXANNE COVALUCCI (DAUGHTER [phone]) IS THE HEALTHCARE PROXY ASSIGNED TO THE PATIENT.  SABRINA FUCILE (DAUGHTER [phone]) IS THE ALTERNATE AGENT FOR THE PATIENT.  PAPERWORK IS ON FILE.    Risk Factors  Bladder Control  issues: no  Safety concerns: no  Falls Information:  Past year - yes        Review of Systems   General: Denies fever, chills, sweats, anorexia, fatigue, weakness, malaise, weight loss.   Eyes: Denies visual change or blurring, eye pain.   Ears/Nose/Throat: Denies earache, decreased hearing, difficulty swallowing.   Cardiovascular: Denies chest pain or pressure, palpitations, shortness of breath.    Respiratory: Denies dry cough, productive cough, shortness of breath, wheezing.   Gastrointestinal: Denies acid indigestion, nausea, vomiting, diarrhea, abdominal pain, change in bowel habits, constipation, mucous or blood in stools.   Urologic: Denies dysuria, hematuria, frequency, urgency, flank pain, stress or urge incontinence.   GYN: Denies breast pain, breast mass, breast discharge, irregular menses, vaginal discharge, pelvic pain.   Musculoskeletal: Denies muscle cramps or aches, muscle weakness, morning stiffness, joint pain, joint swelling.   Skin: Scalp  abrasion area tenderness/swelling  Neurologic: Denies memory loss, parasthesias, dizziness, headaches, transient weakness.   Psychiatric: Denies anxiety, depression, insomnia.   Endocrine: Denies skin changes, hair loss, weight gain, weight loss, cold intolerance, heat intolerance, polyuria, polydipsia, loss of libido.   Heme/Lymphatic: Denies easy bruising, fatigue, unusual bleeding, fevers, night sweats.     Vital Signs     Patient: 73 Years Old Female  Height:  63.5 in.  Weight: 138 lbs      Wt Chg: 1 since 08/03/2019  BMI:  24.15        23.97 on 08/03/2019  BP:  127/66 right arm, normal cuff, seated     135/78 on 08/03/2019     110/66 right arm, normal cuff, seated, physician repeat    108/65 left arm, normal cuff, seated, physician repeat  Temp:  97.3  F    temporal  Pulse:  81           76           78         Resp:  16  Pulse Ox: 100 %  On Oxygen: No    Patient is not experiencing pain    Comments: Patient having a follow up for hypothyroid and syncope  Patient states feels safe in life    Medications and Allergies Reviewed    Signed: Dawn M. Koleen Nimrod [place].Marland KitchenMarland KitchenMarland KitchenSeptember 22, 2020 8:08 AM    PHQ 2    Over the last 2 weeks, how often have you been bothered by any of the following problems?  1. Little interest or pleasure in doing things:  0   - Not at all  2. Feeling down, depressed, or hopeless:  0   - Not at all    Fever no  SOB no  Headaches no  New  loss of smell/taste no  New cough(not related to chronic condition) no  Fatigue no  New nasal congestion or runny  nose (not related to seasonal allergies) no  Muscle aches no  Aches and Pains no  Sore throat no  Diarrhea no  Have you been tested for COVID?  If so, Where (facility) ?  Date?  Covid Result:    Positive / Negative      Physical Exam    General:      well developed, well nourished, in no acute distress.    Head:      Normocephalic   PARIEIAL AREA 1 INCH AREA OF ERYTHEMA,SWELLING,NO FLUTUATION,NO DRAINING SINUS  Eyes:      GLASSES,  PERRL/EOM intact, conjunctiva and sclera clear with out nystagmus.    Ears:      TM's intact and clear with normal canals with grossly normal hearing.    Nose:      no deformity, discharge, inflammation, or lesions.    Mouth:      no deformity or lesions with good dentition.    Neck:      no masses, thyromegaly, or abnormal cervical nodes.    Lungs:      clear bilaterally to auscultation.    Heart:      non-displaced PMI, chest non-tender; regular rate and rhythm, S1, S2 without murmurs, rubs, or gallops  Abdomen:       normal bowel sounds; no hepatosplenomegaly no ventral,umbilical hernias or masses noted.    Msk:      no deformity or scoliosis noted of thoracic or lumbar spine.    Pulses:      pulses normal in all 4 extremities.    Extremities:      no clubbing, cyanosis, edema, or deformity noted with normal full range of motion of all joints.    Neurologic:      no focal deficits, cranial nerves II-XII grossly intact with normal sensation, reflexes, coordination, muscle strength and tone.    Skin:      SCALP INFECTED ABRASION  Cervical Nodes:      no significant adenopathy.    Psych:      alert and cooperative; normal mood and affect; normal attention span and concentration.           Assessment and Plan:     1. ABRASION, SCALP, Site of Ptrevious injury-appears to be INFECTED -  Trial of Keflex 500mg  TIDX7 Days.  Advised pt to avoid touching area.  Use Shower cap for  bathing.  Use probiotic or Activia yogurt to help prevent GI sx related to antibiotic use.    2. HYPOTHYROID -Dosage adjusted only 2 weeks ago.  Will recheck TSH,FREET4 IN 2-4 WEEKS      3. LOW BLOOD PRESSURE, NOT HYPOTENSION -Pt's BP on repeated testing involving bilateral arms was on low normal side without takiung Amlodipine.  I am Concerned about hypotension on BP med and resulting syncope/fall.    Will stop amlodipine,pt was advised to stop taking.  recheck BP in 2 weeks off medication and then will decide about further management.  -Syncope-No recurrence-Counselled on keeping well hydrated.  Pt advised to notify office ,seek care immediatey if any concerning sympyoms.  /         Problems Reviewed  Med Compliance and SE's: Pt is compliant with meds with no side effects   Patient/Caregiver understand instructions and plan.    Medications Removed Today:   AMLODIPINE BESYLATE 2.5 MG ORAL TABLET (AMLODIPINE BESYLATE) one a day; Route: ORAL    Changes to Medication List Documented Today:   KEFLEX 500 MG ORAL CAPSULE (  CEPHALEXIN) 1 by mouth 3 times daily with food for 7days; Route: ORAL    Patient Instructions    THE STAFF WILL BE MAKING SOME APPOINTMENTS FOR YOU:    FOLLOW UP 2 WEEKS FOR BP CHECK,SCALP  WOUND CHECK        WE HAVE MADE SOME CHANGES IN YOUR MEDICATIONS:  STOP AMLODIPINBE  START KEFLEX 500 MG EVERY 8 HOURS WITH FOOD FOR 7 DAYS  USE PROBIOTIC OR ACTIVIA YOGURT DAILY    WE DISCUSSED SOME STRATEGIES AND GOALS TO IMPROVE OR MAINTAIN YOUR OVERALL HEALTH:      KEEP YOURSELF WELL HYDRATED    Medications:  KEFLEX 500 MG ORAL CAPSULE (CEPHALEXIN) 1 by mouth 3 times daily with food for 7days  #21[Capsule] x 0   Route:ORAL   Entered and Authorized by: Osie Bond MD   Signed by: Osie Bond MD on 08/17/2019   Method used: [phone]     [address]     [place], [place]  [address]     Ph: [phone] or [phone]     Fax: [phone]   Note to Pharmacy: Route: ORAL;     RxID: [id number]

## 2019-08-31 ENCOUNTER — Ambulatory Visit

## 2019-09-01 ENCOUNTER — Ambulatory Visit

## 2019-09-01 NOTE — Progress Notes (Signed)
 Pt had been notified by Radiology staff/Tech about her Mammogram result and  also about recommendation for repeating Mammogram/Ultrasound in 6 months.      Observations:  Added new observation of ENBSRVTYPENC: Quick Note (09/01/2019 11:31)

## 2019-09-01 NOTE — Progress Notes (Signed)
 Did you self refer to any specialists?    Age: 73 Years Old  Last Secure Message:  03/03/2013 - Secure Messaging: Patient Portal Pin Creator  Last PIN change:  03/03/2013         Labs/Vitals Last BP: 127/66 on 08/17/2019     Last eye exam on 08/26/2014.   DueLDL 105 on 05/18/2019 Triglycerides 216 on 05/18/2019  Ultra TSH 1.780 MCLU/ML on 05/18/2019 HGBA1C = HGBA1C 5.3 on 05/18/2019, HGBA1C 5.7 on 02/04/2019        Most Recent Visits     (Last rx/phone contact: 08/17/2019 )        06/10/2019  Mohammad A. Hakim MD (.HPI)    08/03/2019  Mohammad A. Hakim MD (.HPI)    08/17/2019  Osie Bond MD (.HPI)                                      Next Appointment(s) 11/09/2019  [facility] - Osie Bond  9:15 AM      Immunizations       No Adult Tetanus/Adacel documented !   Last Flu  08/03/2019      Mammogram: MGSCRTOMO Date of Exam: 07/29/2019 Date of Exam: .   PAP: Date of Exam: .   Bone Density:  Date of Exam:    Colonoscopy:  Date of Exam:  .  FIT Test:  Date:    Hemoccult:  Date:  .    Protocols Due  HEP C AB, TDAP, PNEUMOVAX, ZOSTAVAX, COLONOSCOPY.           Directives 02/04/2019 ROXANNE COVALUCCI (DAUGHTER [phone]) IS THE HEALTHCARE PROXY ASSIGNED TO THE PATIENT.  SABRINA FUCILE (DAUGHTER [phone]) IS THE ALTERNATE AGENT FOR THE PATIENT.  PAPERWORK IS ON FILE.    The above represents the pre-visit preparations conducted for this patient.  ..................................................................Marland KitchenDawn M. Bradd [place]  September 01, 2019 8:00 AM            Visit Type:  Acute Visit  Primary Provider:  Osie Bond MD      History of Present Illness:  Andi Hence-  Presentsfor concerns regarding  scalp wound healing.Has found blood on pillow in am.No pain,tenderness.Not sure if she has been scratching duri g sleep.        Current Directives- Reviewed during today's visit  ROXANNE COVALUCCI (DAUGHTER [phone]) IS THE HEALTHCARE PROXY ASSIGNED TO THE PATIENT.  SABRINA FUCILE (DAUGHTER  [phone]) IS THE ALTERNATE AGENT FOR THE PATIENT.  PAPERWORK IS ON FILE.    Risk Factors  Bladder Control  issues: no  Safety concerns: no  Falls Information:  Past year - yes        Review of Systems   General: Denies fever, chills, sweats, anorexia, fatigue, weakness, malaise, weight loss.   Eyes: Denies visual change or blurring, eye pain.   Cardiovascular: Denies chest pain or pressure, palpitations, shortness of breath.   Respiratory: Denies dry cough, productive cough, shortness of breath, wheezing.   Gastrointestinal: Denies acid indigestion, nausea, vomiting, diarrhea, abdominal pain, change in bowel habits, constipation, mucous or blood in stools.   Musculoskeletal: Denies muscle cramps or aches, muscle weakness, morning stiffness, joint pain, joint swelling.   Skin: Scalp wound  Neurologic: Denies memory loss, parasthesias, dizziness, headaches, transient weakness.     Vital Signs     Patient: 73 Years Old Female  Height:  63.5 in.  Weight: 138 lbs  BMI:  24.15        24.15 on 08/17/2019  BP:  120/71 right arm, normal cuff, seated     127/66 on 08/17/2019   Temp:  97.59  F    temporal  Pulse:  82         Resp:  16  Pulse Ox: 98 %  On Oxygen: No    Patient is experiencing Pain  Location: sculp    Intensity: 7    Type: pressure   Duration: intermittant    Comments: Patient here for follow up/medications  Patient states feels safe in life    Medications and Allergies Reviewed    Signed: Dawn M. Koleen Nimrod [place].Marland KitchenMarland KitchenMarland KitchenOctober  7, 2020 8:01 AM    PHQ 2    Over the last 2 weeks, how often have you been bothered by any of the following problems?  1. Little interest or pleasure in doing things:  0   - Not at all  2. Feeling down, depressed, or hopeless:  0   - Not at all    Fever no  SOB no  Headaches no  New loss of smell/taste no  Fatigue no  New cough(not related to chronic condition) no  New nasal congestion or runny nose (not related to seasonal allergies) no  Muscle aches no  Aches and Pains no  Sore throat  no  Diarrhea no  Have you been tested for COVID? no  If so, Where (facility) ?  Date?  Covid Result:    Positive / Negative      Physical Exam    General:      well developed, well nourished, in no acute distress.    Head:      Notrmal.  SCalp with healed posterior scalp wound,excoriation  areas  Eyes:      PERRL/EOM intact, conjunctiva and sclera clear with out nystagmus.    Ears:      TM's intact and clear with normal canals with grossly normal hearing.           Assessment and Plan:     1. SCALP WOUND (-Appears to have been healing well,.No areas of erythema,tenderness or fluctuation.Pt s/p Antibiotic  Advised not to scratch,Use shower cap to cover scalp at night.      F/UP as scheduled  return if new quicktext  concerns    Problems Reviewed  Med Compliance and SE's: Pt is compliant with meds with no side effects   Patient/Caregiver understand instructions and plan.

## 2019-09-02 ENCOUNTER — Ambulatory Visit

## 2019-09-21 ENCOUNTER — Ambulatory Visit

## 2019-09-21 NOTE — Progress Notes (Signed)
 PCP: Angela Cox MD     History of Present Illness:   1 mos f/u: Moderate Cataract OS, . Pt does not drive. Due to anisometropia   h/o OAG Suspect based on discs---- OCT GL done today, HVF 24-2 NOT done yet....   s/p Phaco OD w/ PCIOL in sulcus (2017/2018 ? , Dr. Cruzita Lederer)       Allergies Done  * STRAWBERRIES.    Smoking Status: no        Visual Acuity                      OD                          OS                           Vision:             20/200 Uncorrected          20/200 Uncorrected              Vision#2:           20/30- Phoropter            20/50 Phoropter               Wear Rx #2:         -1.25 -0.75 120 add: +2.50  +1.50 sph add: +2.50            Refinement:         20/30-                      20/50                         Rx:                 -1.25 -0.75 120 add: +2.50  +1.50 sph add: +2.50              Eye Exam  Dilation Time: 3:54 PM -- Patient was cautioned about the risks of dilation.                      OD                          OS                           Dilation:           Myd 1.0% & Phnyl 2.5%       Myd 1.0% & Phnyl 2.5%         Confr fields:       RET                                                       Orbit:              wnl                         wnl  Pupils:             Equal- Round-Reactive to    Equal- Round-Reactive to                          Light                       Light                         EOM:                Orthophoric                 normal                        Conj/scler:         No significant injection    No significant injection      Cornea:             Inferior Corneal Scar       Inferior Corneal Scar                             Without Peripheral          Without Peripheral                                Neovascularization, 2+      Neovascularization, Trace e                       guttata; Salzmann Nodule    guttata: Salzmann Nodule      AC:                 NL                          NL                            Iris:                Patchy surg atrophy nasal , hazel                                               hazel                                                   Lens:               PCIOL in the sulcus,        2++ NS and Cort.                                  slightly superior  Vitreous:           NL                          NL                            Optic Nerve:        NL                          NL                            Cup Size:           0.55                        0.50                          Macula:             NL                          good                          Vessels:            NL                          NL                            Periphery:          NL                          NL                              Technician during exam: [employer].Marland KitchenOctober 27, 2020 3:40 PM      IOP:   Right IOP: 14  -- 3:53 PM - AN  Left IOP: 14 -- 3:53 PM - AN        Special Testing:     Gonioscopy:   Right Eye: III        Left Eye: III          Pachymetry (Central):     Right Eye   Pachymetry: 592  True IOP may be meaningfully lower than indicated by tonometry.  checked twice.     Left Eye   Pachymetry: 560  Tonometric readings probably sufficiently accurate for clinical decision making.        Assessment & Plan:  1) Moderate cataract OS. Pt does not drive.  Current Anisometropia. PLan: PHaco OS , A- Scan   Endocoat.    s/p Phaco OD w/ PCIOL in sulcus (2017/2018 ? , Dr. Cruzita Lederer)   2) [facility] Suspect based on discs.  OCT GL 71/73 thin OU.  and 10/20 HVF 24-2 -pending.   3) RET  ? Low Amblyope.   f/u: 1 month   Route to PMD Angela Cox MD

## 2019-09-21 NOTE — Progress Notes (Signed)
Did you self refer to any specialists?    Age: 73 Years Old  Last Secure Message:    Last PIN change:  03/03/2013         Labs/Vitals Last BP: 120/71 on 09/01/2019     Last eye exam on 08/26/2014.   DueLDL 105 on 05/18/2019 Triglycerides 216 on 05/18/2019  Ultra TSH 1.780 MCLU/ML on 05/18/2019 HGBA1C = HGBA1C 5.3 on 05/18/2019, HGBA1C 5.7 on 02/04/2019        Most Recent Visits     (Last rx/phone contact: 09/02/2019 )        08/03/2019  Mohammad A. Hakim MD (.HPI)    08/17/2019  Osie Bond MD (.HPI)    09/01/2019  Osie Bond MD (.HPI)                                      Next Appointment(s) Today   [facility] - Osie Bond  4:30 PM  11/09/2019  [facility] - Osie Bond  9:00 AM  11/09/2019  [facility] - Osie Bond  9:15 AM      Immunizations       No Adult Tetanus/Adacel documented !   Last Flu  08/03/2019      Mammogram: MGSCRTOMO Date of Exam: 07/29/2019 Date of Exam: .   PAP: Date of Exam: .   Bone Density:  Date of Exam:    Colonoscopy:  Date of Exam:  .  FIT Test:  Date:    Hemoccult:  Date:  .    Protocols Due  HEP C AB, TDAP, PNEUMOVAX, ZOSTAVAX, COLONOSCOPY.           Directives 02/04/2019 ROXANNE COVALUCCI (DAUGHTER [phone]) IS THE HEALTHCARE PROXY ASSIGNED TO THE PATIENT.  SABRINA FUCILE (DAUGHTER [phone]) IS THE ALTERNATE AGENT FOR THE PATIENT.  PAPERWORK IS ON FILE.    The above represents the pre-visit preparations conducted for this patient.    ..................................................................Marland KitchenDawn M. Bradd Canary  September 21, 2019 3:34 PM          Visit Type:  Acute Visit  Primary Provider:  Osie Bond MD      History of Present Illness:  Pt is a pleasant 73 yr old female who presents today for acute visit with following concerns  1.Rash involving perineum started few days ago and now worse.No seeoing,.Is itchy.  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  Pt reports hx of this in  past.Denies any oher rashes,fever/chills,night sweats.Veginal discharge.  She reports genital area trash very bothersome.      Patient Instructions:  1)  THE STAFF WILL BE MAKING SOME APPOINTMENTS FOR YOU:  2)  FOOLLOW UP AS SCHEDULED  3)  T  4)  NEW MEDICATION  5)  LOTRISONE CREAM TO EXTERNAL GENITAL AREA 12 HOURLY  6)  WASH AFTER URINATION  7)  USE COTTON PANTY  8)  USE VASELINE PETROLEUM JELLY FOR  BUTTOCK SKIN IRRITATION  9)  CALL IF SYMPTOMS FAIL TO RESOLVE.  10)  WE DISCUSSED SOME STRATEGIES AND GOALS TO IMPROVE OR MAINTAIN YOUR OVERALL HEALTH:  65)  We have discussed some possible factors that may interfere with your full success with your goals:      Current Directives- Reviewed during today's visit  ROXANNE COVALUCCI (DAUGHTER [phone]) IS THE HEALTHCARE PROXY ASSIGNED TO THE PATIENT.  SABRINA FUCILE (DAUGHTER [phone]) IS  THE ALTERNATE AGENT FOR THE PATIENT.  PAPERWORK IS ON FILE.    Risk Factors  Bladder Control  issues: no  Safety concerns: no  Falls Information:  Past year - yes        Review of Systems   General: Denies fever, chills, sweats, anorexia, fatigue, weakness, malaise, weight loss.   Eyes: Denies visual change or blurring, eye pain.   Ears/Nose/Throat: Denies earache, decreased hearing, difficulty swallowing.   Cardiovascular: Denies chest pain or pressure, palpitations, shortness of breath.   Respiratory: Denies dry cough, productive cough, shortness of breath, wheezing.   Gastrointestinal: Denies acid indigestion, nausea, vomiting, diarrhea, abdominal pain, change in bowel habits, constipation, mucous or blood in stools.   Urologic: Denies dysuria, hematuria, frequency, urgency, flank pain, stress or urge incontinence.   GYN: Genital area rash  Musculoskeletal: Denies muscle cramps or aches, muscle weakness, morning stiffness, joint pain, joint swelling.   Skin: Complains of rash. involving perineum,  sry skin rt buttock  Neurologic: Denies memory loss, parasthesias, dizziness,  headaches, transient weakness.   Psychiatric: Denies anxiety, depression, insomnia.   Endocrine: Denies skin changes, hair loss, weight gain, weight loss, cold intolerance, heat intolerance, polyuria, polydipsia, loss of libido.   Heme/Lymphatic: Denies easy bruising, fatigue, unusual bleeding, fevers, night sweats.     Vital Signs     Patient: 73 Years Old Female  Height:  63.5 in.  Weight: 138 lbs        BMI:  24.15        24.15 on 09/01/2019  BP:  127/73 right arm, normal cuff, seated     120/71 on 09/01/2019   Temp:  97.59  F    temporal  Pulse:  75         Resp:  16  Pulse Ox: 97 %  On Oxygen: No    Patient is not experiencing pain    Comments: patient here for same day sick/?yeast infection  Patient states feels safe in life    Medications and Allergies Reviewed    Signed: Dawn M. Koleen Nimrod Isabella.Marland KitchenMarland KitchenMarland KitchenOctober 27, 2020 3:35 PM    PHQ 2    Over the last 2 weeks, how often have you been bothered by any of the following problems?  1. Little interest or pleasure in doing things:  0   - Not at all  2. Feeling down, depressed, or hopeless:  0   - Not at all    We are asking you to attest for your own safety and the safety of our staff.    Fever no  SOB no  Headaches no  New loss of smell/taste no  Fatigue no  New cough(not related to chronic condition) no  New nasal congestion or runny nose (not related to seasonal allergies) no  Muscle aches no  Aches and Pains no  Sore throat no  Diarrhea no  Have you been tested for COVID?no  If so, Where (facility) ?  Date?  Covid Result:    Positive / Negative      Physical Exam    General:      well developed, well nourished, in no acute distress.    Head:      normocephalic and atraumatic.    Eyes:      PERRL/EOM intact, conjunctiva and sclera clear with out nystagmus.    Ears:      TM's intact and clear with normal canals with grossly normal hearing.    Nose:  no deformity, discharge, inflammation, or lesions.    Mouth:      no deformity or lesions with good dentition.    Neck:       no masses, thyromegaly, or abnormal cervical nodes.    Breasts:      no masses, adenopathy or nipple discharge.   No rash under breasts   Lungs:      clear bilaterally to auscultation.    Heart:      non-displaced PMI, chest non-tender; regular rate and rhythm, S1, S2 without murmurs, rubs, or gallops  Abdomen:       normal bowel sounds; no hepatosplenomegaly no ventral,umbilical hernias or masses noted.      Genitalia:      Perineum area with diffuse ertythematous dermatitis,no diachrge.      Msk:      no deformity or scoliosis noted of thoracic or lumbar spine.    Pulses:      pulses normal in all 4 extremities.    Extremities:      no clubbing, cyanosis, edema, or deformity noted with normal full range of motion of all joints.    Neurologic:      no focal deficits, cranial nerves II-XII grossly intact with normal sensation, reflexes, coordination, muscle strength and tone.    Skin:      Rt posterior buttock/natal cleft with eczematous changes.  Cervical Nodes:      no significant adenopathy.    Inguinal Nodes:      no significant adenopathy.    Psych:      alert and cooperative; normal mood and affect; normal attention span and concentration.           Assessment and Plan:     1. 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    2. ECZEMATOUS DERMATITIS - Inv rt  butock- Advised to try OTC VAseline Petroleum jelly  Call if sx fail to improve.        Problems Reviewed  Med Compliance and SE's: Pt is compliant with meds with no side effects   Patient/Caregiver understand instructions and plan.    Changes to Medication List Documented Today:   LOTRISONE 1-0.05 % EXTERNAL CREAM (CLOTRIMAZOLE-BETAMETHASONE) apply to affected area  TEICE times daily prn rash    Patient Instructions    THE STAFF WILL BE MAKING SOME APPOINTMENTS FOR YOU:    FOOLLOW UP AS SCHEDULED    T    NEW MEDICATION  LOTRISONE CREAM TO EXTERNAL  GENITAL AREA 12 HOURLY  WASH AFTER URINATION  USE COTTON PANTY    USE VASELINE PETROLEUM JELLY FOR  BUTTOCK SKIN IRRITATION  CALL IF SYMPTOMS FAIL TO RESOLVE.    WE DISCUSSED SOME STRATEGIES AND GOALS TO IMPROVE OR MAINTAIN YOUR OVERALL HEALTH:      We have discussed some possible factors that may interfere with your full success with your goals:      Medications:  LOTRISONE 1-0.05 % EXTERNAL CREAM (CLOTRIMAZOLE-BETAMETHASONE) apply to affected area  TEICE times daily prn rash  #45 x 1   Entered and Authorized by: Osie Bond MD   Signed by: Osie Bond MD on 09/21/2019   Method used: Print then Give to Patient   RxID: [id number]

## 2019-09-27 ENCOUNTER — Ambulatory Visit

## 2019-09-27 NOTE — Telephone Encounter (Signed)
 Phone Note -       Call back at [phone]  Initial call taken by: Alfredia Client (Patient Access Center),  September 27, 2019 1:01 PM  Initial Details of Call:  Patient is scheduled for a pre op (within 30 days of surgery) with doctor: Dr. Welton Flakes on 11/23   Patient call back #: [phone]  Date of Surgery: 12/2  Type of Surgery:CATARACT   Where is the surgery :[address]   Name or Surgeon: DR Wonda Olds OFFICE   Surgeons call back #: [phone]

## 2019-10-15 ENCOUNTER — Ambulatory Visit: Admitting: Ophthalmology

## 2019-10-15 NOTE — Progress Notes (Signed)
 Medications:  Added new medication of PRED FORTE 1 % OPHTHALMIC SUSPENSION (PREDNISOLONE ACETATE) One drop four times per day in the left eye starting after surgery; Route: OPHTHALMIC - Signed  Added new medication of OFLOXACIN 0.3 % OPHTHALMIC SOLUTION (OFLOXACIN) one drop four times per day in the left eye starting after the surgery; Route: OPHTHALMIC - Signed  Added new medication of KETOROLAC TROMETHAMINE 0.5 % OPHTHALMIC SOLUTION (KETOROLAC TROMETHAMINE) one drop three times per day in the left eye starting 3 days pprior to surgery; Route: OPHTHALMIC - Signed  Rx of PRED FORTE 1 % OPHTHALMIC SUSPENSION (PREDNISOLONE ACETATE) One drop four times per day in the left eye starting after surgery; Route: OPHTHALMIC  #5[Milliliter] x 3;  Signed;  Entered by: Antonieta Loveless MD;  Authorized by: Antonieta Loveless MD;  Method used: Electronically to [facility] [phone]*, [address], [place], [place]  [address], Ph: [phone] or [phone], Fax: [phone]; Note to Pharmacy: Route: OPHTHALMIC;  Rx of OFLOXACIN 0.3 % OPHTHALMIC SOLUTION (OFLOXACIN) one drop four times per day in the left eye starting after the surgery; Route: OPHTHALMIC  #5[Milliliter] x 3;  Signed;  Entered by: Antonieta Loveless MD;  Authorized by: Antonieta Loveless MD;  Method used: Electronically to [facility] [phone]*, [address] [place], [place]  [address], Ph: [phone] or [phone], Fax: [phone]; Note to Pharmacy: Route: OPHTHALMIC;  Rx of KETOROLAC TROMETHAMINE 0.5 % OPHTHALMIC SOLUTION (KETOROLAC TROMETHAMINE) one drop three times per day in the left eye starting 3 days pprior to surgery; Route: OPHTHALMIC  #5[Milliliter] x 3;  Signed;  Entered by: Antonieta Loveless MD;  Authorized by: Antonieta Loveless MD;  Method used: Electronically to [facility] [phone]*, [address] [place], [place]  [address], Ph: [phone] or [phone], Fax: [phone]; Note to Pharmacy: Route: OPHTHALMIC;

## 2019-10-18 ENCOUNTER — Ambulatory Visit

## 2019-10-18 NOTE — Progress Notes (Signed)
 Did you self refer to any specialists?    Age: 73 Years Old  Last Secure Message:  03/03/2013 - Secure Messaging: Patient Portal Pin Creator  Last PIN change:  03/03/2013         Labs/Vitals Last BP: 127/73 on 09/21/2019     Last eye exam on 08/26/2014.   DueLDL 105 on 05/18/2019 Triglycerides 216 on 05/18/2019  Ultra TSH 1.780 MCLU/ML on 05/18/2019 HGBA1C = HGBA1C 5.3 on 05/18/2019, HGBA1C 5.7 on 02/04/2019        Most Recent Visits     (Last rx/phone contact: 10/15/2019 )        08/17/2019  Osie Bond MD (.HPI)    09/01/2019  Osie Bond MD (.HPI)    09/21/2019  Osie Bond MD (.HPI)                                      Next Appointment(s) 11/09/2019  [facility] - Osie Bond  9:00 AM  11/09/2019  [facility] - Osie Bond  9:15 AM      Immunizations       No Adult Tetanus/Adacel documented !   Last Flu  08/03/2019      Mammogram: MGSCRTOMO Date of Exam: 07/29/2019 Date of Exam: .   PAP: Date of Exam: .   Bone Density:  Date of Exam:    Colonoscopy:  Date of Exam:  .  FIT Test:  Date:    Hemoccult:  Date:  .    Protocols Due  HEP C AB, TDAP, PNEUMOVAX, ZOSTAVAX, COLONOSCOPY.           Directives 02/04/2019 ROXANNE COVALUCCI (DAUGHTER [phone]) IS THE HEALTHCARE PROXY ASSIGNED TO THE PATIENT.  SABRINA FUCILE (DAUGHTER [phone]) IS THE ALTERNATE AGENT FOR THE PATIENT.  PAPERWORK IS ON FILE.    The above represents the pre-visit preparations conducted for this patient.    ..................................................................Marland KitchenDawn M. Bradd Canary  October 18, 2019 8:39 AM          Visit Type:  Consult  Primary Provider:  Osie Bond MD      History of Present Illness:  Delighful 73 yr old female pt here for Preop eval requsted by her Ophthalmologist  for upciooming Left eye cataract surgery.Pt feels well.Denies any complaints.      -HX OF CAROTID ARTERY STENOSIS (- Denies any lightheadeness,weakness or paresthesias.    -. HYPERLIPIDEMIA (- Diet  controlled.  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  6)  Maintain social distancing. 6 feet distance.   7)  Avoid touching your eyes nose and mouth.  8)  Practice good respiratory hygiene  9)  Cover your mouth and nose with your elbow or tissue when you cough or sneeze.  Dispose of the use tissue immediately.  10)  If you have a difficulty breathing call me or other concerns call the office  11)  We have discussed some possible factors that may interfere with your full success with your goals:  Current Directives- Reviewed during today's visit  ROXANNE COVALUCCI (DAUGHTER [phone]) IS THE HEALTHCARE PROXY ASSIGNED TO THE PATIENT.  SABRINA FUCILE (DAUGHTER [phone]) IS THE ALTERNATE AGENT FOR THE PATIENT.  PAPERWORK IS ON FILE.    Risk Factors  Bladder Control  issues: no  Safety concerns: no  Falls Information:  Past year - yes        Review of Systems   General: Denies fever, chills, sweats, anorexia, fatigue, weakness, malaise, weight loss.   Eyes: Denies visual change or blurring, eye pain. Left eye diminished vision  Ears/Nose/Throat: Denies earache, decreased hearing, difficulty swallowing.   Cardiovascular: Denies chest pain or pressure, palpitations, shortness of breath.   Respiratory: Denies dry cough, productive cough, shortness of breath, wheezing.   Gastrointestinal: Denies acid indigestion, nausea, vomiting, diarrhea, abdominal pain, change in bowel habits, constipation, mucous or blood in stools.   Urologic: Denies dysuria, hematuria, frequency, urgency, flank pain, stress or urge incontinence.   GYN: Denies breast pain, breast mass, breast discharge, irregular menses, vaginal  discharge, pelvic pain.   Musculoskeletal: Denies muscle cramps or aches, muscle weakness, morning stiffness, joint pain, joint swelling.   Skin: Denies dry skin, rash, skin ulcers, suspicious lesions.   Neurologic: Denies memory loss, parasthesias, dizziness, headaches, transient weakness.   Psychiatric: Denies anxiety, depression, insomnia.   Endocrine: Denies skin changes, hair loss, weight gain, weight loss, cold intolerance, heat intolerance, polyuria, polydipsia, loss of libido.   Heme/Lymphatic: Denies easy bruising, fatigue, unusual bleeding, fevers, night sweats.     Vital Signs     Patient: 73 Years Old Female  Height:  63.5 in.  Weight: 142 lbs      Wt Chg: 4 since 09/21/2019  BMI:  24.85        24.15 on 09/21/2019  BP:  110/68 left arm, normal cuff, seated     127/73 on 09/21/2019   Temp:  97.59  F    temporal  Pulse:  87         Resp:  16  Pulse Ox: 99 %  On Oxygen: No    Patient is not experiencing pain    Comments: patient here for pre-op visit/ for cataract surgery  Patient states feels safe in life    Medications and Allergies Reviewed    Signed: Dawn M. Koleen Nimrod Tarkio.Marland KitchenMarland KitchenMarland KitchenNovember 23, 2020 8:40 AM    PHQ 2    Over the last 2 weeks, how often have you been bothered by any of the following problems?  1. Little interest or pleasure in doing things:  0   - Not at all  2. Feeling down, depressed, or hopeless:  0   - Not at all    We are asking you to attest for your own safety and the safety of our staff.    Fever no  SOB no  Headaches no  New loss of smell/tasteno  Fatigue no  New cough(not related to chronic condition) no  New nasal congestion or runny nose (not related to seasonal allergies) no  Aches and Pains no  Sore throat no  Diarrhea no  Have you been tested for COVID?yes  If so, Where (facility) ?  Date?  Covid Result:     / Negative      Physical Exam    General:      well developed, well nourished, in no acute distress.    Head:      normocephalic and atraumatic.    Eyes:  Left eye  cataract  Ears:      TM's intact and clear with normal canals with grossly normal hearing.    Nose:      no deformity, discharge, inflammation, or lesions.    Mouth:      no deformity or lesions with good dentition.    Neck:      no masses, thyromegaly, or abnormal cervical nodes.    Chest Wall:      no deformities or breast masses noted.    Lungs:      clear bilaterally to auscultation.    Heart:      non-displaced PMI, chest non-tender; regular rate and rhythm, S1, S2 without murmurs, rubs, or gallops  Abdomen:       normal bowel sounds; no hepatosplenomegaly no ventral,umbilical hernias or masses noted.    Msk:      no deformity or scoliosis noted of thoracic or lumbar spine.    Pulses:      pulses normal in all 4 extremities.    Extremities:      no clubbing, cyanosis, edema, or deformity noted with normal full range of motion of all joints.    Neurologic:      no focal deficits, cranial nerves II-XII grossly intact with normal sensation, reflexes, coordination, muscle strength and tone.    Skin:      intact without lesions or rashes.    Cervical Nodes:      no significant adenopathy.    Axillary Nodes:      no significant adenopathy.    Inguinal Nodes:      no significant adenopathy.    Psych:      alert and cooperative; normal mood and affect; normal attention span and concentration.           Assessment and Plan:     1. PRE-OP EXAM (- Stable exam .  Cleared for surgery    2. CAROTID ARTERY STENOSIS (- Asymptomatic    3. HYPERLIPIDEMIA (- Controlled with diet    F/UP as scheduled  FORM COMPLETED,.Pt to PICK UP ON WEDNESDAY PRIOR TO EYE VISIT.        Problems Reviewed  Med Compliance and SE's: Pt is compliant with meds with no side effects   Patient/Caregiver understand instructions and plan.    Patient Instructions    THE STAFF WILL BE MAKING SOME APPOINTMENTS FOR YOU:    ALREADY SCHEDULED      WE DISCUSSED SOME STRATEGIES AND GOALS TO IMPROVE OR MAINTAIN YOUR OVERALL HEALTH:  CORONA VIRUS ADVICE:    Wash your  hands frequently with an alcohol based hand rub and /or with soap and water for > 20 seconds.    Maintain social distancing. 6 feet distance.     Avoid touching your eyes nose and mouth.    Practice good respiratory hygiene  Cover your mouth and nose with your elbow or tissue when you cough or sneeze.  Dispose of the use tissue immediately.    If you have a difficulty breathing call me or other concerns call the office          We have discussed some possible factors that may interfere with your full success with your goals:

## 2019-10-28 ENCOUNTER — Ambulatory Visit

## 2019-10-28 NOTE — Progress Notes (Signed)
 PCP: Angela Cox MD     History of Present Illness:   1 day s/p Phaco OS (10/27/19): Pt states no pain OS, VA OS is much improved already  on Pred (0/4), Oflox (0/4), Ketor (0/3) - took all this AM  Phaco OD w/ PCIOL in sulcus (2017/2018 ? , Dr. Cruzita Lederer)      Allergies Done  * STRAWBERRIES.    Smoking Status: no        Visual Acuity                      OD                          OS                           Vision:                                         20/30- Uncorrected                Eye Exam                       OD                          OS                           Orbit:              wnl                         wnl                           EOM:                Orthophoric                 normal                        Conj/scler:         No significant injection    No significant injection      Cornea:             Inferior Corneal Scar       Inferior Corneal Scar                             Without Peripheral          Without Peripheral                                Neovascularization, 2+      Neovascularization, Trace e                       guttata; Salzmann Nodule    guttata: Salzmann Nodule      AC:                 NL  Iris:               Patchy surg atrophy nasal , hazel                                               hazel                                                   Lens:               PCIOL in the sulcus,        PCIOL                                             slightly superior                                         Vitreous:           NL                          NL                            Optic Nerve:        NL                          NL                            Cup Size:           0.55                        0.50                          Macula:             NL                          good                          Vessels:            NL                          NL                            Periphery:          NL                           NL  Technician during exam: Seabron Spates..October 28, 2019 10:05 AM      IOP:     Left IOP: 17 -- 10:10 AM - ES            Assessment & Plan:  1) 1 day s/p Phaco OS (10/27/19): Pt states no pain OS, VA OS is much improved already  on Pred (0/4), Oflox (0/4), Ketor (0/3)  AddSysatne C OS three times daily for Corneal surface scarring. . Pt does not drive.  Current Anisometropia.  s/p Phaco OD w/ PCIOL in sulcus (2017/2018 ? , Dr. Cruzita Lederer)   2) [facility] Suspect based on discs.  OCT GL 71/73 thin OU.  and 10/20 HVF 24-2 -pending.   3) RET  ? Low Amblyope.   f/u: 1 wk refract  , trail frame if needed.

## 2019-11-04 ENCOUNTER — Ambulatory Visit: Admitting: Ophthalmology

## 2019-11-04 ENCOUNTER — Ambulatory Visit

## 2019-11-04 NOTE — Progress Notes (Signed)
 PCP: Angela Cox MD     History of Present Illness:   1 week f/u: s/p Phaco OS (10/27/19): Pt states VA OS has been great, no pain/problems OU. taking   Using Pred (0/4), Oflox (0/4), Ketor (0/3) & Sysatne C OS three times daily - took all three this AM with systane after them    s/p Phaco OD w/ PCIOL in sulcus (2017/2018 ? , Dr. Cruzita Lederer)       Allergies Done  * STRAWBERRIES.    Smoking Status: no        Visual Acuity                      OD                          OS                           Vision:             20/70- Uncorrected          20/25+ Uncorrected            Near:               OU-J1                                                       Refraction:         20/25+                      20/20-                        Rx:                 -1.50 -1.00 125 add: +2.50  pl -0.50 55 add: +2.50        Near:               OU-J1+                                                    Comment OU:         trial framed and pt did                                                       mention some distortion of                                                    VA/not dizziness, but didnt  feel right. likes the idea                                                    of being monovision                                         Refinement:         20/25+                      20/20-                        Rx:                 -1.50 -1.00 125 add: +2.50  pl -0.50 55 add: +2.50        Near:               OU-J1+                                                    Comment OU:         .                                                         Refine By:          Antonieta Loveless MD                                              Glasses Rx:  Progressive  Progressive  AR Coating  UV Coating   Eye Exam                       OD                          OS                           Orbit:              wnl                         wnl                           Pupils:             Equal-surg  irreg. -Reactive Equal- Round-Reactive to                          to Light  Light                         EOM:                Orthophoric                 normal                        Conj/scler:         No significant injection    No significant injection      Cornea:             Inferior Corneal Scar       Inferior Corneal Scar                             Without Peripheral          Without Peripheral                                Neovascularization, 2+      Neovascularization, Trace e                       guttata; Salzmann Nodule    guttata: Salzmann Nodule      AC:                 NL                          trace cell                    Iris:               Patchy surg atrophy nasal , hazel                                               hazel                                                   Lens:               PCIOL in the sulcus,        PCIOL                                             slightly superior                                         Vitreous:           NL                          NL  Optic Nerve:        NL                          NL                            Cup Size:           0.55                        0.50                          Macula:             NL                          good                          Vessels:            NL                          NL                            Periphery:          NL                          NL                              Technician during exam: Seabron Spates..November 04, 2019 9:34 AM         IOP:   Right IOP: 16  -- 9:35 AM - ES  Left IOP: 13 -- 9:36 AM - ES            Assessment & Plan:  1)  s/p Phaco OS (10/27/19): Great status and result.   Enjoying Monovision reading OD.  Rx Progressives for other situations.  Plan  Pred (0/2), D/C Oflox, Ketor (0/2)  Taper Pred and Ketor Over 3 wks.   CPM Sysatne C OS three times daily for Corneal surface scarring. . Pt does not drive.  Current Anisometropia.  s/p Phaco  OD w/ PCIOL in sulcus (2017/2018 ? , Dr. Cruzita Lederer)   2) [facility] Suspect based on discs.  OCT GL 71/73 thin OU.  and 10/20 HVF 24-2 -pending.   3) RET  ? Low Amblyope.- Seems unlikely.    f/u 6 months  Route to PMD Dr. Windle Guard.

## 2019-11-09 ENCOUNTER — Ambulatory Visit

## 2019-11-09 NOTE — Progress Notes (Signed)
 Did you self refer to any specialists?    Age: 73 Years Old  Last Secure Message:    Last PIN change:  03/03/2013         Labs/Vitals Last BP: 110/68 on 10/18/2019     Last eye exam on 08/26/2014.   DueLDL 105 on 05/18/2019 Triglycerides 216 on 05/18/2019  Ultra TSH 1.780 MCLU/ML on 05/18/2019 HGBA1C = HGBA1C 5.3 on 05/18/2019, HGBA1C 5.7 on 02/04/2019        Most Recent Visits     (Last rx/phone contact: 11/04/2019 )        09/01/2019  Osie Bond MD (.HPI)    09/21/2019  Osie Bond MD (.HPI)    10/18/2019  Osie Bond MD (.HPI)    10/28/2019  Antonieta Loveless MD (Prob ck)                                      Next Appointment(s) Today   [facility] - Osie Bond  9:15 AM      Immunizations       No Adult Tetanus/Adacel documented !   Last Flu  08/03/2019      Mammogram: MGSCRTOMO Date of Exam: 07/29/2019 Date of Exam: .   PAP: Date of Exam: .   Bone Density:  Date of Exam:    Colonoscopy:  Date of Exam:  .  FIT Test:  Date:    Hemoccult:  Date:  .    Protocols Due  HEP C AB, TDAP, PNEUMOVAX, ZOSTAVAX, COLONOSCOPY.           Directives 02/04/2019 ROXANNE COVALUCCI (DAUGHTER [phone]) IS THE HEALTHCARE PROXY ASSIGNED TO THE PATIENT.  SABRINA FUCILE (DAUGHTER [phone]) IS THE ALTERNATE AGENT FOR THE PATIENT.  PAPERWORK IS ON FILE.    The above represents the pre-visit preparations conducted for this patient.    ..................................................................Marland KitchenDawn M. Bradd Canary  November 09, 2019 8:39 AM          Visit Type:  Annual Physical  Primary Provider:  Osie Bond MD      History of Present Illness:      Pleasant 73 yr old female here for Medicare wellness.Overall doing well .S/p cataract surgery.EXcited about great grandson  -delivery next month.Husband smokes in other room.      -CAROTID ARTERY STENOSIS (- Pt denies any dizziness or vision changes.No weakness/paresthesias.    -. HYPERGLYCEMIA (- No sx of POlyuris/polydipsia.    -. HYPERLIPIDEMIA - Lipids were  optomal last lab    -. HYPOTHYROID (- Taking LT4.Recent TSH normal.    -. ARTHRITIS - Not effecting ambulation.CAres for 64 yr old husband.      Patient Instructions:  1)  THE STAFF WILL BE MAKING SOME APPOINTMENTS FOR YOU:  2)  FOLLOW UP 5 MONTHS  3)  Previsit labs- 1 week before next visit  4)  CORONA VIRUS ADVICE:  5)  Wash your hands frequently with an alcohol based hand rub and /or with soap and water for > 20 seconds.  6)  Maintain social distancing. 6 feet distance.   7)  Avoid touching your eyes nose and mouth.  8)  Practice good respiratory hygiene  9)  Cover your mouth and nose with your elbow or tissue when you cough or sneeze.  Dispose of the use tissue immediately.  10)  If you have a difficulty breathing call me or other concerns call  the office  11)  WE DISCUSSED SOME STRATEGIES AND GOALS TO IMPROVE OR MAINTAIN YOUR OVERALL HEALTH:  12)  We have discussed some possible factors that may interfere with your full success with your goals:      Current Directives- Reviewed during today's visit  ROXANNE COVALUCCI (DAUGHTER [phone]) IS THE HEALTHCARE PROXY ASSIGNED TO THE PATIENT.  SABRINA FUCILE (DAUGHTER [phone]) IS THE ALTERNATE AGENT FOR THE PATIENT.  PAPERWORK IS ON FILE.    Risk Factors  Bladder Control  issues: no  Safety concerns: no  Falls Information:  Past 6 months - Yes  Past year - No        Review of Systems   General: Denies fever, chills, sweats, anorexia, fatigue, weakness, malaise, weight loss.   Eyes: Denies visual change or blurring, eye pain.   Ears/Nose/Throat: Denies earache, decreased hearing, difficulty swallowing.   Cardiovascular: Denies chest pain or pressure, palpitations, shortness of breath.   Respiratory: Denies dry cough, productive cough, shortness of breath, wheezing.   Gastrointestinal: Denies acid indigestion, nausea, vomiting, diarrhea, abdominal pain, change in bowel habits, constipation, mucous or blood in stools.   Urologic: Denies dysuria, hematuria,  frequency, urgency, flank pain, stress or urge incontinence.   GYN: Denies breast pain, breast mass, breast discharge, irregular menses, vaginal discharge, pelvic pain.   Musculoskeletal: Denies muscle cramps or aches, muscle weakness, morning stiffness, joint pain, joint swelling.   Skin: Denies dry skin, rash, skin ulcers, suspicious lesions.   Neurologic: Denies memory loss, parasthesias, dizziness, headaches, transient weakness.   Psychiatric: Denies anxiety, depression, insomnia.   Endocrine: Denies skin changes, hair loss, weight gain, weight loss, cold intolerance, heat intolerance, polyuria, polydipsia, loss of libido.   Heme/Lymphatic: Denies easy bruising, fatigue, unusual bleeding, fevers, night sweats.     Vital Signs     Patient: 73 Years Old Female  Height:  63.5 in.  Weight: 142 lbs        BMI:  24.85        24.85 on 10/18/2019  BP:  108/68 right arm, normal cuff, seated     110/68 on 10/18/2019   Temp:  96.3  F    temporal  Pulse:  74         Resp:  16  Pulse Ox: 99 %  On Oxygen: No    Patient is not experiencing pain    Comments: Patient here for a Medicare annual wellness  Patient states feels safe in life    Medications and Allergies Reviewed    Signed: Dawn M. Koleen Nimrod [place].Marland KitchenMarland KitchenMarland KitchenDecember 15, 2020 8:41 AM    PHQ 2    Over the last 2 weeks, how often have you been bothered by any of the following problems?  1. Little interest or pleasure in doing things:  0   - Not at all  2. Feeling down, depressed, or hopeless:  0   - Not at all    Mini-HRA   1. Has your physical and emotional health limited your social activities with family, friends, neighbors, or groups?  1  - Never    We are asking you to attest for your own safety and the safety of our staff.    Fever no  SOB no  Headaches no  New loss of smell/taste no  Fatigue no  New cough(not related to chronic condition) no  New nasal congestion or runny nose (not related to seasonal allergies) no  Muscle aches no  Aches and Pains no  Sore throat  no  Diarrhea  no  Have you been tested for COVID?yes  If so, Where (facility) ?  Date?  Covid Result:     / Negative  Has anyone you've been in contact with or in your household tested positive for covid? If YES When?      Physical Exam    General:      well developed, well nourished, in no acute distress.    Head:      normocephalic and atraumatic.    Eyes:      PERRL/EOM intact, conjunctiva and sclera clear with out nystagmus.    Ears:      TM's intact and clear with normal canals with grossly normal hearing.    Nose:      no deformity, discharge, inflammation, or lesions.    Mouth:      no deformity or lesions with good dentition.    Neck:      no masses, thyromegaly, or abnormal cervical nodes.    Chest Wall:      no deformities or breast masses noted.    Lungs:      clear bilaterally to auscultation.    Heart:      non-displaced PMI, chest non-tender; regular rate and rhythm, S1, S2 without murmurs, rubs, or gallops  Abdomen:       normal bowel sounds; no hepatosplenomegaly no ventral,umbilical hernias or masses noted.    Msk:      no deformity or scoliosis noted of thoracic or lumbar spine.    Pulses:      pulses normal in all 4 extremities.    Extremities:      no clubbing, cyanosis, edema, or deformity noted with normal full range of motion of all joints.    Neurologic:      no focal deficits, cranial nerves II-XII grossly intact with normal sensation, reflexes, coordination, muscle strength and tone.    Skin:      intact without lesions or rashes.    Cervical Nodes:      no significant adenopathy.    Axillary Nodes:      no significant adenopathy.    Inguinal Nodes:      no significant adenopathy.    Psych:      alert and cooperative; normal mood and affect; normal attention span and concentration.           Assessment and Plan:     1. VISIT FOR PREVENTIVE HEALTH EXAMINATION (- Stable exam for age.Advised to avoid second hand smoke.      -CAROTID ARTERY STENOSIS - No bruit on exam    -. HYPERGLYCEMIA - REsolved    -.  HYPERLIPIDEMIA - Optomal on diet    -. HYPOTHYROID (- Clinically Euthyroi on current LT4 DOse    -. ARTHRITIS- Stable  -LOW BP reads-Pt asymptomatic     Advised to liberalise salt but in small quantity like use of Ritz cracker ,take 1-2 twice daily ti improve BP.  F/UP 5months  Previsit labs  Call/return sooner if concerns./        Problems Reviewed  Med Compliance and SE's: Pt is compliant with meds with no side effects   Patient/Caregiver understand instructions and plan.    Patient Instructions    THE STAFF WILL BE MAKING SOME APPOINTMENTS FOR YOU:    FOLLOW UP 5 MONTHS  Previsit labs- 1 week before next visit    CORONA VIRUS ADVICE:    Wash your hands frequently with an alcohol based  hand rub and /or with soap and water for > 20 seconds.    Maintain social distancing. 6 feet distance.     Avoid touching your eyes nose and mouth.    Practice good respiratory hygiene  Cover your mouth and nose with your elbow or tissue when you cough or sneeze.  Dispose of the use tissue immediately.    If you have a difficulty breathing call me or other concerns call the office        WE DISCUSSED SOME STRATEGIES AND GOALS TO IMPROVE OR MAINTAIN YOUR OVERALL HEALTH:      We have discussed some possible factors that may interfere with your full success with your goals:              Medicare Wellness Visit    Vital Signs   Weight: 142 lb.     Height: 63.5 in.   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.9)  SCALP WOUND (ICD-873.0) (ICD10-S01.00xA)  LOW BLOOD PRESSURE, NOT HYPOTENSION (ICD-796.3) (KGU54-Y70.1)  CAROTID ARTERY STENOSIS (ICD-433.10)  (ICD10-I65.29)  SYNCOPE (ICD-780.2) (ICD10-R55)  SCREENING, COLON CANCER (ICD-V76.51) (ICD10-Z12.11)  SCREENING EXAM FOR BREAST CANCER (ICD-V76.10) (ICD10-Z12.39)  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)  HYPERGLYCEMIA (ICD-790.29) (ICD10-R73.9)  HYPERLIPIDEMIA (ICD-272.4) (ICD10-E78.5)  HYPOTHYROID (ICD-244.9) (ICD10-E03.9)  CATARACTS (ICD-366.9) (ICD10-H26.9)  ARTHRITIS (ICD-716.90) (ICD10-M19.90)    Current Medications- Reviewed during today's visit  LEVOTHYROXINE SODIUM 50 MCG ORAL TABLET (LEVOTHYROXINE SODIUM) 1 by mouth daily; Route: ORAL  PRED FORTE 1 % OPHTHALMIC SUSPENSION (PREDNISOLONE ACETATE) One drop four times per day in the left eye starting after surgery; Route: OPHTHALMIC  OFLOXACIN 0.3 % OPHTHALMIC SOLUTION (OFLOXACIN) one drop four times per day in the left eye starting after the surgery; Route: OPHTHALMIC  KETOROLAC TROMETHAMINE 0.5 % OPHTHALMIC SOLUTION (KETOROLAC TROMETHAMINE) one drop three times per day in the left eye starting 3 days pprior to surgery; Route: OPHTHALMIC    Current Allergies- Reviewed during today's visit  * STRAWBERRIES (Critical)    Current Directives- Reviewed during today's visit  ROXANNE COVALUCCI (DAUGHTER [phone]) IS THE HEALTHCARE PROXY ASSIGNED TO THE PATIENT.  SABRINA FUCILE (DAUGHTER [phone]) IS THE ALTERNATE AGENT FOR THE PATIENT.  PAPERWORK IS ON FILE.    Past Medical History  cataracts  thyroid disease    Surgical History  cataract surgery  bladder reconstruction  tubal ligation  appendecomy    Social History  Marital Status:  widowed       Children: 4    Lives With:son/first husband= John Creeden      Occupation:  at home/retired        Family History  mom-cataracts passed-heart/kidney complications  dad-stroke  sister -stroke passed  brother-agent orange passed      Cognitive Screen: 6 Item Cognitive Screener   1. What year is this? Correct  2. What month is this? Correct  3. What is the day of the week? Correct    What were the three  objects I asked you to remember?   4. Apple? Correct  5. Table? Correct  6. Boyd Kerbs? Correct  Level of Function   Hearing Impairment  Trouble hearing the television or radio when others do not - No  Strain or struggle to hear/understand conversations - No    Depression  Over the last 2 weeks, how often have you been bothered by any of the following problems?  1. Little interest or pleasure in doing things:  0   - Not at all  2. Feeling down, depressed, or hopeless:  0   - Not at all    Myrtis Ser Index of Independence in ADL's  Bathing: Independence  Dressing: Independence  Toileting: Independence  Transferring: Independence  Continence: Independence  Feeding: Independence    ADL Score: 6 (The patient is independent.)    Home Safety  Adequate housing - Yes  Transportation - Yes  Sufficient food - Yes  Telephone access - Yes  Actual/threats of physical/emotional abuse - No  Actual/threats of sexual abuse - No  Feel safe at home - Yes  Home Safety Assessment Needed :  No    Balance:  Difficulties with balance - No  Experienced lightheadedness/dizziness w/ position changes - Yes  Episodes of dizziness - No  Experienced blackouts - No  Health Risk Assessment     In the past four weeks?   1. Has your physical and emotional health limited your social activates with family, friends, neighbors or groups?  1  - Never  2. Have you needed help preparing your own meals?  1  - Never  3. Have you needed help with transportation?  (for example, can you travel alone on buses or drive your own car?)  1  - Never  4. Are you having difficulties driving your car?  NA - Not Applicable  5. Have you needed help with shopping for groceries or clothes?  1  - Never  6. Have you needed help with taking your medications?  1  - Never  7. Have you needed help with managing your finances?  1  - Never  8. Have you needed help with household chores?:  1  - Never  9. Was someone available to assist you if needed or wanted help?  (for example, help with  daily chores; if you felt lonely; or got sick)  3  - Sometimes  10. Do you have any concerns about your memory?  1  - Never  11. Do any of your friends/family have any concerns about your memory?  1  - Never  12. Have you had any sexual problems?  NA - Not Applicable  13. Do you eat healthy foods daily?  5  - Always  14. Have teeth or denture problems?  NA - Not Applicable  15. Have problems using a telephone?  1  - Never  16. Have tiredness or fatigue?  3  - Sometimes  17. How much bodily pain have you generally had?  3  - Sometimes    Do you live alone? No  Does your home have: Throw rugs? No  Does your home have: Poor lighting? No  Does your home have: A slippery tub? No  Does your home have: Grab bars in bathroom? Yes  Does your home have: Handrails on stairs or steps? Yes  Does your home have: Functioning smoke alarms? Yes  Does your home have: Functioning carbon monoxide alarms? Yes    During the past four weeks, how would you rate your health in general? Excellent  During the past four weeks, how have things been going for  you? Very Well  How confident are you that you can control and manage most of your health problems? Very Confident  Do you have any health concerns you would like to discuss today?  no    Education and Counseling for patient due within the next 10 years  The following Services are recommended for screening and disease prevention.

## 2020-02-22 ENCOUNTER — Ambulatory Visit

## 2020-02-22 NOTE — Telephone Encounter (Signed)
 Phone Note -     Patient    Routine    Call back at [facility] [phone]  Initial call taken by: Helaine Chess (Patient Access Center),  February 22, 2020 8:44 AM  Actual Caller: Patient  Call For: Nurse  Initial Details of Call:  Patient is requesting to speak with the nurse regarding her covid vaccine patient stated she is not sure if the nurse made the appointment for her to the vaccine in [place] or does she need to schedule the appointment herself, please reach out to the patient     Best call back #:[phone]        Follow-up #1  Details: spoke with the patient let her know that her vaccine appts  are all set  for her and her son for the [facility] for thursday april 1 for 12:00 & 12:30 for shot #1 and thursday april 22 for 12:15 & 12:30 for Shot #2.  a copy of the dates were sent to the patients cell phone.   By: Miachel Roux. Koleen Nimrod [facility] ~ February 22, 2020 9:08 AM

## 2020-02-24 ENCOUNTER — Ambulatory Visit

## 2020-02-25 ENCOUNTER — Ambulatory Visit

## 2020-02-25 NOTE — Progress Notes (Signed)
 [facility] - Osie Bond, MD   [address]  [place], [place] [address]  Phone: [phone]  Fax: [phone]       February 28, 2020      [address]  [address] [place], [place] [address]        RE:  TEST RESULTS    Dear Ms. Vinciguerra:    I have carefully reviewed your most recent test results, and below is a summary of my findings.  Please take note of any instructions provided.      I am pleased to tell you that your recent mammogram was normal.  I recommend you have another mammogram in one to two years.   Please call our office if you have any questions.    Sincerely,    Osie Bond, MD

## 2020-04-11 ENCOUNTER — Ambulatory Visit

## 2020-04-11 LAB — HX COMPREHENSIVE METABOLIC PANEL
HX ALBUMIN: 3.9 g/dL (ref 3.2–5.0)
HX ALKALINE PHOSPHATASE: 109 U/L (ref 30.0–117.0)
HX ALT: 20 U/L (ref 6.0–55.0)
HX ANION GAP: 4 (ref 3.0–11.0)
HX AST: 9 U/L (ref 6.0–40.0)
HX BICARBONATE: 29 mmol/L (ref 21.0–32.0)
HX BUN: 14 mg/dL (ref 8.0–23.0)
HX CALCIUM: 8.9 mg/dL (ref 8.5–10.5)
HX CHLORIDE: 110 mmol/L (ref 98.0–110.0)
HX CREATININE: 0.77 mg/dL (ref 0.55–1.3)
HX GLOMERULAR FR AFRICAN AMERICAN: 89
HX GLOMERULAR FR NON AFRICAN AMER: 78
HX GLUCOSE: 91 mg/dL (ref 70.0–110.0)
HX POTASSIUM: 4.2 mmol/L (ref 3.6–5.2)
HX SODIUM: 143 mmol/L (ref 136.0–146.0)
HX TOTAL BILIRUBIN: 0.2 mg/dL (ref 0.2–1.2)
HX TOTAL PROTEIN: 7.5 g/dL (ref 6.0–8.4)

## 2020-04-11 LAB — HX GLYCOHEMOGLOBIN
HX ESTIMATED AVERAGE GLUCOSE: 114 mg/dL
HX HEMOGLOBIN A1C: 5.6 % (ref <=5.6)

## 2020-04-11 LAB — HX LIPID PANEL FASTING
HX CHOLESTEROL (LIPR): 190 mg/dL (ref <=200)
HX HDL CHOLESTEROL: 46 mg/dL (ref >=40)
HX LDL CHOLESTEROL: 91 mg/dL (ref <=130)
HX TRIGLYCERIDES: 265 mg/dL — ABNORMAL HIGH (ref <=150)

## 2020-04-11 LAB — HX TSH WITH REFLEX: HX TSH WITH REFLEX: 24.8 u[IU]/mL — ABNORMAL HIGH (ref 0.358–3.74)

## 2020-04-11 LAB — HX FREE T4: HX FREE T4: 0.79 ng/dL (ref 0.7–1.7)

## 2020-04-11 NOTE — Progress Notes (Signed)
 Mammogram: [id number] Date of Exam: 02/25/2020.   PAP: Date of Exam: .   Bone Density:  Date of Exam:    Colonoscopy:  Date of Exam:  .  FIT Test:  Date:    Hemoccult:  Date:  .    Protocols Due  hep c ab  tdap  pneumovax  zoster  colonoscopy  Covid vac     The above represents the pre-visit preparations conducted for this patient.      Visit Type:  Follow-up Visit  Primary Provider:  Osie Bond MD      History of Present Illness:  Carolyn 74 yr old female Young here for follow up. Excited about upcoming trip to [facility] to see relatives.   She was doing well up until 3days ago when she tried to  move  furniture to change setting.  Her son was helping but Young says she  has felt low back tightness.    -1.LOW BACK PAIN -  After moving furniture.NO trauma or fall.  Denies any radiation or radicular sx.  Reports sx felt mostly with change in positions.    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    3. HYPERGLYCEMIA - Young denies any polyuria/polydipsia.    4. HYPERLIPIDEMIA -  Diet controlled.    5. HYPOTHYROID- Young reports compliance with  LT4.  Medication(s): LEVOTHYROXINE SODIUM 50 MCG ORAL TABLET: 1 by mouth daily            Current Directives- Reviewed during today's visit  Carolyn Young (DAUGHTER [phone]) IS THE HEALTHCARE PROXY ASSIGNED TO THE PATIENT.  Carolyn Young (DAUGHTER [phone]) IS THE ALTERNATE AGENT FOR THE PATIENT.  PAPERWORK IS ON FILE.    Risk Factors  Bladder Control  issues: no  Safety concerns: no  Falls Information:  Past year - yes        Review of Systems   General: Denies fever, chills, sweats, anorexia, fatigue, weakness, malaise, weight loss.   Eyes: Denies visual change or blurring, eye pain.   Ears/Nose/Throat: Denies earache, decreased hearing, difficulty swallowing.   Cardiovascular: Denies chest pain or pressure, palpitations, shortness of breath.   Respiratory: Denies dry cough, productive cough, shortness of breath, wheezing.   Gastrointestinal: Denies  acid indigestion, nausea, vomiting, diarrhea, abdominal pain, change in bowel habits, constipation, mucous or blood in stools.   Urologic: Denies dysuria, hematuria, frequency, urgency, flank pain, stress or urge incontinence.   Musculoskeletal: Complains of back pain.   Skin: Denies dry skin, rash, skin ulcers, suspicious lesions.   Neurologic: Denies memory loss, parasthesias, dizziness, headaches, transient weakness.   Psychiatric: Denies anxiety, depression, insomnia.   Endocrine: Denies skin changes, hair loss, weight gain, weight loss, cold intolerance, heat intolerance, polyuria, polydipsia, loss of libido.     Vital Signs     Patient: 74 Years Old Female  Height:  63.5 in.  Weight: 148 lbs      Wt Chg: 6 since 11/09/2019  BMI:  25.90        24.85 on 11/09/2019  BP:  130/78 right arm, normal cuff, seated     108/68 on 11/09/2019   Temp:  97.3  F    temporal  Pulse:  71         Resp:  16  Pulse Ox: 100 %  On Oxygen: No    Patient is not experiencing pain  Are you having trouble paying for your medications?  No    Comments: Patient here for follow up  Patient states feels safe in life    Medications and Allergies Reviewed    Signed: Miachel Roux. Koleen Nimrod Lodi....Apr 11, 2020 8:03 AM    PHQ 2    Over the last 2 weeks, how often have you been bothered by any of the following problems?  1. Little interest or pleasure in doing things:  0   - Not at all  2. Feeling down, depressed, or hopeless:  0   - Not at all    We are asking you to attest for your own safety and the safety of our staff.    Fever no  SOB no  Headaches no  New loss of smell/taste no  Fatigue no  New cough(not related to chronic condition) no  New nasal congestion or runny nose (not related to seasonal allergies) no  Muscle aches no  Aches and Pains no  Sore throat no  Diarrhea no  Have you been tested for COVID?  If so, Where (facility) ?  Date?  Covid Result:    Positive / Negative  Has anyone you?ve been in contact with or in your household tested  positive for covid? If YES When?        Immunization History:  Historical Source: Historical information - from public agency   COVID-19: COVID-19 Pfizer 30 mcg  #1 Historical (02/24/2020)   COVID-19: COVID-19 Pfizer 30 mcg  #2 Historical (03/15/2020)      Physical Exam    General:      well developed, well nourished, in no acute distress.    Head:      normocephalic and atraumatic.    Eyes:      PERRL/EOM intact, conjunctiva and sclera clear with out nystagmus.    Neck:      no masses, thyromegaly, or abnormal cervical nodes.    Lungs:      clear bilaterally to auscultation.    Heart:      non-displaced PMI, chest non-tender; regular rate and rhythm, S1, S2 without murmurs, rubs, or gallops  Abdomen:       normal bowel sounds; no hepatosplenomegaly no ventral,umbilical hernias or masses noted.    Msk:       Mild Paralumbar muscle tenderness.  Normal ROM  Negative straight leg raise.  Pulses:      pulses normal in all 4 extremities.    Extremities:      no clubbing, cyanosis, edema, or deformity noted with normal full range of motion of all joints.    Neurologic:      no focal deficits, cranial nerves II-XII grossly intact with normal sensation, reflexes, coordination, muscle strength and tone.    Skin:      intact without lesions or rashes.    Psych:      alert and cooperative; normal mood and affect; normal attention span and concentration.           Assessment and Plan:     1.LOW BACK PAIN - Muscle strain/spasm from moving heavy furniture  Advised trial of heat,Tylenol  Lidocaine patch topical.   Avoid activities that aggravate sx.  Call if sx fal to resolve.  2.- CAROTID ARTERY STENOSIS- Completely asymptomaTIc  Monitor.    3=. HYPERGLYCEMIA (- Check CMP/A1C    4. HYPERLIPIDEMIA (- Diet controlled  Fasting lipids    5. HYPOTHYROID - TSH Ordered   On  LEVOTHYROXINE SODIUM 50 MCG ORAL TABLET: 1 by mouth daily      F/UP 90mo  Call/return sooner if  any concerns.        Problems Reviewed  Med Compliance and SE's: Young  is compliant with meds with no side effects   Patient/Caregiver understand instructions and plan.    Changes to Medication List Documented Today:   LIDOPATCH PAIN RELIEF 3.6-1.25 % EXTERNAL PATCH (LIDOCAINE-MENTHOL) Apply to low back area once every 3 days; Route: EXTERNAL    Patient Instructions     FOLLOW UP 4 MONTHS    LABS TODAY    AVOID HEAVY LIFTING/BENDING  TO AVOID FURTHER LOWER BACK AGGRAVATION  USE PAIN PATCH AS PRESCRIBED  USE TYLENOL  AS NEEDED    Medications:  LIDOPATCH PAIN RELIEF 3.6-1.25 % EXTERNAL PATCH (LIDOCAINE-MENTHOL) Apply to low back area once every 3 days  #3 x 1   Route:EXTERNAL   Entered and Authorized by: Osie Bond MD   Signed by: Osie Bond MD on 04/11/2020   Method used: Print then Give to Patient   Note to Pharmacy: Route: EXTERNAL;SAVINGS FOR NON-COVERED MEDICATIONS-For claims: [id number] [id number] Group:[id number] [id number];            Questions: MedImpact [id number]    RxID: [id number]

## 2020-04-12 ENCOUNTER — Ambulatory Visit

## 2020-04-12 NOTE — Progress Notes (Signed)
 [facility] - Osie Bond, MD   [address]  [place], [place] [address]  Phone: [phone]  Fax: [phone]       Apr 12, 2020      Carolyn Young  [address]    [place], [place] [address]        RE:  TEST RESULTS    Dear Ms. Coutts:    I have carefully reviewed your most recent test results, and below is a summary of my findings.  Please take note of any instructions provided.    Electrolyte Studies:  Normal.    Kidney Function Studies:  Normal.    Liver Function Studies:  Normal.    Cholesterol Studies:  Fair.  Comments: Your triglycerides are elevated.I have enclosed information brochure.  Cholesterol:  190    Goal:  < =200 mg/dL  HDL:  46    Goal:  > =01 mg/dL  LDL:  91    Goal:  < =601 mg/dL  [id number] Goal:  < =150 mg/dL    Blood Glucose Studies:  Normal.  Blood Glucose:  91    Goal:  70-110    Diabetic Studies:  Normal.  HGBA1C:  5.6    Goal:  <=5.6    Thyroid Studies:  Abnormal.  Comments: Your thyroid test  showed need for slight increase in dose of Levothyroxine.  I recommend taking Extra half tablet on saturday and sunday.  MON-FRI- 1 TAB DAILY  SAT AND SUNDAY  TAKE 1 AND HALF TAB=75 MCG.  We will recheck thyroid test in 3months.  TSH W/Reflex:  24.800 MCLU/ML    Goal:  0.358-3.740  T4 Free:  0.79 NGM/DL    Goal:  2.70-6.23      Please take enclosed prescription to pharmacy to be filled.    Please take enclosed lab slip to the lab for testing.    I recommend repeating thyroid  test  in 2 months.    Hope you're feeling better.  Please call our office if you have any questions.    Sincerely,    Osie Bond, MD

## 2020-06-13 ENCOUNTER — Ambulatory Visit

## 2020-06-13 NOTE — Progress Notes (Signed)
 PCP: Osie Bond MD     History of Present Illness:   6 month f/u: Pt ststaes VA OU has been great, no issues, seeing very well. Pt states OU have been itchy, pt bought visine for itchiness but they don't work all the time.  s/p Phaco OS (10/27/19): Enjoying Monovision reading OD  s/p Phaco OD w/ PCIOL in sulcus (2017/2018 ? , Dr. Cruzita Lederer)      Allergies Done  * STRAWBERRIES.    Smoking Status: no        Visual Acuity                      OD                          OS                           Vision:             20/70-2 Uncorrected         20/25- Uncorrected            Near:               OU-J1                                                     Intermed:           .                                                             Eye Exam  Dilation Time: 3:49 PM -- Patient was cautioned about the risks of dilation.                      OD                          OS                           Dilation:           Myd 1.0% & Phnyl 2.5%       Myd 1.0% & Phnyl 2.5%         Orbit:              wnl                         wnl                           Pupils:             surg irreg. -Reactive to    Equal- Round-Reactive to                          Light  Light                         EOM:                Orthophoric                 normal                        Conj/scler:         No significant injection    No significant injection      Cornea:             Inferior Corneal Scar       Inferior Corneal Scar                             Without Peripheral          Without Peripheral                                Neovascularization, 2+      Neovascularization, Trace e                       guttata; Salzmann Nodule    guttata: Salzmann Nodule      AC:                 NL                          trace cell                    Iris:               Patchy surg atrophy nasal , hazel                                               hazel                                                   Lens:               PCIOL  in the sulcus,        PCIOL, in the bag, clear                          slightly superior           cap                           Vitreous:           mobile vitreous             mobile vitreous               Optic Nerve:        NL  NL                            Cup Size:           0.55                        0.50                          Macula:             good                        good                          Vessels:            NL                          NL                            Periphery:          NL                          NL                              Technician during exam: Seabron Spates..June 13, 2020 3:43 PM      IOP:   Right IOP: 15  -- 3:49 PM - ES  Left IOP: 14 -- 3:49 PM - ES            Assessment & Plan:  1)  s/p Phaco OS (10/27/19): Great status and result.   Enjoying Monovision reading OD.  Rx Progressives for other situations. Pt does not drive.  Current Anisometropia.  1.5) s/p Phaco OD w/ PCIOL in sulcus (2017/2018 ? , Dr. Cruzita Lederer)   2) High [facility] Suspect based on discs & OCT GL 71/73 thin OU. **schedule HVF 24-2***   3) RET ? Low Amblyope.- Seems unlikely.    f/u 4 months, **schedule HVF 24-2**  Route to PMD Dr. Windle Guard.

## 2020-06-26 ENCOUNTER — Ambulatory Visit

## 2020-08-03 ENCOUNTER — Ambulatory Visit

## 2020-08-03 NOTE — Progress Notes (Signed)
 [facility] - Osie Bond, MD   [address]  [place], [place] [address]  Phone: [phone]  Fax: [phone]       August 04, 2020      Carolyn Young  [address] [place], [place] [address]        RE:  TEST RESULTS    Dear [name]:    I have carefully reviewed your most recent test results, and below is a summary of my findings.  Please take note of any instructions provided.      I am pleased to tell you that your recent mammogram was normal.  I recommend you have another mammogram in one to two years.   Please call our office if you have any questions.    Sincerely,    Osie Bond, MD

## 2020-08-04 ENCOUNTER — Ambulatory Visit: Admitting: Ophthalmology

## 2020-08-04 NOTE — Progress Notes (Signed)
 PCP: Osie Bond MD     History of Present Illness:   early f/u after testing: OAG suspect: Pt denies any new visual/comfort changes Ou since the last exam.   s/p Phaco OS (10/27/19)  s/p Phaco OD w/ PCIOL in sulcus (2017/2018 ? , Dr. Cruzita Lederer)      Allergies Done  * STRAWBERRIES.    Smoking Status: no        Visual Acuity                      OD                          OS                           Vision:             20/70-2 Uncorrected         20/25- Uncorrected            Near:               .                                                         Intermed:           OU-J1                                                         Eye Exam   Primary Gaze: Int R ET.                       OD                          OS                           Orbit:              wnl                         wnl                           Pupils:             surg irreg. -Reactive to    Equal- Round-Reactive to                          Light                       Light                         EOM:                Orthophoric  normal                        Conj/scler:         No significant injection    No significant injection      Cornea:             Inferior Corneal Scar       Inferior Corneal Scar                             Without Peripheral          Without Peripheral                                Neovascularization, 2+      Neovascularization, Trace e                       guttata; Salzmann Nodule    guttata: Salzmann Nodule      AC:                 NL                          trace cell                    Iris:               Patchy surg atrophy nasal , hazel                                               hazel                                                   Lens:               PCIOL in the sulcus,        PCIOL, in the bag, clear                          slightly superior           cap                           Vitreous:           mobile vitreous             mobile vitreous               Optic Nerve:         NL                          NL                            Cup Size:           0.55  0.50                          Macula:             good                        good                          Vessels:            NL                          NL                            Periphery:          NL                          NL                              Technician during exam: ES      IOP:   Right IOP: 15  -- 10:35 AM   Left IOP: 14 -- 10:35 AM         Special Testing:     Pachymetry (Central):     Right Eye   Pachymetry: 592  checked twice.   9/21: allowed for R ET :598         Assessment & Plan:  1)  s/p Phaco OS (10/27/19): Great status and result.   Enjoying Monovision reading OD.  Rx Progressives for other situations. Pt does not drive.  Current Anisometropia.  1.5) s/p Phaco OD w/ PCIOL in sulcus (2017/2018 ? , Dr. Cruzita Lederer)   2)  [facility].    C-Pahcy 592/560 OCT GL 71/73 thin OU.   (7/21) OD: horizontal raphe nasal defect. OS: S-N defect. Conclusion: OAG correlating VFs and NFL defects. Plan: Latanoprost (hs/hs) - Goal IOP reduction 25%.   3) RET ? Low Amblyope.- Seems Likley.    f/u 3 months  Route to PMD Dr. Windle Guard.         Medications:  LATANOPROST 0.005 % OPHTHALMIC SOLUTION (LATANOPROST) one drop OU before bed  #5[Milliliter] x 12   Route:OPHTHALMIC   Entered and Authorized by: Philemon Kingdom MD   Signed by: Philemon Kingdom MD on 08/04/2020   Method used: Electronically to      [facility] [phone]*  [address]  [place], [place]  [address]  Ph: [phone] or [phone]  Fax: [phone]   Note to Pharmacy: Route: OPHTHALMIC;    RxID: [id number]

## 2020-10-31 ENCOUNTER — Ambulatory Visit

## 2020-10-31 NOTE — Telephone Encounter (Signed)
 Phone Note -     Patient    Routine    Call back at [facility] [phone]  Initial call taken by: Leticia Clas ([facility]),  October 31, 2020 2:04 PM  Actual Caller: Patient  Initial Details of Call:  Tried to Call office.  Patient requesting to schedule labs/ injection/blood work for: Booster vaccine   Date needed by: asap   Call back #: [phone]        Follow-up #1  Details: Spoke with pt, appointment sched for COVID19 booster 12/06/20.  Action: Phone Call Completed  By: Tollie Pizza ~ October 31, 2020 2:44 PM

## 2020-11-10 ENCOUNTER — Ambulatory Visit: Admitting: Ophthalmology

## 2020-11-10 ENCOUNTER — Ambulatory Visit

## 2020-11-10 NOTE — Progress Notes (Signed)
 PCP: Osie Bond MD     History of Present Illness:   3 month f/u, OAG, on Latanoprost (hs/hs): Reports complance with drop therapy. States Latanoprost makes VA blurry at near, but not at distances. She is unaware if it is the drop itself or prescription issue. Also reports mild ocular pain OD. Reports blurriness started ~11/03/20.   s/p Phaco OS (10/27/19) and s/p Phaco OD w/ PCIOL in sulcus (2017/2018) , Dr. Cruzita Lederer)  RET, probable Low Amblyope      Allergies Done  * STRAWBERRIES.    Smoking Status: no        Visual Acuity                      OD                          OS                           Vision:             20/200 PH 20/60-            20/30  Uncorrected                                Uncorrected                                               Near:               .                                                         Intermed:           .                                                           Refraction:         20/40-                                                    Rx:                 -1.00 -1.50 125                                             Refinement:         20/40-                      20/25  Rx:                 -1.00 -1.50 125 add: +2.50  plano -0.50 55 add: +2.50     Refine By:          Philemon Kingdom MD                                            Glasses Rx:  Bifocal  AR Coating  UV Coating    Eye Exam                       OD                          OS                           Orbit:              wnl                         wnl                           Pupils:             surg irreg. -Reactive to    Equal- Round-Reactive to                          Light                       Light                         EOM:                Orthophoric                 normal                        Conj/scler:         No significant injection    No significant injection      Cornea:             Inferior Corneal Scar       Inferior Corneal Scar                              Without Peripheral          Without Peripheral                                Neovascularization, 2+      Neovascularization, Trace e                       guttata; Salzmann Nodule    guttata: Salzmann Nodule      AC:                 NL  trace cell                    Iris:               Patchy surg atrophy nasal , hazel                                               hazel                                                   Lens:               PCIOL in the sulcus,        PCIOL, in the bag, clear                          slightly superior           cap                           Vitreous:           mobile vitreous             mobile vitreous               Optic Nerve:        NL                          NL                            Cup Size:           0.55                        0.50                          Macula:             good                        good                          Vessels:            NL                          NL                            Periphery:          NL                          NL                              Technician during exam: [employer].November 10, 2020 9:58 AM      IOP:   Right IOP: 10wow  -- 10:09 AM - [facility]  Left IOP: 11 -- 10:10 AM - MC            Assessment & Plan:  1)  s/p Phaco OS (10/27/19): Great status and result.   Enjoying Monovision reading OD.  Rx Progressives for other situations. Pt does not drive.  Current Anisometropia.  Bifocal Rx by request.   1.5) s/p Phaco OD w/ PCIOL in sulcus (2017/2018) , Dr. Cruzita Lederer)   2)  [facility].    C-Pahcy 592/560 OCT GL 71/73 thin OU.   (7/21) OD: horizontal raphe nasal defect. OS: S-N defect. Conclusion: OAG correlating VFs and NFL defects. Plan: Latanoprost (hs/hs) - Goal IOP reduction 25%.   Exc IOP 1`2/21  3) RET ? Low Amblyope.- Seems Likley.    f/u 3 months  Route to PMD Dr. Windle Guard.               ]

## 2020-12-05 ENCOUNTER — Ambulatory Visit

## 2020-12-06 ENCOUNTER — Ambulatory Visit

## 2021-02-01 ENCOUNTER — Encounter (HOSPITAL_BASED_OUTPATIENT_CLINIC_OR_DEPARTMENT_OTHER): Payer: Self-pay

## 2021-03-09 ENCOUNTER — Ambulatory Visit: Admit: 2021-03-09 | Discharge: 2021-03-09 | Payer: MEDICARE | Attending: Ophthalmology | Primary: Nurse Practitioner

## 2021-03-09 ENCOUNTER — Other Ambulatory Visit

## 2021-03-09 DIAGNOSIS — H401132 Primary open-angle glaucoma, bilateral, moderate stage: Secondary | ICD-10-CM

## 2021-03-09 MED ORDER — latanoprost (Xalatan) 0.005 % ophthalmic solution
0.005 | Freq: Every evening | OPHTHALMIC | 6 refills | 45.00000 days | Status: AC
Start: 2021-03-09 — End: 2022-03-09

## 2021-03-09 NOTE — Progress Notes (Signed)
 Subjective   Patient ID: ZARIANA STRUB is a 75 y.o. female.        No current outpatient medications on file. (Ophthalmic Drugs)     No current facility-administered medications for this visit. (Ophthalmic Drugs)     No current outpatient medications on file. (Other)     No current facility-administered medications for this visit. (Other)       Objective   Not recorded           Assessment/Plan   1)  s/p Phaco OS (10/27/19): Great status and result.   Enjoying Monovision reading OD.  Rx Progressives for other situations. Pt does not drive.  Current Anisometropia.  Bifocal Rx by request.   1.5) s/p Phaco OD w/ PCIOL in sulcus (2017/2018) , Dr. Cruzita Lederer)   2)  [facility].    C-Pachy  592/560 OCT GL 71/73 thin OU.   (7/21) OD: horizontal raphe nasal defect. OS: S-N defect. Conclusion: OAG correlating VFs and NFL defects. Plan: Latanoprost (hs/hs) - Goal IOP reduction 25%.   Gain start Latan  represcribed   3) RET ? Low Amblyope.- Seems Likley.    f/u 2 months  Route to PMD Dr. Silverio Lay

## 2021-05-11 ENCOUNTER — Other Ambulatory Visit

## 2021-05-11 ENCOUNTER — Encounter (INDEPENDENT_AMBULATORY_CARE_PROVIDER_SITE_OTHER): Admitting: Ophthalmology

## 2021-05-11 ENCOUNTER — Ambulatory Visit: Admit: 2021-05-11 | Discharge: 2021-05-11 | Payer: MEDICARE | Attending: Ophthalmology | Primary: Nurse Practitioner

## 2021-05-11 DIAGNOSIS — H401132 Primary open-angle glaucoma, bilateral, moderate stage: Secondary | ICD-10-CM

## 2021-05-11 NOTE — Progress Notes (Signed)
1)  s/p Phaco OS (10/27/19): Great status and result.   Enjoying Monovision reading OD. Marland Kitchen Pt does not drive.   Current Anisometropia.   Bifocal Rx  6/22  1.5) s/p Phaco OD w/ PCIOL in sulcus (2017/2018) , Dr. Cruzita Lederer) 20/40 best corrcted OD.   2)  OAG.    C-Phacy 592/560  OCT GL 71/73 thin OU.   (7/21) OD: horizontal raphe nasal defect. OS: S-N defect. Conclusion: OAG correlating VFs and NFL defects. Plan: Latanoprost (hs/hs) - Goal IOP reduction 25%.   Exc IOP 1`6/22.  Order updated HVF 24-2 and OCT _GL 7/21.   3) 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    4) Zaditor BID for allergy eye itching.

## 2021-05-29 ENCOUNTER — Encounter: Payer: MEDICARE | Primary: Family Medicine

## 2021-06-04 ENCOUNTER — Encounter: Payer: MEDICARE | Attending: Nurse Practitioner | Primary: Family Medicine

## 2021-07-03 ENCOUNTER — Encounter: Payer: MEDICARE | Attending: Nurse Practitioner | Primary: Family Medicine

## 2021-08-28 ENCOUNTER — Ambulatory Visit: Admit: 2021-08-28 | Payer: MEDICARE | Attending: Nurse Practitioner | Primary: Family Medicine

## 2021-08-28 ENCOUNTER — Ambulatory Visit: Attending: Anatomic Pathology | Admitting: Nurse Practitioner

## 2021-08-28 ENCOUNTER — Other Ambulatory Visit

## 2021-08-28 ENCOUNTER — Encounter (INDEPENDENT_AMBULATORY_CARE_PROVIDER_SITE_OTHER): Admitting: Nurse Practitioner

## 2021-08-28 VITALS — BP 135/70 | HR 78 | Temp 97.1°F | Wt 153.0 lb

## 2021-08-28 DIAGNOSIS — E039 Hypothyroidism, unspecified: Secondary | ICD-10-CM

## 2021-08-28 LAB — COMPREHENSIVE METABOLIC PANEL
ALT: 29 U/L (ref 0–55)
AST: 18 U/L (ref 6–42)
Albumin: 4 g/dL (ref 3.2–5.0)
Alkaline phosphatase: 87 U/L (ref 30–130)
Anion Gap: 4 mmol/L (ref 3–14)
BUN: 18 mg/dL (ref 6–24)
Bilirubin, total: 0.4 mg/dL (ref 0.2–1.2)
CO2 (Bicarbonate): 29 mmol/L (ref 20–32)
Calcium: 10.1 mg/dL (ref 8.5–10.5)
Chloride: 105 mmol/L (ref 98–110)
Creatinine: 0.94 mg/dL (ref 0.55–1.30)
Glucose: 116 mg/dL (ref 70–139)
Potassium: 3.8 mmol/L (ref 3.6–5.2)
Protein, total: 7.8 g/dL (ref 6.0–8.4)
Sodium: 138 mmol/L (ref 135–146)
eGFRcr: 64 mL/min/{1.73_m2} (ref >=60)

## 2021-08-28 LAB — CBC
Hematocrit: 38.6 % (ref 32.0–47.0)
Hemoglobin: 12.6 g/dL (ref 11.0–16.0)
MCH: 29.6 pg (ref 26.0–34.0)
MCHC: 32.6 g/dL (ref 31.0–37.0)
MCV: 90.8 fL (ref 80.0–100.0)
MPV: 10 fL (ref 9.1–12.4)
NRBC %: 0 % (ref 0.0–0.0)
NRBC Absolute: 0 10*3/uL (ref 0.00–2.00)
Platelets: 317 10*3/uL (ref 150–400)
RBC: 4.25 M/uL (ref 3.70–5.20)
RDW-CV: 14 % (ref 11.5–14.5)
RDW-SD: 46.2 fL (ref 35.0–51.0)
WBC: 9.6 10*3/uL (ref 4.0–11.0)

## 2021-08-28 LAB — T4, FREE: T4, free: 0.46 ng/dL — ABNORMAL LOW (ref 0.70–1.70)

## 2021-08-28 LAB — T3, TOTAL: T3, total: 71 ng/dL (ref 60–181)

## 2021-08-28 LAB — TSH WITH REFLEX: TSH: >100 u[IU]/mL — ABNORMAL HIGH (ref 0.358–3.740)

## 2021-08-28 LAB — HEMOGLOBIN A1C: HEMOGLOBIN A1C % (INT/EXT): 5.5 % (ref <5.6)

## 2021-08-28 LAB — HEMOGLOBIN A1C CARE EVERYWHERE: HEMOGLOBIN A1C CARE EVERYWHERE: 5.5 % (ref <5.6)

## 2021-08-28 MED ORDER — levothyroxine (Synthroid, Levoxyl) 50 mcg tablet
50 | ORAL_TABLET | Freq: Every day | ORAL | 0 refills | 90.00000 days | Status: AC
Start: 2021-08-28 — End: ?

## 2021-08-28 NOTE — Assessment & Plan Note (Signed)
 Pt will update thyroid labs today. Prescription for levothyroxine 50 mcg refilled.

## 2021-08-28 NOTE — Assessment & Plan Note (Signed)
 Pt will update a1c lab today.

## 2021-08-28 NOTE — Assessment & Plan Note (Signed)
 Pt is not followed by cardiologist. Was not provided follow-up instructions following diagnosis.

## 2021-08-28 NOTE — Progress Notes (Signed)
 [facility]  [address] [place] [address]  [phone]    Patient ID: Carolyn Young is a 75 y.o. female who presents for Thyroid Problem.    Subjective   HPI    Pt is new to office. Previously followed by Dr. Park Breed in [place], whose office closed without warning.   Dr. Silverio Lay will be her new PCP.     Thyroid  -ran out of levothyroxine medication in April  -current dose: 50 mcg  -pharmacy: [facility]    Vaccine Counseling  -pt had shingles in 2008 or 2009  -offered prescription for shingles vaccine     Carotid artery stenosis  -has not been informed what to do following test with this finding  -does not follow with specialist    Hx of Afib  -pt denies current problems with afib    Pt denies chest pain, palpitations, sob, nausea, vomiting, urinary sx. Reports occasional diarrhea - very rare.   Pt follows with ophthalmologist every 6 months.     Patient's medication, past medical history, social history, family history, and allergies reviewed and updated as needed.      Patient Active Problem List   Diagnosis   . Angiodysplasia of cecum   . Arthritis   . Carotid artery stenosis   . Carpal tunnel syndrome   . Colon polyp   . Disorder of bone and cartilage   . Encounter for screening for malignant neoplasm of colon   . Epistaxis   . Family health problem   . Female stress incontinence   . Hyperglycemia   . Hyperlipidemia   . Hypothyroidism   . Lichen planus   . Second hand smoke exposure   . Syncope   . Unspecified cataract     Current Outpatient Medications   Medication Instructions   . latanoprost (Xalatan) 0.005 % ophthalmic solution 1 drop, Both Eyes, Nightly   . levothyroxine (SYNTHROID, LEVOXYL) 50 mcg, oral, Daily     Allergies   Allergen Reactions   . Strawberry Rash     Unable to eat fresh strawberries, ok with the rest       Objective   Visit Vitals  BP 135/70 (BP Location: Left arm, Patient Position: Sitting, BP Cuff Size: Adult)   Pulse 78   Temp 36.2 C  (97.1 F)   Wt 69.4 kg   BMI 26.68 kg/m   OB Status Postmenopausal   BSA 1.76 m       Physical Exam  Vitals and nursing note reviewed.   Constitutional:       General: She is not in acute distress.     Appearance: Normal appearance. She is not ill-appearing.   HENT:      Head: Normocephalic and atraumatic.   Eyes:      Extraocular Movements: Extraocular movements intact.   Cardiovascular:      Rate and Rhythm: Normal rate and regular rhythm.      Pulses: Normal pulses.      Heart sounds: Normal heart sounds. No murmur heard.    No gallop.   Pulmonary:      Effort: Pulmonary effort is normal.      Breath sounds: Normal breath sounds.   Skin:     Coloration: Skin is not jaundiced or pale.   Neurological:      Mental Status: She is alert.   Psychiatric:         Mood and Affect: Mood normal.  Behavior: Behavior normal.         Thought Content: Thought content normal.         Judgment: Judgment normal.         Assessment/Plan   [name] was seen today for thyroid problem.  Hypothyroidism, unspecified type  Assessment & Plan:  Pt will update thyroid labs today. Prescription for levothyroxine 50 mcg refilled.   Orders:  -     TSH with reflex  -     T4, free  -     T3, total  -     levothyroxine (Synthroid, Levoxyl) 50 mcg tablet; Take 1 tablet (50 mcg) by mouth in the morning.  Stenosis of carotid artery, unspecified laterality  Assessment & Plan:  Pt is not followed by cardiologist. Was not provided follow-up instructions following diagnosis.   Orders:  -     CBC  -     Comprehensive metabolic panel  Hyperglycemia  Assessment & Plan:  Pt will update a1c lab today.   Orders:  -     Hemoglobin A1c  Immunization due  Comments:  Pt provided with prescription for shingles vaccine today.   Orders:  -     Zoster, Recombinant (Shingrix)  Other orders  -     Flu vaccine, high dose seasonal,quadrivalent, PF    By signing my name below, I, Suzi Roots, attest that this documentation has been prepared under the direction and  in the presence of Theresia Majors, NP. Electronically Signed Suzi Roots 08/28/21 3:18 PM

## 2021-09-11 ENCOUNTER — Ambulatory Visit: Admit: 2021-09-11 | Discharge: 2021-09-11 | Payer: MEDICARE | Attending: Ophthalmology | Primary: Family Medicine

## 2021-09-11 ENCOUNTER — Other Ambulatory Visit

## 2021-09-11 ENCOUNTER — Encounter (INDEPENDENT_AMBULATORY_CARE_PROVIDER_SITE_OTHER): Admitting: Ophthalmology

## 2021-09-11 DIAGNOSIS — S058X1A Other injuries of right eye and orbit, initial encounter: Secondary | ICD-10-CM
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
## 2021-09-13 ENCOUNTER — Telehealth (INDEPENDENT_AMBULATORY_CARE_PROVIDER_SITE_OTHER)
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
## 2021-09-18 ENCOUNTER — Other Ambulatory Visit

## 2021-09-18 ENCOUNTER — Ambulatory Visit: Admit: 2021-09-18 | Payer: MEDICARE | Attending: Family Medicine | Primary: Family Medicine

## 2021-09-18 ENCOUNTER — Encounter (INDEPENDENT_AMBULATORY_CARE_PROVIDER_SITE_OTHER): Admitting: Family Medicine

## 2021-09-18 DIAGNOSIS — M25511 Pain in right shoulder: Secondary | ICD-10-CM | POA: Insufficient documentation

## 2021-09-18 DIAGNOSIS — R519 Headache, unspecified: Secondary | ICD-10-CM | POA: Insufficient documentation

## 2021-09-18 MED ORDER — ibuprofen 600 mg tablet
600 | ORAL_TABLET | Freq: Three times a day (TID) | ORAL | 0 refills | Status: AC | PRN
Start: 2021-09-18 — End: 2021-11-17

## 2021-09-18 NOTE — Assessment & Plan Note (Addendum)
 Acute.  Reviewed and discussed pt's ED visit.  Discussed the issues below.

## 2021-09-18 NOTE — Assessment & Plan Note (Signed)
 Acute.  Suspect pt's pains are caused by muscular strain/sprain.  Encouraged pt to use ice/warm compress along with IBUPROFEN 3 times a day after each meal for pain relief.  Recommended pt to continue with light exercise and stretching.  Prescribed IBUPROFEN 600mg .  Pt will continue to monitor and f/u as necessary.
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
## 2021-09-27 ENCOUNTER — Ambulatory Visit: Admit: 2021-09-27 | Discharge: 2021-09-27 | Payer: MEDICARE | Primary: Family Medicine

## 2021-09-27 ENCOUNTER — Other Ambulatory Visit

## 2021-09-27 DIAGNOSIS — H401130 Primary open-angle glaucoma, bilateral, stage unspecified: Secondary | ICD-10-CM

## 2021-09-27 LAB — OCT, OPTIC NERVE - OU - BOTH EYES
Average RNFL Today (OD): 76
Average RNFL Today (OS): 69

## 2021-10-17 ENCOUNTER — Ambulatory Visit: Admit: 2021-10-17 | Discharge: 2021-10-17 | Payer: MEDICARE | Primary: Family Medicine

## 2021-10-17 ENCOUNTER — Other Ambulatory Visit

## 2021-10-17 DIAGNOSIS — H401130 Primary open-angle glaucoma, bilateral, stage unspecified: Secondary | ICD-10-CM

## 2021-11-13 ENCOUNTER — Encounter: Payer: MEDICARE | Attending: Family Medicine | Primary: Family Medicine

## 2021-12-24 ENCOUNTER — Telehealth (INDEPENDENT_AMBULATORY_CARE_PROVIDER_SITE_OTHER): Admitting: Family Medicine

## 2021-12-24 NOTE — Telephone Encounter (Signed)
Patient states "  She has diarreha 3 days, sometimes mild abdominal pain.  "    She schedule an appt with Porfirio Mylararmen for Wednesday.     Please call patint for triage.     thx  Nelia

## 2021-12-24 NOTE — Telephone Encounter (Signed)
Spoke with patient, reports having diarrhea for 3 days, denies any fever/chills, nausea/vomit, any recent diet changes or new foods, no other household members sick.     Patient took Pepto Bismol with slight relief, provided home care measures, including initiating liquid diet for first 24 hours then progressing to bland diet, avoiding diary, citrus or spicy foods, Continue with OTC Pepto-Bismol, may try Imodium, following label instructions. Pushing fluids. ED and call back precautions given. Patient is scheduled for office visit 12/26/21 10:30 AM.

## 2021-12-26 ENCOUNTER — Encounter: Payer: MEDICARE | Attending: Nurse Practitioner | Primary: Family Medicine
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
## 2022-01-03 ENCOUNTER — Encounter: Payer: MEDICARE | Attending: Family Medicine | Primary: Family Medicine

## 2022-01-18 ENCOUNTER — Encounter: Payer: MEDICARE | Attending: Ophthalmology

## 2022-01-29 ENCOUNTER — Encounter: Payer: MEDICARE | Attending: Ophthalmology
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
## 2022-04-17 ENCOUNTER — Other Ambulatory Visit: Payer: Self-pay

## 2022-04-17 ENCOUNTER — Ambulatory Visit: Payer: Medicare Other | Attending: Family Medicine

## 2022-04-17 ENCOUNTER — Encounter (HOSPITAL_BASED_OUTPATIENT_CLINIC_OR_DEPARTMENT_OTHER): Payer: Self-pay

## 2022-04-17 ENCOUNTER — Other Ambulatory Visit (HOSPITAL_BASED_OUTPATIENT_CLINIC_OR_DEPARTMENT_OTHER): Payer: Self-pay

## 2022-04-17 VITALS — BP 135/82 | HR 76 | Temp 96.8°F | Wt 136.6 lb

## 2022-04-17 DIAGNOSIS — Z13820 Encounter for screening for osteoporosis: Secondary | ICD-10-CM | POA: Diagnosis present

## 2022-04-17 DIAGNOSIS — E038 Other specified hypothyroidism: Secondary | ICD-10-CM | POA: Insufficient documentation

## 2022-04-17 DIAGNOSIS — R739 Hyperglycemia, unspecified: Secondary | ICD-10-CM | POA: Insufficient documentation

## 2022-04-17 DIAGNOSIS — E785 Hyperlipidemia, unspecified: Secondary | ICD-10-CM | POA: Insufficient documentation

## 2022-04-17 DIAGNOSIS — K5520 Angiodysplasia of colon without hemorrhage: Secondary | ICD-10-CM | POA: Diagnosis present

## 2022-04-17 DIAGNOSIS — N393 Stress incontinence (female) (male): Secondary | ICD-10-CM | POA: Diagnosis present

## 2022-04-17 DIAGNOSIS — Z0000 Encounter for general adult medical examination without abnormal findings: Secondary | ICD-10-CM | POA: Diagnosis present

## 2022-04-17 DIAGNOSIS — R159 Full incontinence of feces: Secondary | ICD-10-CM | POA: Insufficient documentation

## 2022-04-17 LAB — CBC WITH PLATELET
ABSOLUTE NRBC COUNT: 0 10*3/uL (ref 0.0–0.0)
HEMATOCRIT: 38.2 % (ref 34.1–44.9)
HEMOGLOBIN: 11.8 g/dL (ref 11.2–15.7)
MEAN CORP HGB CONC: 30.9 g/dL — ABNORMAL LOW (ref 31.0–37.0)
MEAN CORPUSCULAR HGB: 28.4 pg (ref 26.0–34.0)
MEAN CORPUSCULAR VOL: 92 fl (ref 80.0–100.0)
MEAN PLATELET VOLUME: 9.7 fL (ref 8.7–12.5)
NRBC %: 0 % (ref 0.0–0.0)
PLATELET COUNT: 331 10*3/uL (ref 150–400)
RBC DISTRIBUTION WIDTH STD DEV: 48.1 fL — ABNORMAL HIGH (ref 35.1–46.3)
RED BLOOD CELL COUNT: 4.15 M/uL (ref 3.90–5.20)
WHITE BLOOD CELL COUNT: 7.7 10*3/uL (ref 4.0–11.0)

## 2022-04-17 LAB — TSH (THYROID STIMULATING HORMONE): TSH (THYROID STIM HORMONE): 65.4 u[IU]/mL — ABNORMAL HIGH (ref 0.270–4.200)

## 2022-04-17 LAB — LIPID PANEL
Cholesterol: 205 mg/dL (ref 0–239)
HIGH DENSITY LIPOPROTEIN: 47 mg/dL (ref 40–60)
LOW DENSITY LIPOPROTEIN DIRECT: 123 mg/dL (ref 0–189)
TRIGLYCERIDES: 283 mg/dL — ABNORMAL HIGH (ref 0–150)

## 2022-04-17 LAB — COMPREHENSIVE METABOLIC PANEL
ALANINE AMINOTRANSFERASE: 13 U/L (ref 12–45)
ALBUMIN: 4.4 g/dL (ref 3.4–5.2)
ALKALINE PHOSPHATASE: 91 U/L (ref 45–117)
ANION GAP: 8 mmol/L — ABNORMAL LOW (ref 10–22)
ASPARTATE AMINOTRANSFERASE: 19 U/L (ref 8–34)
BILIRUBIN TOTAL: 0.4 mg/dL (ref 0.2–1.0)
BUN (UREA NITROGEN): 16 mg/dL (ref 7–18)
CALCIUM: 10 mg/dL (ref 8.5–10.5)
CARBON DIOXIDE: 30 mmol/L (ref 21–32)
CHLORIDE: 105 mmol/L (ref 98–107)
CREATININE: 0.8 mg/dL (ref 0.4–1.2)
ESTIMATED GLOMERULAR FILT RATE: >60 mL/min (ref >60)
Glucose Random: 108 mg/dL (ref 74–160)
POTASSIUM: 3.9 mmol/L (ref 3.5–5.1)
SODIUM: 143 mmol/L (ref 136–145)
TOTAL PROTEIN: 7.1 g/dL (ref 6.4–8.2)

## 2022-04-17 LAB — HEMOGLOBIN A1C
ESTIMATED AVERAGE GLUCOSE: 117 mg/dL (ref 74–160)
HEMOGLOBIN A1C: 5.7 % — ABNORMAL HIGH (ref 4.0–5.6)
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
## 2022-04-17 NOTE — Progress Notes (Signed)
Preceptor Note    I personally reviewed this case, along with the patient's history and exam findings, as forwarded to me by Dr. Marbas. I confirm the findings, and agree with the assessment and plan, as documented in the visit note.  Zlatan Hornback Bayo-Awoyemi, MD

## 2022-04-17 NOTE — Patient Instructions (Signed)
Please do the following after your visit:      Appointment: F/u incontincence in 4-6 weeks with PCP  If appointment not available: book with team    Labs: main hallway     Referrals: COLONOSCOPY/ENDOSCOPY - [phone], GASTROENTEROLOGY (GI)  [phone], PHYSICAL THERAPY, [facility], SPEECH THERAPY  [phone]

## 2022-04-18 ENCOUNTER — Encounter (HOSPITAL_BASED_OUTPATIENT_CLINIC_OR_DEPARTMENT_OTHER): Payer: Self-pay

## 2022-04-18 LAB — CMP (EXT)
ALT/SGPT (EXT): 13 U/L (ref 12–45)
AST/SGOT (EXT): 19 U/L (ref 8–34)
Albumin (EXT): 4.4 g/dL (ref 3.4–5.2)
Alkaline Phosphatase (EXT): 91 U/L (ref 45–117)
Anion Gap (EXT): 8 mmol/L — ABNORMAL LOW (ref 10–22)
BUN (EXT): 16 mg/dL (ref 7–18)
Bilirubin, Total (EXT): 0.4 mg/dL (ref 0.2–1.0)
CO2 (EXT): 30 mmol/L (ref 21–32)
CalciumCalcium (EXT): 10 mg/dL (ref 8.5–10.5)
Chloride (EXT): 105 mmol/L (ref 98–107)
Creatinine (EXT): 0.8 mg/dL (ref 0.4–1.2)
Glucose (EXT): 108 mg/dL (ref 74–160)
Potassium (EXT): 3.9 mmol/L (ref 3.5–5.1)
Protein (EXT): 7.1 g/dL (ref 6.4–8.2)
Sodium (EXT): 143 mmol/L (ref 136–145)
eGFR - Creat CKD-EPI (EXT): >60 mL/min (ref >60)

## 2022-04-18 LAB — LIPID PROFILE (EXT)
Cholesterol (EXT): 205 mg/dL (ref 0–239)
HDL Cholesterol (EXT): 47 mg/dL (ref 40–60)
LDL Cholesterol (EXT): 123 mg/dL (ref 0–189)
Triglycerides (EXT): 283 mg/dL — ABNORMAL HIGH (ref 0–150)

## 2022-04-23 ENCOUNTER — Other Ambulatory Visit (HOSPITAL_BASED_OUTPATIENT_CLINIC_OR_DEPARTMENT_OTHER): Payer: Medicare Other

## 2022-04-23 DIAGNOSIS — R159 Full incontinence of feces: Secondary | ICD-10-CM | POA: Insufficient documentation

## 2022-05-01 ENCOUNTER — Ambulatory Visit
Admission: RE | Admit: 2022-05-01 | Discharge: 2022-05-01 | Disposition: A | Discharge status: Home / Self Care | Payer: Medicare Other

## 2022-05-01 ENCOUNTER — Other Ambulatory Visit: Payer: Self-pay

## 2022-05-01 DIAGNOSIS — Z13820 Encounter for screening for osteoporosis: Secondary | ICD-10-CM

## 2022-05-01 DIAGNOSIS — Z780 Asymptomatic menopausal state: Secondary | ICD-10-CM

## 2022-05-01 DIAGNOSIS — M810 Age-related osteoporosis without current pathological fracture: Secondary | ICD-10-CM | POA: Diagnosis present

## 2022-05-03 ENCOUNTER — Telehealth (HOSPITAL_BASED_OUTPATIENT_CLINIC_OR_DEPARTMENT_OTHER): Payer: Self-pay

## 2022-05-03 DIAGNOSIS — R159 Full incontinence of feces: Secondary | ICD-10-CM

## 2022-05-03 NOTE — Telephone Encounter (Signed)
Beth Hudson [id number], 76 year old, female    Calls today:  Clinical Questions ([place])    Name of person calling [facility]  Specific nature of request Requesting call back from pcp about body control   Return phone number [phone]    Person calling on behalf of patient: Patient (self)        Patient's language of care: English    Patient does not need an interpreter.    Patient's PCP: Johny Shock, MD    Primary Care Home Site:  [facility]

## 2022-05-03 NOTE — Telephone Encounter (Signed)
Returned call to patient. Did reach her.    She states that control of her body is worsening. She was recently seen for an office visit on May 24th and knows she has a follow up on July 10th.     This past Monday, she experienced diarrhea at work so went home early, and then had episodes of diarrhea all night long. The Pepto Bismol finally worked, she said, but she has had intermittent diarrhea all week and sometimes, she does not feel herself going until she checks herself in the bathroom.    She would like an earlier visit, if possible, but nothing earlier available with Dr. Eloise Levels, and she would like to stick with Dr. Eloise Levels. She states that she has Wednesdays off at work so could easily come in anytime that Dr. Eloise Levels has time then.    Notified patient to continue to use the adult diapers and Pepto Bismol as called for and that I will send this message to Dr. Eloise Levels to advise and that we will call patient back with update.

## 2022-05-04 NOTE — Telephone Encounter (Signed)
Called patient and did reach her.    Offered 4p on 6/14, but she cannot do this as she has bingo - she apologized as she did not realize that she does have some restrictions for Wednesdays, and she does not miss her [employer], she reports.    She will call the clinic on Monday to share what her availability is as her new work schedule comes out. She will go to ER if her symptoms worsen/develops dizziness/vomiting/dehydration.        Johny Shock, MD  [facility]; [facility] Admin Pool 15 hours ago (5:42 PM)     EM  Thanks for calling her! I took a look at my schedule and looks like I only have a 3:40 booked as my last patient on next Wednesday 6/14. Admins, would it be possible to book into that 4/4:20 slot for this patient?     I had ordered some stool studies at our last appointment that haven't been dropped off yet. If she has the stool collection kit and could bring that in, it would be super helpful so I could let her know what medications would be safe to use. Please also let her know that if she is unable to keep up her hydration, feels dizzy, or starts to vomit, she should come into the ED.     Thanks,   Raquel Sarna

## 2022-05-08 ENCOUNTER — Telehealth (HOSPITAL_BASED_OUTPATIENT_CLINIC_OR_DEPARTMENT_OTHER): Payer: Self-pay

## 2022-05-08 NOTE — Telephone Encounter (Signed)
Called patient to check in on GI sx, no answer so LVM. Have 3 day in slot available next Wednesday 6/21 at 3:00 if she would like to be seen sooner than 7/10.     Joya Gaskins, MD    6:21 PM  05/08/22

## 2022-05-10 ENCOUNTER — Telehealth (HOSPITAL_BASED_OUTPATIENT_CLINIC_OR_DEPARTMENT_OTHER): Payer: Self-pay

## 2022-05-10 DIAGNOSIS — E038 Other specified hypothyroidism: Secondary | ICD-10-CM

## 2022-05-10 MED ORDER — LEVOTHYROXINE SODIUM 50 MCG PO TABS
50.0000 ug | ORAL_TABLET | Freq: Every morning | ORAL | 2 refills | Status: DC
Start: 2022-05-10 — End: 2022-07-12

## 2022-05-10 NOTE — Telephone Encounter (Signed)
Spoke to patient about bowel movements, have improved since last contacted clinic. Last diarrheal episode was on Sunday, does not think it was related to anything she ate. Will try to drop off stool samples on Monday morning. Aware of upcoming appointments for MRI and f/u.     Also discussed restarting levothyroxine based on most recent TSH. Patient in agreement, will restart at previous dose of 22mg daily.     EJoya Gaskins MD    6:33 PM  05/10/22

## 2022-05-19 ENCOUNTER — Ambulatory Visit
Admission: RE | Admit: 2022-05-19 | Discharge: 2022-05-19 | Disposition: A | Discharge status: Home / Self Care | Payer: Medicare Other

## 2022-05-19 ENCOUNTER — Other Ambulatory Visit: Payer: Self-pay

## 2022-05-19 ENCOUNTER — Other Ambulatory Visit (HOSPITAL_BASED_OUTPATIENT_CLINIC_OR_DEPARTMENT_OTHER): Payer: Self-pay

## 2022-05-19 DIAGNOSIS — R159 Full incontinence of feces: Secondary | ICD-10-CM

## 2022-05-19 DIAGNOSIS — S3992XA Unspecified injury of lower back, initial encounter: Secondary | ICD-10-CM | POA: Insufficient documentation

## 2022-05-21 ENCOUNTER — Ambulatory Visit: Payer: Medicare Other

## 2022-05-21 DIAGNOSIS — R159 Full incontinence of feces: Secondary | ICD-10-CM | POA: Diagnosis present

## 2022-05-21 NOTE — Progress Notes (Signed)
Received Stool specimen in the lab. Orders released and sent to main lab for testing.     [place], [place], 05/21/2022

## 2022-05-21 NOTE — Progress Notes (Signed)
Dear MAs   Please:     1. Select the language (English) appropriate Letter Template.       2. Insert the following comments:           "Your MRI results show mild degenerative changes, which are likely chronic. I recommend we discuss your goals and next steps at your upcoming follow up appointment"    Signed, Pryor Guettler, Georganna Skeans, MD"     3. Send the letter to the patient.    Thank you,  Sweetie Giebler, Georganna Skeans, MD

## 2022-05-22 LAB — ENTERIC BACTERIAL PANEL PCR
CAMPYLOBACTER PCR: NOT DETECTED
SALMONELLA PCR: NOT DETECTED
SHIGELLA PCR: NOT DETECTED

## 2022-05-22 LAB — FAT AND FIBER STOOL

## 2022-06-03 ENCOUNTER — Ambulatory Visit: Payer: Medicare Other | Attending: Family Medicine

## 2022-06-03 ENCOUNTER — Encounter (HOSPITAL_BASED_OUTPATIENT_CLINIC_OR_DEPARTMENT_OTHER): Payer: Self-pay

## 2022-06-03 ENCOUNTER — Other Ambulatory Visit: Payer: Self-pay

## 2022-06-03 VITALS — BP 124/62 | HR 64 | Temp 96.2°F | Wt 133.0 lb

## 2022-06-03 DIAGNOSIS — R159 Full incontinence of feces: Secondary | ICD-10-CM | POA: Diagnosis present

## 2022-06-03 DIAGNOSIS — Z9181 History of falling: Secondary | ICD-10-CM | POA: Insufficient documentation

## 2022-06-03 NOTE — Patient Instructions (Signed)
Please do the following after your visit:     Immunizations: please stay in the room     Appointment: F/u in 1 month with PCP for thyroid  If appointment not available: Book per patient preference with PCP       Referrals: COLONOSCOPY/ENDOSCOPY - [phone]    Google: Chair yoga

## 2022-06-03 NOTE — Progress Notes (Signed)
[facility]  Office Visit Note     Subjective:   Beth Hudson is a 76 year old female patient who presents today for: Patient presents with:  Follow Up      # Fecal Incontinence  - Has resolved  - No changes to diet  - Not sure what was causing it  - No constipation  - Normal BM every month  - Last colonoscopy 2015    #Fall risk  - [place] once a few weeks ago  - Was walking down hallway with breakfast and just went down  - Thinks maybe tripped but not sure what on  - No other falls  - No headstrike, LOC, heart palpitations  - Walks to/from clinic, work sometimes  - Does feel like balance has decreased a bit     No interpreter was required during this encounter.     ROS:  Review of systems conducted and pertinent review of systems as noted in HPI.    Patient Active Problem List:     Unspecified hypothyroidism     Epistaxis     Carpal tunnel syndrome     Disorder of bone and cartilage, unspecified     Family health problem     Second hand smoke exposure     Lichen planus      [facility] [phone] Option #4     Colon polyps, adenomatous     Angiodysplasia of cecum     Acute pain of both shoulders     Arthritis     Carotid artery stenosis     Cataract     Encounter for screening for malignant neoplasm of colon     Stress incontinence of urine     Hyperglycemia     Hyperlipidemia     Right facial pain     Syncope     Incontinence of feces      MEDS:    Current Outpatient Medications:   .  latanoprost (XALATAN) 0.005 % ophthalmic solution, INSTILL 1 DROP INTO BOTH EYES AT BEDTIME, Disp: , Rfl:   .  levothyroxine (SYNTHROID) 50 MCG tablet, Take 1 tablet by mouth in the morning, Disp: 30 tablet, Rfl: 2    Review of Patient's Allergies indicates:   Strawberry              Rash    Comment:Unable to eat fresh strawberries, ok with the             restUnable to eat fresh strawberries, ok with the             restUnable to eat fresh strawberries, ok with the             restUnable to eat  fresh strawberries, ok with the             restUnable to eat fresh strawberries, ok with the             restUnable to eat fresh strawberries, ok with the             rest    Objective:   BP 124/62   Pulse 64   Temp 96.2 F (35.7 C) (Temporal)   Wt 60.3 kg (133 lb)   SpO2 99%   BMI 20.83 kg/m   General: No acute distress, well appearing.  HEENT:  PERRL, EOMI, sclera non-injected.   Cardio: Regular rate and rhythm, S1, S2 normal. No murmur, rubs or gallops.  Pulm: Clear to  auscultation bilaterally, no wheezing, rhonchi or crackles.  Ext: Warm, well-perfused. No lower extremity edema bilaterally.  Neuro: AOx3. CN grossly intact. Gait normal.    Assessment & Plan:    Beth Hudson is a 76 year old female who presented to clinic today for Patient presents with:  Follow Up       Problem List Items Addressed This Visit        Gastrointestinal and Abdominal    Incontinence of feces     A: diarrhea and incontinence spontaneously resolved, unclear why. Pt defers further workup at this time    P:   - Will still obtain colonoscopy given hx of polyps, cecal angiodysplasia  - Will continue to monitor  - Can consider imodium in the future for symptomatic treatment if recurs          Other Visit Diagnoses     At risk for falling            Discussed fall risk, currently independently mobile and walks several miles daily. Discussed chair yoga to continue to improve balance.       F/u in 1 month to check TSH, BP (elevated today, normal on repeat)    I have reviewed the past medical, family, and social history sections including the medications and allergies listed in the above medical record. I reconciled the patient's medication list and prepared and supplied needed refills. The patient indicates understanding of the above issues and agrees with the plan. The patient has been given an After Visit Summary sheet that lists all of their medications with directions, their allergies, orders placed during this encounter,  immunization dates, and follow- up instructions.    Discussed with Edwinna Areola, MD.     Raquel Sarna Levi Aland, MD, 06/03/2022

## 2022-06-03 NOTE — Progress Notes (Signed)
Preceptor Note  I personally reviewed this case, along with the patient's history and exam findings, with Dr. Marbas. I confirm the findings, and agree with the assessment and plan, as documented in the visit note.    Phyllis Abelson F O'Connor, MD

## 2022-06-03 NOTE — Progress Notes (Signed)
PRECEPTOR NOTE:  On the day of the patient's visit, I reviewed the key elements of the history & physical exam and the findings as described in the provider's note. I agree with the assessment and plan as described in the documentation. Please see provider note for further details.    46yoF with pmhx of fecal incontinence presenting for incontinence and has poorly controlled BP today on initial presentation

## 2022-06-03 NOTE — Assessment & Plan Note (Signed)
A: diarrhea and incontinence spontaneously resolved, unclear why. Pt defers further workup at this time    P:   - Will still obtain colonoscopy given hx of polyps, cecal angiodysplasia  - Will continue to monitor  - Can consider imodium in the future for symptomatic treatment if recurs

## 2022-07-10 ENCOUNTER — Other Ambulatory Visit: Payer: Self-pay

## 2022-07-10 ENCOUNTER — Ambulatory Visit: Payer: Medicare Other | Attending: Family Medicine

## 2022-07-10 VITALS — BP 122/59 | HR 70 | Temp 96.4°F | Wt 132.0 lb

## 2022-07-10 DIAGNOSIS — R030 Elevated blood-pressure reading, without diagnosis of hypertension: Secondary | ICD-10-CM | POA: Insufficient documentation

## 2022-07-10 DIAGNOSIS — R7303 Prediabetes: Secondary | ICD-10-CM | POA: Diagnosis present

## 2022-07-10 DIAGNOSIS — E038 Other specified hypothyroidism: Secondary | ICD-10-CM | POA: Insufficient documentation

## 2022-07-10 DIAGNOSIS — L600 Ingrowing nail: Secondary | ICD-10-CM | POA: Diagnosis present

## 2022-07-10 NOTE — Patient Instructions (Signed)
Please do the following after your visit:       Labs: main hallway     Referrals: PODIATRY  [phone]

## 2022-07-10 NOTE — Progress Notes (Signed)
[facility]  Office Visit Note     Subjective:   Beth Hudson is a 76 year old female patient who presents today for: Patient presents with:  Follow Up: The two pig toes hurt       #Toe pain  - Bilateral great toes bruised over toenails  - Wears a lot of different shoes  - Can't think of any specific trauma  - R great toenail painful, growing sideways  - Walks a lot       No interpreter was required during this encounter.     ROS:  Review of systems conducted and pertinent review of systems as noted in HPI.    Patient Active Problem List:     Unspecified hypothyroidism     Epistaxis     Carpal tunnel syndrome     Disorder of bone and cartilage, unspecified     Family health problem     Second hand smoke exposure     Lichen planus      [facility] [phone] Option #4     Colon polyps, adenomatous     Angiodysplasia of cecum     Acute pain of both shoulders     Arthritis     Carotid artery stenosis     Cataract     Encounter for screening for malignant neoplasm of colon     Stress incontinence of urine     Hyperglycemia     Hyperlipidemia     Right facial pain     Syncope     Incontinence of feces      MEDS:    Current Outpatient Medications:   .  latanoprost (XALATAN) 0.005 % ophthalmic solution, INSTILL 1 DROP INTO BOTH EYES AT BEDTIME, Disp: , Rfl:   .  levothyroxine (SYNTHROID) 50 MCG tablet, Take 1 tablet by mouth in the morning, Disp: 30 tablet, Rfl: 2    Review of Patient's Allergies indicates:   Strawberry              Rash    Comment:Unable to eat fresh strawberries, ok with the             restUnable to eat fresh strawberries, ok with the             restUnable to eat fresh strawberries, ok with the             restUnable to eat fresh strawberries, ok with the             restUnable to eat fresh strawberries, ok with the             restUnable to eat fresh strawberries, ok with the             rest    Objective:   BP 122/59   Pulse 70   Temp 96.4 F (35.8 C)  (Temporal)   Wt 59.9 kg (132 lb)   SpO2 97%   BMI 20.67 kg/m   General: No acute distress, well appearing.  HEENT:  PERRL, EOMI, sclera non-injected.   Cardio: Regular rate and rhythm, S1, S2 normal. No murmur, rubs or gallops.  Pulm: Clear to auscultation bilaterally, no wheezing, rhonchi or crackles.  Ext: Warm, well-perfused. No lower extremity edema bilaterally. Bruising present at the base of bilateral great toenails. Toenail of R great toe protruding into skin fold on medial side, no signs of overlying infection  Neuro: AOx3. CN grossly intact. Gait normal.  Assessment & Plan:    Beth Hudson is a 76 year old female who presented to clinic today for Patient presents with:  Follow Up: The two pig toes hurt        1. Ingrown toenail of right foot  Pt with ingrown toenail of right great toe, no overlying infection or indication for antibiotics. Will refer to podiatry to assess if procedure is indicated.   - REFERRAL TO PODIATRY (INT)    2. Other specified hypothyroidism  Check TSH today, currently feeling well on 50 mcg. Will adjust per results  - THYROID SCREEN TSH REFLEX FT4    3. Elevated BP without diagnosis of hypertension  Elevated BP in the past without diagnosis of HTN. BP well within goal today, no indication to start medications        I have reviewed the past medical, family, and social history sections including the medications and allergies listed in the above medical record. I reconciled the patient's medication list and prepared and supplied needed refills. The patient indicates understanding of the above issues and agrees with the plan. The patient has been given an After Visit Summary sheet that lists all of their medications with directions, their allergies, orders placed during this encounter, immunization dates, and follow- up instructions.    Discussed with Daron Offer, MD.     Johny Shock, MD, 07/10/2022

## 2022-07-10 NOTE — Progress Notes (Signed)
PRECEPTOR NOTE:  On the day of the patient's visit, I personally reviewed this case. I discussed the key elements of the history and exam with Dr. Marbas and I agree with the assessment and plan as documented in the visit note.     Antia Rahal, MD

## 2022-07-11 LAB — FREE THYROXINE: FREE THYROXINE: 0.81 ng/dL — ABNORMAL LOW (ref 0.93–1.70)

## 2022-07-11 LAB — THYROID SCREEN TSH REFLEX FT4: THYROID SCREEN TSH REFLEX FT4: 15.4 u[IU]/mL — ABNORMAL HIGH (ref 0.270–4.200)

## 2022-07-11 LAB — ADD ON CAN'T BE DONE CHEMISTRY

## 2022-07-11 NOTE — Addendum Note (Signed)
Addended by: Johny Shock on: 07/11/2022 11:12 AM     Modules accepted: Orders

## 2022-07-12 ENCOUNTER — Telehealth (HOSPITAL_BASED_OUTPATIENT_CLINIC_OR_DEPARTMENT_OTHER): Payer: Self-pay

## 2022-07-12 DIAGNOSIS — I6522 Occlusion and stenosis of left carotid artery: Secondary | ICD-10-CM

## 2022-07-12 DIAGNOSIS — E038 Other specified hypothyroidism: Secondary | ICD-10-CM

## 2022-07-12 MED ORDER — ATORVASTATIN CALCIUM 40 MG PO TABS
40.0000 mg | ORAL_TABLET | Freq: Every day | ORAL | 2 refills | Status: DC
Start: 2022-07-12 — End: 2022-07-24

## 2022-07-12 MED ORDER — LEVOTHYROXINE SODIUM 75 MCG PO TABS
75.0000 ug | ORAL_TABLET | Freq: Every morning | ORAL | 2 refills | Status: DC
Start: 2022-07-12 — End: 2022-07-24

## 2022-07-12 MED ORDER — ASPIRIN 81 MG PO TBEC
81.0000 mg | DELAYED_RELEASE_TABLET | Freq: Every day | ORAL | 2 refills | Status: DC
Start: 2022-07-12 — End: 2022-07-24

## 2022-07-12 NOTE — Telephone Encounter (Signed)
Discussed TSH level being above goal, will increase dose to 75 mcg.     Also discussed hx of carotid artery stenosis and current HLD. Recommended starting statin and ASA, which patient is in agreement with. F/u in 6 weeks to check TSH.     The 10-year ASCVD risk score (Arnett DK, et al., 2019) is: 14.7%    Values used to calculate the score:      Age: 75 years      Sex: Female      Is Non-Hispanic African American: No      Diabetic: No      Tobacco smoker: No      Systolic Blood Pressure: 233 mmHg      Is BP treated: No      HDL Cholesterol: 47 mg/dL      Total Cholesterol: 205 mg/dL    Joya Gaskins, MD    6:00 PM  07/12/22

## 2022-07-15 ENCOUNTER — Telehealth (HOSPITAL_BASED_OUTPATIENT_CLINIC_OR_DEPARTMENT_OTHER): Payer: Self-pay

## 2022-07-15 NOTE — Telephone Encounter (Signed)
I called the patient and Left a voicemail to return call    If patient calls back, please: Book the appointment per the following guidance: schedule in person appt with pcp see DR note for more information

## 2022-07-15 NOTE — Telephone Encounter (Signed)
-----   Message from Johny Shock, MD sent at 07/12/2022  6:03 PM EDT -----  Beth Hudson  [id number]  [place] [[id number]]    Please assist this patient with the following:    Schedule in-person visit for thyroid f/u, time frame: 6 weeks, with PCP, if appointment not available: Schedule further out with provider indicated    Thanks!  Raquel Sarna

## 2022-07-24 ENCOUNTER — Other Ambulatory Visit (HOSPITAL_BASED_OUTPATIENT_CLINIC_OR_DEPARTMENT_OTHER): Payer: Self-pay

## 2022-07-24 DIAGNOSIS — I6522 Occlusion and stenosis of left carotid artery: Secondary | ICD-10-CM

## 2022-07-24 DIAGNOSIS — E038 Other specified hypothyroidism: Secondary | ICD-10-CM

## 2022-07-24 MED ORDER — LEVOTHYROXINE SODIUM 75 MCG PO TABS
75.0000 ug | ORAL_TABLET | Freq: Every morning | ORAL | 3 refills | Status: DC
Start: 2022-07-24 — End: 2023-08-13

## 2022-07-24 MED ORDER — ASPIRIN 81 MG PO TBEC
81.00 mg | DELAYED_RELEASE_TABLET | Freq: Every day | ORAL | 3 refills | Status: AC
Start: 2022-07-24 — End: 2023-07-24

## 2022-07-24 MED ORDER — ATORVASTATIN CALCIUM 40 MG PO TABS
40.0000 mg | ORAL_TABLET | Freq: Every day | ORAL | 3 refills | Status: DC
Start: 2022-07-24 — End: 2024-09-29

## 2022-07-24 NOTE — Telephone Encounter (Signed)
Per Pharmacy, Beth Hudson is a 76 year old female has requested a refill of ASPIRIN, LEVOTHYROXINE, ATOVASTATIN    >>>> Preferred Pharmacy.    Documented patient preferred pharmacies:    [facility] [place], [place]  Phone: [phone] Fax: [phone]

## 2022-09-06 NOTE — Telephone Encounter (Signed)
Fax from [facility] patient request refilled of Ibuprofen600. Patient changed PCP

## 2022-09-12 ENCOUNTER — Ambulatory Visit (HOSPITAL_BASED_OUTPATIENT_CLINIC_OR_DEPARTMENT_OTHER): Payer: Medicare Other | Admitting: Primary Podiatric Medicine

## 2022-09-25 ENCOUNTER — Inpatient Hospital Stay: Admit: 2022-09-25 | Payer: MEDICARE

## 2022-09-25 ENCOUNTER — Inpatient Hospital Stay: Admit: 2022-09-25 | Discharge: 2022-09-25 | Disposition: A | Discharge status: Home / Self Care | Payer: MEDICARE

## 2022-09-25 DIAGNOSIS — M5442 Lumbago with sciatica, left side: Secondary | ICD-10-CM

## 2022-09-25 MED ORDER — cyclobenzaprine (Flexeril) 5 mg tablet
5 | ORAL_TABLET | Freq: Three times a day (TID) | ORAL | 0 refills | Status: DC | PRN
Start: 2022-09-25 — End: 2022-10-22

## 2022-09-25 NOTE — ED Triage Notes (Signed)
Pt presents to ED c/o atraumatic L leg pain, describes pain startes in L lower back and radiates down L Leg,  pain unrelieved w/ OTC medications, pain w/ ambulation. Able to bear weight. LLE +CSM noted.

## 2022-09-25 NOTE — ED Notes (Signed)
Pt reports L leg pain since Saturday. Pain starts in L hip and radiates down into L calf. Pain is constant 9/10. Taken Motrin, Tylenol, Advil with no relief. No swelling/redness noted in L calf.      Mliss Fritz, RN  09/25/22 [phone]
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
## 2022-09-25 NOTE — Discharge Instructions (Addendum)
You were evaluated for left back, hip and leg pain.  We suspect you have sciatica.  Please rest, apply warm compresses, massage and lightly stretch.  You can take Tylenol 500 mg every 6-8 hours as needed for discomfort.  Prescribed you a muscle relaxer but please be aware that this can increase your risk of falling and will make you sleepy.  It is best that you take this for bedtime.  Please call your PCP for an appointment by next week.  Please return or seek medical attention with any worsening or new concerning symptoms, including fever, severe pain, numbness or tingling to your groin area, leg weakness or changes in urination or bowel habits.

## 2022-09-25 NOTE — ED Notes (Signed)
Instructions reviewed with pt. Denies any questions at this time.      Mliss Fritz, RN  09/25/22 1053

## 2022-09-25 NOTE — ED Notes (Signed)
Pt to XR with transport.      Mliss Fritz, RN  09/25/22 [phone]

## 2022-09-25 NOTE — ED Notes (Signed)
All results reviewed by ED PA. Pt to be D/C home.      Mliss Fritz, RN  09/25/22 1051

## 2022-10-01 ENCOUNTER — Inpatient Hospital Stay: Admit: 2022-10-01 | Discharge: 2022-10-01 | Disposition: A | Discharge status: Home / Self Care | Payer: MEDICARE

## 2022-10-01 ENCOUNTER — Inpatient Hospital Stay: Admit: 2022-10-01 | Payer: MEDICARE

## 2022-10-01 ENCOUNTER — Ambulatory Visit (HOSPITAL_BASED_OUTPATIENT_CLINIC_OR_DEPARTMENT_OTHER): Payer: Self-pay | Admitting: Registered Nurse

## 2022-10-01 ENCOUNTER — Telehealth (HOSPITAL_BASED_OUTPATIENT_CLINIC_OR_DEPARTMENT_OTHER): Payer: Self-pay

## 2022-10-01 DIAGNOSIS — M545 Low back pain, unspecified: Secondary | ICD-10-CM

## 2022-10-01 MED ORDER — ketorolac (Toradol) injection 15 mg
15 | Freq: Once | INTRAMUSCULAR | Status: DC
Start: 2022-10-01 — End: 2022-10-01

## 2022-10-01 MED ORDER — ketorolac (Toradol) injection 15 mg
60 | Freq: Once | INTRAMUSCULAR | Status: AC
Start: 2022-10-01 — End: 2022-10-01
  Administered 2022-10-01: 18:00:00 15 mg via INTRAMUSCULAR

## 2022-10-01 MED FILL — KETOROLAC 60 MG/2 ML INTRAMUSCULAR SOLUTION: 60 60 mg/2 mL | INTRAMUSCULAR | Qty: 2

## 2022-10-01 NOTE — ED Notes (Signed)
Pt to dc home. Scripts sent to pharmacy. Paperwork provided and all questions answered. Ambulatory to ed exit with steady gait and all personal belongings     Garth Bigness, RN  10/01/22 1448

## 2022-10-01 NOTE — ED Notes (Signed)
Pt a/ox4 reports ongoing left leg pain with ambulation. Pt denies numbness/tingling down extremities. Able to move all extremities +csm +pulses. Denies bladder/bowel inc/abd pain/dizziness/cp/sob. Pt able to ambulate with steady gait- reports 10/10 left leg pain radiating from Left lower back to left calf     Garth BignessFrancesca Tomy Khim, RN  10/01/22 1111
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
## 2022-10-01 NOTE — ED Triage Notes (Signed)
Pt ambulatory to triage w/ c/o pain from L lower back down her L leg. Pt was seen previously for this, was here last week and was at [facility] yesterday, workup included XR and [place] that were negative per pt. Pt has hx of sciatic pain. Pt was given prednisone rx and also tried biofreeze with no relief. Pt A&OX4, VSS, in NAD.

## 2022-10-01 NOTE — Discharge Instructions (Addendum)
You were seen today for ongoing left low back/left leg symptoms.  We repeated your ultrasound which continues to show no evidence of blood clot.  Your symptoms nearly fully resolved after Toradol which is an NSAID medication like ibuprofen.  Your symptoms are likely secondary to sciatic nerve pain.  You should continue your steroid as prescribed, likely to soon after starting for any noticeable benefit at this point.  Continue to use muscle relaxer (do not combine your flexeril/robaxin as previously prescribed, only use one) as needed though reserve these for before bedtime as this is likely to cause drowsiness and can increase your risk of falls and confusion.  Avoid driving, operating heavy machinery, and do not combine with alcohol or other sedating medications.     You should also rest, ice affected area 20 minutes/h multiple times daily, alternate Motrin and Tylenol (do not take Motrin for at least 8 hours as we gave you Toradol here today).  Please follow-up with your primary care provider in the next few days.  Return to the emergency room with any new or worsening symptoms or other concerns.

## 2022-10-01 NOTE — ED Notes (Signed)
Back from ultrasound. Pt states left leg pain is 10/10- Medicated with 15mg  IM Toradol. Resting in stretcher at this time. Denies any other complaints     Garth Bigness, RN  10/01/22 1318

## 2022-10-01 NOTE — Telephone Encounter (Signed)
Pt was seen in [facility] ED today and on 11/1 for leg pain dx with sciatic  Today, pt had an U/S and received a toradol injection with effect  Pt states that she has right hip pain going down to right buttock, right leg/calf  Pt needs f/u appt  No appt avail until 12/8 at this time  Will cc PCP

## 2022-10-01 NOTE — Telephone Encounter (Signed)
Message from [place] sent at 10/01/2022 10:01 AM EST    Summary: left leg pain    Patient states she was seen on 09/25/22 for left leg pain at the [facility], but pain is getting worst. She states the pain starts at the hip and it radiates to her calf

## 2022-10-01 NOTE — Telephone Encounter (Signed)
Reason for Disposition   Patient sounds very sick or weak to the triager    Answer Assessment - Initial Assessment Questions  Beth Seltzer, RN, 10/01/2022  TC from pt transferred to [facility].  Rn confirmed pt's name and DOB.  She reports was seen at [facility] on 11/1 for worsening left leg pain starting at the hip and radiating down her left to her left food and ankle. Has had this pain intermittently over past several months.  Was tx'ed with a med they only have her a handful of.  Pain persistent yest, so went to [facility] in [place].  They tx'ed her with Prednisone and Cyclobenzaprine.  Pain still "at a 20 out of 10!"  Wants reeval ASAP.    Protocols used: Leg Pain-A-OH    Advised per nursing triage protocol.  Verbalized understanding and agreement with instructions and disposition.     Recommended disposition for patient:Disposition: Advised to go to Emergency Department due to appointment availability or time of day    If patient referred to UC/ED advised that they may require further follow up and testing after the visit with their primary care office.     Instructed patient to call back for any new, worsening, or worrisome symptoms or concerns any time day or night.

## 2022-10-01 NOTE — Telephone Encounter (Signed)
Called pt to let her know that PCP is offering her a 3 pm on Saturday  Pt accepts appointment

## 2022-10-02 NOTE — ED Notes (Signed)
Call made.  Pt states is still uncomfortable.  Taking medications as prescribed.  Has a follow up with PCP on Saturday.     Dalene CarrowPatricia Taber Sweetser, RN  10/02/22 [phone]

## 2022-10-05 ENCOUNTER — Ambulatory Visit: Payer: Medicare Other | Attending: Family Medicine

## 2022-10-05 ENCOUNTER — Other Ambulatory Visit: Payer: Self-pay

## 2022-10-05 VITALS — BP 152/76 | HR 83 | Temp 97.8°F | Wt 126.0 lb

## 2022-10-05 DIAGNOSIS — M5442 Lumbago with sciatica, left side: Secondary | ICD-10-CM | POA: Diagnosis present

## 2022-10-05 DIAGNOSIS — G8929 Other chronic pain: Secondary | ICD-10-CM | POA: Insufficient documentation

## 2022-10-05 DIAGNOSIS — R195 Other fecal abnormalities: Secondary | ICD-10-CM | POA: Diagnosis present

## 2022-10-05 DIAGNOSIS — M62830 Muscle spasm of back: Secondary | ICD-10-CM | POA: Insufficient documentation

## 2022-10-05 DIAGNOSIS — E038 Other specified hypothyroidism: Secondary | ICD-10-CM | POA: Diagnosis present

## 2022-10-05 LAB — TSH (THYROID STIMULATING HORMONE): TSH (THYROID STIM HORMONE): 51.8 u[IU]/mL — ABNORMAL HIGH (ref 0.270–4.200)

## 2022-10-05 MED ORDER — CYCLOBENZAPRINE HCL 10 MG PO TABS
10.00 mg | ORAL_TABLET | Freq: Three times a day (TID) | ORAL | 1 refills | Status: AC | PRN
Start: 2022-10-05 — End: 2022-10-19

## 2022-10-05 NOTE — Patient Instructions (Signed)
Please do the following after your visit:       Labs: main hallway     Referrals: COLONOSCOPY/ENDOSCOPY - [phone]

## 2022-10-05 NOTE — Progress Notes (Signed)
[facility]  Office Visit Note     Subjective:   Beth Hudson is a 76 year old female patient who presents today for: Patient presents with:  Follow Up  ER F/U      #Back pain  - Has been an issue on and off for several years  - Low back pain with radiation down leg  - Went to ED twice, normal back and hip XR, LLE [place]  - Pain minimally improved with flexaril, prednisone burst  - Triggered by helping daughter in and out of bed after knee surgery  - Walking okay, walked to clinic today  - No loss of bowel or bladder control    #Positive FIT test  - someone from [place] [facility] came to her house and gave her a stool test  - Came back positive  - Records not in system      No interpreter was required during this encounter.     ROS:  Review of systems conducted and pertinent review of systems as noted in HPI.    Patient Active Problem List:     Unspecified hypothyroidism     Epistaxis     Carpal tunnel syndrome     Disorder of bone and cartilage, unspecified     Family health problem     Second hand smoke exposure     Lichen planus      [facility] [phone] Option #4     Colon polyps, adenomatous     Angiodysplasia of cecum     Acute pain of both shoulders     Arthritis     Carotid artery stenosis     Cataract     Encounter for screening for malignant neoplasm of colon     Stress incontinence of urine     Hyperglycemia     Hyperlipidemia     Right facial pain     Syncope     Incontinence of feces     Elevated BP without diagnosis of hypertension      MEDS:    Current Outpatient Medications:     levothyroxine (SYNTHROID) 75 MCG tablet, Take 1 tablet by mouth in the morning, Disp: 90 tablet, Rfl: 3    atorvastatin (LIPITOR) 40 MG tablet, Take 1 tablet by mouth in the morning., Disp: 90 tablet, Rfl: 3    aspirin 81 MG EC tablet, Take 1 tablet by mouth in the morning., Disp: 90 tablet, Rfl: 3    latanoprost (XALATAN) 0.005 % ophthalmic solution, INSTILL 1 DROP INTO BOTH EYES AT  BEDTIME, Disp: , Rfl:     Review of Patient's Allergies indicates:   Strawberry              Rash    Comment:Unable to eat fresh strawberries, ok with the             restUnable to eat fresh strawberries, ok with the             restUnable to eat fresh strawberries, ok with the             restUnable to eat fresh strawberries, ok with the             restUnable to eat fresh strawberries, ok with the             restUnable to eat fresh strawberries, ok with the             rest    Objective:  BP (!) 152/76 (Site: LA, Position: Sitting, Cuff Size: Reg)   Pulse 83   Temp 97.8 F (36.6 C) (Temperature probe)   Wt 57.2 kg (126 lb)   SpO2 99%   BMI 19.73 kg/m   General: No acute distress, well appearing.  HEENT:  PERRL, EOMI, sclera non-injected.  Back: No deformity or TTP over spine or sacrum. Muscle spasm of L paraspinal muscles in lumbar region appreciated. Positive straight leg test.   Ext: Warm, well-perfused. No lower extremity edema bilaterally.  Neuro: AOx3. CN grossly intact. Gait normal.     Assessment & Plan:    Beth Hudson is a 76 year old female who presented to clinic today for Patient presents with:  Follow Up  ER F/U       1. Chronic left-sided low back pain with left-sided sciatica  2. Muscle spasm of back  Patient with chronic LBP with sciatica triggered by muscle spasm in the setting of lifting her daughter in and out of bed. [facility] from [facility] showed no bony abnormality or DVT. Discussed supportive care at home and will continue flexaril as needed. For more long term management of sciatic pain, patient is agreeable to PT referral.   - REFERRAL TO PHYSICAL THERAPY (INT)  - cyclobenzaprine (FLEXERIL) 10 MG tablet; Take 1 tablet by mouth 3 (three) times daily as needed  for up to 14 days  Dispense: 30 tablet; Refill: 1    3. Other specified hypothyroidism  Routine check, has not been taking med for last 2 weeks due to being unsure if she could take it with flexaril  - TSH (THYROID  STIMULATING HORMONE)    4. Abnormal stool test  Ext FIT test abnormal, will f/u with colonoscopy  - COLONOSCOPY; Future        I have reviewed the past medical, family, and social history sections including the medications and allergies listed in the above medical record. I reconciled the patient's medication list and prepared and supplied needed refills. The patient indicates understanding of the above issues and agrees with the plan. The patient has been given an After Visit Summary sheet that lists all of their medications with directions, their allergies, orders placed during this encounter, immunization dates, and follow- up instructions.    Discussed with Dr. Caprice Red.     Johny Shock, MD, 10/05/2022

## 2022-10-07 ENCOUNTER — Encounter (HOSPITAL_BASED_OUTPATIENT_CLINIC_OR_DEPARTMENT_OTHER): Payer: Self-pay

## 2022-10-07 ENCOUNTER — Telehealth (HOSPITAL_BASED_OUTPATIENT_CLINIC_OR_DEPARTMENT_OTHER): Payer: Self-pay

## 2022-10-07 NOTE — Progress Notes (Signed)
Called Pt to schedule a colonoscopy.  Left message asking Pt to return a call.

## 2022-10-07 NOTE — Progress Notes (Signed)
RN ASSESSMENT COMPLETE

## 2022-10-07 NOTE — Progress Notes (Signed)
Preceptor Note  I personally reviewed this case, along with the patient's history and exam findings, with Dr. Marbas. I confirm the findings, and agree with the assessment and plan, as documented in the visit note.    Dashun Borre, DO

## 2022-10-10 ENCOUNTER — Telehealth (HOSPITAL_BASED_OUTPATIENT_CLINIC_OR_DEPARTMENT_OTHER): Payer: Self-pay

## 2022-10-10 NOTE — Progress Notes (Signed)
Called Pt to schedule a colonoscopy.  Left message asking Pt to return a call.

## 2022-10-21 ENCOUNTER — Emergency Department: Admit: 2022-10-22 | Payer: MEDICARE

## 2022-10-21 ENCOUNTER — Telehealth (HOSPITAL_BASED_OUTPATIENT_CLINIC_OR_DEPARTMENT_OTHER): Payer: Self-pay

## 2022-10-21 DIAGNOSIS — G8929 Other chronic pain: Secondary | ICD-10-CM

## 2022-10-21 DIAGNOSIS — N3001 Acute cystitis with hematuria: Secondary | ICD-10-CM

## 2022-10-21 DIAGNOSIS — R112 Nausea with vomiting, unspecified: Secondary | ICD-10-CM

## 2022-10-21 MED ORDER — DULOXETINE HCL 30 MG PO CPEP
ORAL_CAPSULE | ORAL | 0 refills | Status: DC
Start: 2022-10-21 — End: 2022-11-04

## 2022-10-21 MED ORDER — ketorolac (Toradol) injection 15 mg
15 | Freq: Once | INTRAMUSCULAR | Status: AC
Start: 2022-10-21 — End: 2022-10-21
  Administered 2022-10-22: 03:00:00 15 mg via INTRAVENOUS

## 2022-10-21 MED ORDER — sodium chloride 0.9 % bolus 1,000 mL
0.9 | Freq: Once | INTRAVENOUS | Status: AC
Start: 2022-10-21 — End: 2022-10-21
  Administered 2022-10-22: 01:00:00 1000 mL via INTRAVENOUS

## 2022-10-21 MED ORDER — sodium chloride 0.9 % flush 10 mL
INTRAMUSCULAR | Status: DC | PRN
Start: 2022-10-21 — End: 2022-10-23

## 2022-10-21 MED ORDER — iohexol (OMNIPaque) 350 mg iodine/mL solution 100 mL
350 | Freq: Once | INTRAVENOUS | Status: AC
Start: 2022-10-21 — End: 2022-10-21
  Administered 2022-10-22: 02:00:00 100 mL via INTRAVENOUS

## 2022-10-21 NOTE — ED Notes (Signed)
Dr Iona Hansen at bedside     Kathrin Ruddy, RN  10/21/22 2005

## 2022-10-21 NOTE — ED Progress Note (Signed)
MEDICAL SCREENING EXAM - PROVIDER IN TRIAGE    Brief HPI: 76 year old female presents due to feeling lightheaded and nauseous.  Believes this is secondary to being started on Cymbalta as she took her last dose earlier this afternoon prior to symptom onset.  Reports history of chronic left low back pain with sciatica and has been worked up extensively for this.  No new symptoms.  No loss of bowel or bladder.  No urinary retention.  No saddle anesthesia.    Exam:  Constitutional: Vital signs reviewed. Well-nourished.  Respiratory: Nonlabored. Speaking in full clear sentences.   Skin: Normal color. No rash.  Neuro: Alert. Normal gross motor.  Psych. Normal affect    Tests ordered:   Orders Placed This Encounter   Procedures   . XR CHEST 2 VIEWS   . CBC and differential   . Comprehensive metabolic panel   . Troponin I, High Sensitivity   . CBC w/ Differential   . Cardiac Monitoring   . Continuous Pulse Oximetry   . ECG 12 lead   . Insert peripheral IV   . Saline lock IV       Disposition:  Main ED.    Observation Status: ED lightheaded/leg pain obs for cardiac workup, reeval.    Patient was advised not to leave the emergency department until further evaluation and work-up is complete.     Gertie FeyBrett Raynor Calcaterra, [place]PA  10/21/22 1949

## 2022-10-21 NOTE — ED Notes (Signed)
Dr Imogene Burn at bedside     Kathrin Ruddy, RN  10/21/22 [phone]

## 2022-10-21 NOTE — ED Provider Notes (Signed)
HPI   Chief Complaint   Patient presents with   . Back Pain   . Nausea       Patient presenting for complaint of adverse reaction to medication.  Patient has been dealing with sciatica on the left side since earlier this month.  Has been on muscle relaxers and is on prednisone.  Reports no relief with muscle relaxer and the PCP prescribed her Cymbalta.  She took the first dose today then felt nauseated and dizzy and vomited.  Denies any headache or vision changes numbness or tingling.  She is still reporting left-sided back pain rating down the leg.  No new incontinence retention.  She does report some lower abdominal discomfort.  Reports normal bowel movements.      History provided by:  Patient                Glasgow Coma Scale Score: 15                                  Patient History   No past medical history on file.  No past surgical history on file.  Family History   Problem Relation Name Age of Onset   . Cancer Brother     . Diabetes Nephew       Social History     Tobacco Use   . Smoking status: Former     Types: Cigarettes   . Smokeless tobacco: Never   Substance Use Topics   . Alcohol use: Never   . Drug use: Never       Review of Systems   Review of Systems    Physical Exam   ED Triage Vitals   Temp Pulse Resp BP   10/21/22 1943 10/21/22 1928 10/21/22 1928 10/21/22 1928   36.7 C (98.1 F) 94 18 (!) 141/72      SpO2 Temp Source Heart Rate Source Patient Position   10/21/22 1928 10/21/22 1928 -- --   96 % Temporal        BP Location FiO2 (%)     -- --             Physical Exam  Vitals and nursing note reviewed.   Constitutional:       General: She is not in acute distress.     Appearance: She is well-developed.   HENT:      Head: Normocephalic and atraumatic.   Eyes:      Conjunctiva/sclera: Conjunctivae normal.   Cardiovascular:      Rate and Rhythm: Normal rate and regular rhythm.      Heart sounds: No murmur heard.  Pulmonary:      Effort: Pulmonary effort is normal. No respiratory distress.       Breath sounds: Normal breath sounds.   Abdominal:      Palpations: Abdomen is soft.      Tenderness: There is abdominal tenderness (Suprapubic region.  No rebound or guarding.).   Musculoskeletal:         General: No swelling.      Cervical back: Neck supple.   Skin:     General: Skin is warm and dry.      Capillary Refill: Capillary refill takes less than 2 seconds.   Neurological:      General: No focal deficit present.      Mental Status: She is alert and oriented to person, place, and time.  CT ABDOMEN PELVIS W CONTRAST   Final Result   1. Constipation.   2. Small hiatal hernia.   3. Cholelithiasis.   4. Atherosclerosis.Alvie Heidelberg Miseljic Dr 10/21/2022 9:34 PM          Labs Reviewed   COMPREHENSIVE METABOLIC PANEL - Abnormal       Result Value    Sodium 138      Potassium 3.6      Chloride 106      CO2 (Bicarbonate) 25      Anion Gap 7      BUN 18      Creatinine 0.76      eGFRcr 82      Glucose 149 (*)     Fasting? Unknown      Calcium 9.2      AST 28      ALT 67 (*)     Alkaline phosphatase 224 (*)     Protein, total 7.3      Albumin 3.7      Bilirubin, total 0.8     CBC WITH DIFFERENTIAL - Abnormal    WBC 16.0 (*)     RBC 3.76      Hemoglobin 10.7 (*)     Hematocrit 31.9 (*)     MCV 84.8      MCH 28.5      MCHC 33.5      RDW-CV 13.5      RDW-SD 41.8      Platelets 322      MPV 8.7 (*)     Neutrophil % 85.3      Lymphocyte % 8.5      Monocytes % 5.2      Eosinophils % 0.4      Basophils % 0.2      Immature Granulocytes % 0.4      NRBC % 0.0      Neutrophils Absolute 13.67 (*)     Lymphocytes Absolute 1.36      Monocytes Absolute 0.83 (*)     Eosinophils Absolute 0.06      Basophils Absolute 0.04      Immature Granulocytes Absolute 0.07      NRBC Absolute 0.00     TROPONIN I, HIGH SENSITIVITY - Normal    Troponin I, High Sensitivity 6      Narrative:     Clinical correlation, HEART Score and shared decision making must be taken into account.                   For Chest Pain >3 Hours    Rule-Out  Criteria              Single Value     0hr/1hr Delta Value  Female  <10ng/L*          <54ng/L AND delta <15ng/L  Female    <10ng/L*          <79ng/L AND delta <15ng/L    Cannot Rule-Out                            0hr/1hr Delta Value  Female    <54ng/L AND delta >15ng/L    >/= 54 AND delta </=15ng/L  Female      <79ng/L AND delta >15ng/L    >/= 79 AND delta </=15ng/L    Rule-In Criteria  Single Value   0hr/1hr Delta Value                 Female   >115ng/L       >/=54ng/L AND delta >/=15ng/L         Female     >115ng/L       >/=79ng/L AND delta >/=15ng/L           *Note: for Chest pain <3 hours 0hr/1hr is warranted for evaluation.     CBC W/DIFF    Narrative:     The following orders were created for panel order CBC and differential.  Procedure                               Abnormality         Status                     ---------                               -----------         ------                     CBC w/ Differential[[id number]]          Abnormal            Final result                 Please view results for these tests on the individual orders.   URINALYSIS REFLEX TO CULTURE       ED Course & MDM   Diagnoses as of 10/21/22 2256   Nausea and vomiting, unspecified vomiting type   Non-dose-related adverse reaction to medication, initial encounter   Sciatica of left side       Medical Decision Making  History obtained from: patient  Chronic conditions affecting care: Sciatica  Differential diagnosis: Medication reaction  Review of non-ED record: PCP note  I performed an independent interpretation of labs/tests/imaging:   IV established labs were obtained.  Patient treated with IV fluids.  Lab test showed an elevated white count of 16, unclear if this is related to her prednisone or other issue going on.  Hemoglobin is decreased at 10.7.  Troponin is within normal limits.  Chemistry panel shows an elevated glucose of 149, ALT 67 and alk phos 224 otherwise within normal limits.  CT abdomen pelvis was  obtained given the reported pain and elevated white count.  This demonstrates constipation, small hiatal hernia, cholelithiasis without cholecystitis.  Kidney and bladder unremarkable.  Patient treated with a dose of Toradol.  Patient was able to get up and ambulate steadily for short distances.  Family reports they do not feel comfortable with her at home and would request PT case management evaluation. UA is still pending at this time    Discussion with other providers: none    Tests and medications considered but not ordered/prescribed: none    Consideration of admission: pending results of UA    Care affected by Social Detriments of Health: none      Amount and/or Complexity of Data Reviewed  Radiology: ordered.  ECG/medicine tests: independent interpretation performed.      Risk  Prescription drug management.                Berneta Levins, MD  10/21/22 2256

## 2022-10-21 NOTE — ED Procedure Note (Signed)
Procedure  ECG 12 lead    Performed by: Berneta Levins, MD  Authorized by: Gertie Fey, PA    ECG reviewed by ED Physician in the absence of a cardiologist: yes    Rate:     ECG rate:  87    ECG rate assessment: normal    Rhythm:     Rhythm: sinus rhythm    Ectopy:     Ectopy: none    QRS:     QRS axis:  Normal  ST segments:     ST segments:  Normal  T waves:     T waves: normal    Other findings:     Other findings: prolonged qTc interval                   Berneta Levins, MD  10/21/22 2222

## 2022-10-21 NOTE — ED Notes (Signed)
Pt a&o x3 and reports taking Cymbalta earlier for the first time for her sciatica. Pt states she got very nauseous and weak after taking medication. Pt reports vomiting x3 and abd pain. Pt denies diarrhea at this time. Pt also reports back pain that radiates down left leg. Pt resting on stretcher awaiting provider at this time.      Kathrin Ruddy, RN  10/21/22 2005

## 2022-10-21 NOTE — ED Notes (Signed)
Pt repositioned for comfort. Pt reports pain currently 6/10 from back down left leg.     Kathrin Ruddy, RN  10/21/22 [phone]

## 2022-10-21 NOTE — Progress Notes (Signed)
Care Management Note:    Pt is a 76 year old woman who presents to this ED for the third time with sciatic pain which she's had for "years." Per "Care Everywhere" pt has also been to [facility] since last visit here, and Home PT was also ordered for pt. Work up in progress. CM will continue to follow.     Richrd Sox, RN  7:49 PM

## 2022-10-21 NOTE — ED Triage Notes (Signed)
PT coming from home.  Pt reports left sided hip and left sided back pain.  However, per family,Pt took cymbalta for the first time today and developed nausea.

## 2022-10-21 NOTE — ED Notes (Signed)
Pt assisted to bathroom in WC. Pt reports ambulation is difficult due to back and Left leg pain at this time. Pt was able to ambulate short distances with steady gait. Pt urine sample collected and sent to lab.      Kathrin Ruddy, RN  10/21/22 2154

## 2022-10-21 NOTE — ED Notes (Signed)
Pt back from CT     Kathrin Ruddy, RN  10/21/22 2115

## 2022-10-21 NOTE — ED Notes (Signed)
EKG complete and signed by MD     Kathrin RuddyShannon Justan Gaede, RN  10/21/22 2013

## 2022-10-21 NOTE — ED Notes (Signed)
Pt will remain in ED for night for PT eval and case management consult in morning.      Kathrin RuddyShannon Quinton Voth, RN  10/21/22 2358

## 2022-10-21 NOTE — ED Notes (Signed)
Pt to CT     Kathrin Ruddy, RN  10/21/22 2053

## 2022-10-21 NOTE — Telephone Encounter (Signed)
Pt states that the Flexeril is not being helpful for her back pain and the pain radiates to her left leg. She run out of the Flexeril and Tylenol. She would like to have a stronger medication prescribed.

## 2022-10-21 NOTE — Telephone Encounter (Signed)
Beth Hudson [phone], 76 year old, female    Calls today:  Clinical Questions ([place])    Name of person calling Patient  Specific nature of request pt calling requesting med refill for pain meds, thank you  Return phone number [phone]     Person calling on behalf of patient: Patient (self)      Patient's language of care: English  Patient does not need an interpreter.    Patient's PCP: Johny Shock, MD    Primary Care Home Site:  [facility]

## 2022-10-21 NOTE — Telephone Encounter (Signed)
Beth Shock, MD  P [facility] R[facility]  Caller: Unspecified (Today, 10:33 AM)  I sent in Cymbalta for her, which will hopefully be better for the nerve pain. She will take one pill daily for 1 week and then increase to 2 pills daily. We will check in on how she is doing on this medication at our appointment 12/11.    Thanks!  [name] and spoke with patient.     Relayed provider's message with patient and reviewed medication administration instructions. Patient stated understanding. Confirmed appointment with Dr. Eloise Levels on 12/11 at 1400.

## 2022-10-22 ENCOUNTER — Observation Stay
Admission: EM | Admit: 2022-10-22 | Discharge: 2022-10-23 | Disposition: A | Discharge status: Home Health | Payer: MEDICARE

## 2022-10-22 ENCOUNTER — Inpatient Hospital Stay: Admit: 2022-10-22 | Payer: MEDICARE

## 2022-10-22 DIAGNOSIS — R112 Nausea with vomiting, unspecified: Secondary | ICD-10-CM

## 2022-10-22 LAB — CBC WITH DIFFERENTIAL
Basophils %: 0.2 %
Basophils Absolute: 0.04 10*3/uL (ref 0.00–0.22)
Eosinophils %: 0.4 %
Eosinophils Absolute: 0.06 10*3/uL (ref 0.00–0.50)
Hematocrit: 31.9 % — ABNORMAL LOW (ref 32.0–47.0)
Hemoglobin: 10.7 g/dL — ABNORMAL LOW (ref 11.0–16.0)
Immature Granulocytes %: 0.4 %
Immature Granulocytes Absolute: 0.07 10*3/uL (ref 0.00–0.10)
Lymphocyte %: 8.5 %
Lymphocytes Absolute: 1.36 10*3/uL (ref 0.70–4.00)
MCH: 28.5 pg (ref 26.0–34.0)
MCHC: 33.5 g/dL (ref 31.0–37.0)
MCV: 84.8 fL (ref 80.0–100.0)
MPV: 8.7 fL — ABNORMAL LOW (ref 9.1–12.4)
Monocytes %: 5.2 %
Monocytes Absolute: 0.83 10*3/uL — ABNORMAL HIGH (ref 0.36–0.77)
NRBC %: 0 % (ref 0.0–0.0)
NRBC Absolute: 0 10*3/uL (ref 0.00–2.00)
Neutrophil %: 85.3 %
Neutrophils Absolute: 13.67 10*3/uL — ABNORMAL HIGH (ref 1.50–7.95)
Platelets: 322 10*3/uL (ref 150–400)
RBC: 3.76 M/uL (ref 3.70–5.20)
RDW-CV: 13.5 % (ref 11.5–14.5)
RDW-SD: 41.8 fL (ref 35.0–51.0)
WBC: 16 10*3/uL — ABNORMAL HIGH (ref 4.0–11.0)

## 2022-10-22 LAB — URINALYSIS REFLEX TO CULTURE
Bilirubin, Ur: NEGATIVE
Casts, Ur: 1 /LPF (ref 0–4)
Epithelial Cells, UR: 0 {cells}/[HPF] (ref 0–5)
Glucose,Ur: NEGATIVE mg/dL
Ketones, Ur: NEGATIVE mg/dL
Nitrite, Ur: NEGATIVE
RBC, Ur: 2 {cells}/[HPF] (ref 0–4)
Specific Gravity, Ur: 1.015 (ref 1.005–1.030)
Urobilinogen, Ur: 0.2 U/dL (ref 0.2–1.0)
WBC, Ur: 105 {cells}/[HPF] — ABNORMAL HIGH (ref 0–5)
pH, Ur: 5 (ref 5.0–8.0)

## 2022-10-22 LAB — CBC
Hematocrit: 29.7 % — ABNORMAL LOW (ref 32.0–47.0)
Hemoglobin: 9.9 g/dL — ABNORMAL LOW (ref 11.0–16.0)
MCH: 28.6 pg (ref 26.0–34.0)
MCHC: 33.3 g/dL (ref 31.0–37.0)
MCV: 85.8 fL (ref 80.0–100.0)
MPV: 9.1 fL (ref 9.1–12.4)
NRBC %: 0 % (ref 0.0–0.0)
NRBC Absolute: 0 10*3/uL (ref 0.00–2.00)
Platelets: 303 10*3/uL (ref 150–400)
RBC: 3.46 M/uL — ABNORMAL LOW (ref 3.70–5.20)
RDW-CV: 13.5 % (ref 11.5–14.5)
RDW-SD: 42.1 fL (ref 35.0–51.0)
WBC: 12.6 10*3/uL — ABNORMAL HIGH (ref 4.0–11.0)

## 2022-10-22 LAB — COMPREHENSIVE METABOLIC PANEL
ALT: 67 U/L — ABNORMAL HIGH (ref 0–55)
AST: 28 U/L (ref 6–42)
Albumin: 3.7 g/dL (ref 3.2–5.0)
Alkaline phosphatase: 224 U/L — ABNORMAL HIGH (ref 30–130)
Anion Gap: 7 mmol/L (ref 3–14)
BUN: 18 mg/dL (ref 6–24)
Bilirubin, total: 0.8 mg/dL (ref 0.2–1.2)
CO2 (Bicarbonate): 25 mmol/L (ref 20–32)
Calcium: 9.2 mg/dL (ref 8.5–10.5)
Chloride: 106 mmol/L (ref 98–110)
Creatinine: 0.76 mg/dL (ref 0.55–1.30)
Glucose: 149 mg/dL — ABNORMAL HIGH (ref 70–139)
Potassium: 3.6 mmol/L (ref 3.6–5.2)
Protein, total: 7.3 g/dL (ref 6.0–8.4)
Sodium: 138 mmol/L (ref 135–146)
eGFRcr: 82 mL/min/{1.73_m2} (ref >=60)

## 2022-10-22 LAB — BASIC METABOLIC PANEL
Anion Gap: 6 mmol/L (ref 3–14)
BUN: 17 mg/dL (ref 6–24)
CO2 (Bicarbonate): 25 mmol/L (ref 20–32)
Calcium: 8.7 mg/dL (ref 8.5–10.5)
Chloride: 110 mmol/L (ref 98–110)
Creatinine: 0.68 mg/dL (ref 0.55–1.30)
Glucose: 108 mg/dL — ABNORMAL HIGH (ref 70–99)
Potassium: 3.5 mmol/L — ABNORMAL LOW (ref 3.6–5.2)
Sodium: 141 mmol/L (ref 135–146)
eGFRcr: 91 mL/min/{1.73_m2} (ref >=60)

## 2022-10-22 LAB — TROPONIN I, HIGH SENSITIVITY: Troponin I, High Sensitivity: 6 ng/L

## 2022-10-22 LAB — LACTIC ACID: Lactic acid: 0.9 mmol/L (ref 0.4–2.0)

## 2022-10-22 MED ORDER — prochlorperazine (Compazine) tablet 10 mg
5 | Freq: Four times a day (QID) | ORAL | Status: DC | PRN
Start: 2022-10-22 — End: 2022-10-23

## 2022-10-22 MED ORDER — cyclobenzaprine (Flexeril) tablet 5 mg
5 | Freq: Three times a day (TID) | ORAL | Status: DC
Start: 2022-10-22 — End: 2022-10-23
  Administered 2022-10-22 – 2022-10-23 (×5): 5 mg via ORAL

## 2022-10-22 MED ORDER — cefTRIAXone (Rocephin) 1 g in sodium chloride 0.9 % 100 mL IVPB-MBP
1 | Freq: Once | INTRAVENOUS | Status: AC
Start: 2022-10-22 — End: 2022-10-22
  Administered 2022-10-22: 05:00:00 1 g via INTRAVENOUS

## 2022-10-22 MED ORDER — aspirin EC tablet 81 mg
81 | Freq: Every day | ORAL | Status: DC
Start: 2022-10-22 — End: 2022-10-23
  Administered 2022-10-22 – 2022-10-23 (×2): 81 mg via ORAL

## 2022-10-22 MED ORDER — acetaminophen (Tylenol) tablet 650 mg
325 | Freq: Four times a day (QID) | ORAL | Status: DC | PRN
Start: 2022-10-22 — End: 2022-10-22

## 2022-10-22 MED ORDER — ketorolac (Toradol) injection 15 mg
15 | Freq: Four times a day (QID) | INTRAMUSCULAR | Status: DC | PRN
Start: 2022-10-22 — End: 2022-10-23

## 2022-10-22 MED ORDER — sennosides (Senokot) tablet 8.6 mg
8.6 | Freq: Two times a day (BID) | ORAL | Status: DC
Start: 2022-10-22 — End: 2022-10-23
  Administered 2022-10-22 – 2022-10-23 (×3): 8.6 via ORAL

## 2022-10-22 MED ORDER — acetaminophen (Tylenol) solution 650 mg
650 | Freq: Four times a day (QID) | ORAL | Status: DC | PRN
Start: 2022-10-22 — End: 2022-10-22

## 2022-10-22 MED ORDER — acetaminophen (Tylenol) suppository 650 mg
650 | Freq: Four times a day (QID) | RECTAL | Status: DC | PRN
Start: 2022-10-22 — End: 2022-10-23

## 2022-10-22 MED ORDER — melatonin tablet 6 mg
3 | Freq: Every evening | ORAL | Status: DC | PRN
Start: 2022-10-22 — End: 2022-10-23

## 2022-10-22 MED ORDER — cefTRIAXone (Rocephin) 1 g in sodium chloride 0.9 % 100 mL IVPB-MBP
1 | INTRAMUSCULAR | Status: DC
Start: 2022-10-22 — End: 2022-10-23
  Administered 2022-10-23: 06:00:00 1 g via INTRAVENOUS

## 2022-10-22 MED ORDER — levothyroxine (Synthroid, Levoxyl) tablet 75 mcg
75 | Freq: Every day | ORAL | Status: DC
Start: 2022-10-22 — End: 2022-10-23
  Administered 2022-10-22 – 2022-10-23 (×2): 75 ug via ORAL

## 2022-10-22 MED ORDER — morphine injection 2 mg
2 | INTRAMUSCULAR | Status: DC | PRN
Start: 2022-10-22 — End: 2022-10-23
  Administered 2022-10-22 – 2022-10-23 (×2): 2 mg via INTRAVENOUS

## 2022-10-22 MED ORDER — acetaminophen (Tylenol) tablet 650 mg
325 | Freq: Four times a day (QID) | ORAL | Status: DC | PRN
Start: 2022-10-22 — End: 2022-10-23

## 2022-10-22 MED ORDER — heparin (porcine) injection 5,000 Units
5000 | Freq: Two times a day (BID) | INTRAMUSCULAR | Status: DC
Start: 2022-10-22 — End: 2022-10-23

## 2022-10-22 MED ORDER — atorvastatin (Lipitor) tablet 40 mg
40 | Freq: Every day | ORAL | Status: DC
Start: 2022-10-22 — End: 2022-10-23
  Administered 2022-10-22 – 2022-10-23 (×2): 40 mg via ORAL

## 2022-10-22 MED ORDER — prochlorperazine (Compazine) injection 10 mg
10 | Freq: Four times a day (QID) | INTRAMUSCULAR | Status: DC | PRN
Start: 2022-10-22 — End: 2022-10-23

## 2022-10-22 MED ORDER — acetaminophen (Tylenol) solution 650 mg
650 | Freq: Four times a day (QID) | ORAL | Status: DC | PRN
Start: 2022-10-22 — End: 2022-10-23

## 2022-10-22 MED ORDER — polyethylene glycol (Glycolax) powder 17 g
17 | Freq: Every day | ORAL | Status: DC
Start: 2022-10-22 — End: 2022-10-22

## 2022-10-22 MED ORDER — acetaminophen (Tylenol) suppository 650 mg
650 | Freq: Four times a day (QID) | RECTAL | Status: DC | PRN
Start: 2022-10-22 — End: 2022-10-22

## 2022-10-22 MED ORDER — docusate sodium (Colace) capsule 100 mg
100 | Freq: Two times a day (BID) | ORAL | Status: DC | PRN
Start: 2022-10-22 — End: 2022-10-23

## 2022-10-22 MED ORDER — naloxone (Narcan) injection 0.1 mg
0.4 | INTRAMUSCULAR | Status: DC | PRN
Start: 2022-10-22 — End: 2022-10-23

## 2022-10-22 MED ORDER — polyethylene glycol (Glycolax) packet 17 g
17 | Freq: Every day | ORAL | Status: DC
Start: 2022-10-22 — End: 2022-10-23
  Administered 2022-10-22 – 2022-10-23 (×2): 17 g via ORAL

## 2022-10-22 MED ORDER — prochlorperazine (Compazine) suppository 25 mg
25 | Freq: Two times a day (BID) | RECTAL | Status: DC | PRN
Start: 2022-10-22 — End: 2022-10-23

## 2022-10-22 MED ORDER — docusate sodium (Colace) oral liquid 100 mg
50 | Freq: Two times a day (BID) | ORAL | Status: DC | PRN
Start: 2022-10-22 — End: 2022-10-23

## 2022-10-22 MED FILL — MORPHINE 2 MG/ML INJECTION WRAPPER: 2 2 mg/mL | INTRAMUSCULAR | Qty: 1

## 2022-10-22 MED FILL — PROCHLORPERAZINE MALEATE 5 MG TABLET: 5 5 mg | ORAL | Qty: 2

## 2022-10-22 MED FILL — POLYETHYLENE GLYCOL 3350 17 GRAM ORAL POWDER PACKET: 17 17 gram | ORAL | Qty: 1

## 2022-10-22 MED FILL — CEFTRIAXONE IVPB 1 G MINI-BAG PLUS IN 100 ML NS: 1.0000 1.0000 g | Qty: 1

## 2022-10-22 MED FILL — LEVOTHYROXINE 75 MCG TABLET: 75 75 mcg | ORAL | Qty: 1

## 2022-10-22 MED FILL — PROCHLORPERAZINE 25 MG RECTAL SUPPOSITORY: 25 25 mg | RECTAL | Qty: 1

## 2022-10-22 MED FILL — KETOROLAC 15 MG/ML INJECTION SOLUTION: 15 15 mg/mL | INTRAMUSCULAR | Qty: 1

## 2022-10-22 MED FILL — CYCLOBENZAPRINE 5 MG TABLET: 5 5 mg | ORAL | Qty: 1

## 2022-10-22 MED FILL — SENNOSIDES 8.6 MG TABLET: 8.6 8.6 mg | ORAL | Qty: 1

## 2022-10-22 MED FILL — POLYETHYLENE GLYCOL 3350 17 GRAM/DOSE ORAL POWDER: 17 17 gram/dose | ORAL | Qty: 238

## 2022-10-22 MED FILL — ASPIRIN 81 MG TABLET,DELAYED RELEASE: 81 81 mg | ORAL | Qty: 1

## 2022-10-22 MED FILL — ATORVASTATIN 40 MG TABLET: 40 40 mg | ORAL | Qty: 1
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
## 2022-10-22 NOTE — ED Notes (Signed)
Pt sleeping, +rise/fall of chest with position changes noted       Kathrin RuddyShannon Feige Lowdermilk, RN  10/22/22 [phone]

## 2022-10-22 NOTE — ED Notes (Signed)
Assumed care of patient at this time. Pt resting on stretcher PWD in no acute distress. Endorses some discomfort in L leg. Denies CP, SOB, dizziness, abdominal pain or nausea. Pt provided breakfast tray. Denies needs. Awaiting bed assignment.      Benito Mccreedy, RN  10/22/22 [phone]

## 2022-10-22 NOTE — ED Notes (Signed)
Pt sleeping, +rise/fall of chest with position changes noted       Kathrin Ruddy, RN  10/22/22 [phone]

## 2022-10-22 NOTE — ED Notes (Signed)
Pt breakfast menu filled out and faxed to kitchen     Kathrin RuddyShannon Tovah Slavick, RN  10/22/22 [phone]

## 2022-10-22 NOTE — Progress Notes (Signed)
Pharmacy - Admission Medication History and Reconciliation     I reviewed the available medical records, studied information provided by DrFirst / Surescripts and interviewed the patient to conduct a medication history. I reviewed and updated as necessary, the patient's Allergies and Home Medication List. I communicated significant findings directly with a member of the primary team. I allowed the patient to ask questions about their medication therapy and provided answers to their questions.     Findings:  My findings are detailed in the Table at the end of this note.    Recommendations:  N/a      Thank you for allowing me to participate in the care of this patient. Please contact me or any member of the pharmacist staff if you have questions.     Larose Hires, PharmD  Available on TigerConnect      Preferred Pharmacy: (updated by pharmacy for this admission)    [facility] [phone] [facility] [place] - [address] AT [facility] [address]  [address]  [place] [address]  Phone: [phone] Fax: [phone]    [id number] [place], [place] - [address]  [address]  [place] [address]  Phone: [phone] Fax: [phone]      Medications Prior to Admission   Medication Sig Dispense Refill Findings/Recommendations    aspirin 81 mg EC tablet Take 81 mg by mouth in the morning.        atorvastatin (Lipitor) 40 mg tablet Take 40 mg by mouth once daily.        cyclobenzaprine (Flexeril) 10 mg tablet Take 10 mg by mouth if needed in the morning, at noon, and at bedtime for muscle spasms.        levothyroxine (Synthroid, Levoxyl) 75 mcg tablet Take 75 mcg by mouth once daily.

## 2022-10-22 NOTE — Consults (Signed)
Pt is a 76 y/o female admitted for nausea and vomiting. She currently lives in an apartment with both her son and daughter. 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 Pt AOx4, friendly demeanor, made appropriate eye contact, speech pace/rate/volume WNL.    Pt requested SW consult to gather information on lifeline support in case of her falling again. TW provided informational brochure and reviewed steps with her. Pt relayed she had no questions at this time.    Pt has no further SW needs.

## 2022-10-22 NOTE — Care Plan (Signed)
Goal Outcome Evaluation:     Problem: Adult Inpatient Plan of Care  Goal: Plan of Care Review  Outcome: Ongoing, Progressing  Goal: Patient-Specific Goal (Individualized)  Outcome: Ongoing, Progressing  Goal: Absence of Hospital-Acquired Illness or Injury  Outcome: Ongoing, Progressing  Goal: Optimal Comfort and Wellbeing  Outcome: Ongoing, Progressing  Intervention: Monitor Pain and Promote Comfort  Recent Flowsheet Documentation  Taken 10/22/2022 0826 by Shearon Stalls, RN  Pain Management Interventions:   around-the-clock dosing utilized   care clustered   cold applied   heat applied   medication offered   pillow support provided   position adjusted   premedicated for activity   quiet environment facilitated   relaxation techniques promoted   warm blanket provided  Goal: Readiness for Transition of Care  Outcome: Ongoing, Progressing

## 2022-10-22 NOTE — Nursing Note (Signed)
A&Ox4, VSS, c/o pain 9/10 in LT leg, pt does not want meds at this time, home meds brought to pharmacy [phone], pt refused bed alarm, up ad lib at this time. Pain well controled see MAr, pt resting, call light in reach, bed alarm refused by pt, up ad lib, steady gait on shift.

## 2022-10-22 NOTE — ED Notes (Signed)
Pt's morning labs collected and sent.      Kathrin Ruddy, RN  10/22/22 0522

## 2022-10-22 NOTE — ED Notes (Signed)
Report to Joe RN on Med 4.      Benito Mccreedy, RN  10/22/22 [phone]

## 2022-10-22 NOTE — ED Notes (Addendum)
Pt IV antibiotics complete. IV flushed and locked.        Kathrin Ruddy, RN  10/22/22 [phone]

## 2022-10-22 NOTE — ED Notes (Signed)
Pt assisted to bathroom and back to room in [facility].      Kathrin Ruddy, RN  10/22/22 [phone]

## 2022-10-22 NOTE — ED Notes (Signed)
Pt sleeping, +rise/fall of chest with position changes noted       Kathrin RuddyShannon Sherae Santino, RN  10/22/22 0230

## 2022-10-23 LAB — BASIC METABOLIC PANEL
Anion Gap: 5 mmol/L (ref 3–14)
BUN: 19 mg/dL (ref 6–24)
CO2 (Bicarbonate): 28 mmol/L (ref 20–32)
Calcium: 8.8 mg/dL (ref 8.5–10.5)
Chloride: 108 mmol/L (ref 98–110)
Creatinine: 0.68 mg/dL (ref 0.55–1.30)
Glucose: 102 mg/dL (ref 70–139)
Potassium: 3.5 mmol/L — ABNORMAL LOW (ref 3.6–5.2)
Sodium: 141 mmol/L (ref 135–146)
eGFRcr: 91 mL/min/{1.73_m2} (ref >=60)

## 2022-10-23 LAB — CBC
Hematocrit: 30.6 % — ABNORMAL LOW (ref 32.0–47.0)
Hemoglobin: 10.1 g/dL — ABNORMAL LOW (ref 11.0–16.0)
MCH: 28 pg (ref 26.0–34.0)
MCHC: 33 g/dL (ref 31.0–37.0)
MCV: 84.8 fL (ref 80.0–100.0)
MPV: 9.4 fL (ref 9.1–12.4)
NRBC %: 0 % (ref 0.0–0.0)
NRBC Absolute: 0 10*3/uL (ref 0.00–2.00)
Platelets: 314 10*3/uL (ref 150–400)
RBC: 3.61 M/uL — ABNORMAL LOW (ref 3.70–5.20)
RDW-CV: 13.5 % (ref 11.5–14.5)
RDW-SD: 42 fL (ref 35.0–51.0)
WBC: 8.9 10*3/uL (ref 4.0–11.0)

## 2022-10-23 MED ORDER — potassium chloride CR (Klor-Con M20) ER tablet 40 mEq
20 | Freq: Once | ORAL | Status: AC
Start: 2022-10-23 — End: 2022-10-23
  Administered 2022-10-23: 15:00:00 40 meq via ORAL

## 2022-10-23 MED ORDER — cefpodoxime (Vantin) 200 mg tablet
200 | ORAL_TABLET | Freq: Two times a day (BID) | ORAL | 0 refills | 6.50000 days | Status: AC
Start: 2022-10-23 — End: 2022-10-24

## 2022-10-23 MED ORDER — docusate sodium (Colace) 100 mg capsule
100 | ORAL_CAPSULE | Freq: Two times a day (BID) | ORAL | 0 refills | Status: AC
Start: 2022-10-23 — End: 2022-11-22

## 2022-10-23 MED FILL — POLYETHYLENE GLYCOL 3350 17 GRAM ORAL POWDER PACKET: 17 17 gram | ORAL | Qty: 1

## 2022-10-23 MED FILL — MORPHINE 2 MG/ML INJECTION WRAPPER: 2 2 mg/mL | INTRAMUSCULAR | Qty: 1

## 2022-10-23 MED FILL — LEVOTHYROXINE 75 MCG TABLET: 75 75 mcg | ORAL | Qty: 1

## 2022-10-23 MED FILL — HEPARIN (PORCINE) 5,000 UNIT/ML INJECTION SOLUTION: 5000 5,000 unit/mL | INTRAMUSCULAR | Qty: 1

## 2022-10-23 MED FILL — POTASSIUM CHLORIDE ER 20 MEQ TABLET,EXTENDED RELEASE(PART/CRYST): 20 20 mEq | ORAL | Qty: 2

## 2022-10-23 MED FILL — SENNOSIDES 8.6 MG TABLET: 8.6 8.6 mg | ORAL | Qty: 1

## 2022-10-23 MED FILL — CYCLOBENZAPRINE 5 MG TABLET: 5 5 mg | ORAL | Qty: 1

## 2022-10-23 MED FILL — CEFTRIAXONE IVPB 1 G MINI-BAG PLUS IN 100 ML NS: 1.0000 1.0000 g | Qty: 1

## 2022-10-23 MED FILL — ASPIRIN 81 MG TABLET,DELAYED RELEASE: 81 81 mg | ORAL | Qty: 1

## 2022-10-23 MED FILL — ATORVASTATIN 40 MG TABLET: 40 40 mg | ORAL | Qty: 1
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
## 2022-10-23 NOTE — Care Plan (Signed)
Goal Outcome Evaluation:    Problem: Adult Inpatient Plan of Care  Goal: Plan of Care Review  Outcome: Met  Goal: Patient-Specific Goal (Individualized)  Outcome: Met  Goal: Absence of Hospital-Acquired Illness or Injury  Outcome: Met  Intervention: Identify and Manage Fall Risk  Recent Flowsheet Documentation  Taken 10/23/2022 1000 by Shearon Stalls, RN  Safety Promotion/Fall Prevention:   activity supervised   assistive device/personal items within reach   clutter-free environment maintained   fall prevention program maintained   lighting adjusted   mobility aid in reach   nonskid shoes/slippers when out of bed   room organization consistent   safety round/check completed   toileting scheduled  Intervention: Prevent Skin Injury  Recent Flowsheet Documentation  Taken 10/23/2022 1000 by Shearon Stalls, RN  Body Position: supine  Skin Protection:   adhesive use limited   incontinence pads utilized   protective footwear used   skin sealant/moisture barrier applied   skin-to-device areas padded   skin-to-skin areas padded   transparent dressing maintained   tubing/devices free from skin contact  Intervention: Prevent and Manage VTE (Venous Thromboembolism) Risk  Recent Flowsheet Documentation  Taken 10/23/2022 1000 by Shearon Stalls, RN  Range of Motion: active ROM (range of motion) encouraged  Activity Management:   up ad lib   activity adjusted per tolerance  VTE Prevention/Management: patient refused intervention  Intervention: Prevent Infection  Recent Flowsheet Documentation  Taken 10/23/2022 1000 by Shearon Stalls, RN  Infection Prevention:   hand hygiene promoted   single patient room provided   rest/sleep promoted  Goal: Optimal Comfort and Wellbeing  Outcome: Met  Intervention: Monitor Pain and Promote Comfort  Recent Flowsheet Documentation  Taken 10/23/2022 1000 by Shearon Stalls, RN  Pain Management Interventions:   care clustered   medication offered   pillow support provided   position adjusted   quiet  environment facilitated   relaxation techniques promoted   warm blanket provided  Taken 10/23/2022 0700 by Shearon Stalls, RN  Pain Management Interventions:   care clustered   around-the-clock dosing utilized   medication offered   pillow support provided   position adjusted   relaxation techniques promoted   warm blanket provided   quiet environment facilitated  Intervention: Provide Person-Centered Care  Recent Flowsheet Documentation  Taken 10/23/2022 1000 by Shearon Stalls, RN  Trust Relationship/Rapport:   care explained   choices provided   emotional support provided   empathic listening provided   questions answered   questions encouraged   reassurance provided   thoughts/feelings acknowledged  Goal: Readiness for Transition of Care  Outcome: Met

## 2022-10-23 NOTE — Discharge Instructions (Signed)
Take vantin for once other day to complete 3 days treatment for UTI  Teke other meds as prescribed  Avoid constipation   F/u with PCP
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
## 2022-10-23 NOTE — Nursing Note (Signed)
A&Ox4, VSS, no c/o pain this morning, pt up ad lib, pt eval completed, 12 lead ekg completed, pain well managed with flexeril, pt to D/C at 1500 today.

## 2022-10-23 NOTE — Progress Notes (Signed)
Initial Case Management Assessment    Plan Reviewed with Medical Team.    Reason for Admission: N/V back pain    Medical Team Plan: PT eval, ABT UTI    Patient baseline prior to admission:     10/23/22 1347   Case Management Initial Assessment   Patient Discharged Before Able to Interview/Assess No   Source of Information Patient   Type of Residence Home   Expected Discharge Needs VNA   Patient Information   Interpreter Needs None   Home Caregiver Self   Accompanied by None   Services Active Prior to Admission VNA PT   Need PCP on Discharge No   Current Patient Responsibilities Personal Care   PTA Functional Status   Stairs to Enter House 5   Current Home Treatment/Devices/Equipment   (PT at hospital supplied patient with rolling walker and CM set HPT)   Is patient prescribed an anti-coagulant? No   Current Functional Status   Bathing Independent   Toileting Independent   Walking in Home Independent   Assistive Device Used None   Community Mobility Independent   IADL Assistance Needed Yes   Financial Information   Insurance Confirmed with Patient Yes   Legal Information   Does Patient Have HCP Yes   Discharge Planning   Patient/Family/Caregiver Discharge Preference Home w/ Services   Anticipated Discharge Disposition Home w/ VNA         Barriers anticipated: N/A    Patient/Caregiver Goals of Hospitalization:  Met patient at bedside, discussed discharge plan, at first patient did  not want any services through VNA , however after PT eval CM discussed recommendation for HPT, patient agreeable to [facility]OPT, [facility], [facility] form completed and placed in chart, Patient medically cleared for  discharge today with services.      [facility]
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
## 2022-10-23 NOTE — Nursing Note (Signed)
Discharged home with dtr,ambulatory with walker at 1550.

## 2022-10-23 NOTE — Care Plan (Signed)
Pt is A&OX4, VSS. NSR on tele. IV abx administered as per order. Pt's daughter called stating she isn't ready to have pt return home d/t having COVID and is currently unable to take care of her. Pt continues to report left sided pain , given pain meds with good effect.       Problem: Adult Inpatient Plan of Care  Goal: Plan of Care Review  Outcome: Ongoing, Progressing  Flowsheets (Taken 10/23/2022 0411)  Progress: improving  Plan of Care Reviewed With: patient  Goal: Patient-Specific Goal (Individualized)  Outcome: Ongoing, Progressing  Goal: Absence of Hospital-Acquired Illness or Injury  Outcome: Ongoing, Progressing  Intervention: Identify and Manage Fall Risk  Flowsheets (Taken 10/23/2022 0411)  Safety Promotion/Fall Prevention:  . activity supervised  . clutter-free environment maintained  . lighting adjusted  . fall prevention program maintained  . nonskid shoes/slippers when out of bed  . room organization consistent  Intervention: Prevent Skin Injury  Flowsheets (Taken 10/23/2022 0411)  Body Position: position changed independently  Intervention: Prevent and Manage VTE (Venous Thromboembolism) Risk  Flowsheets (Taken 10/23/2022 0411)  Range of Motion: active ROM (range of motion) encouraged  Activity Management: up ad lib  Intervention: Prevent Infection  Flowsheets (Taken 10/23/2022 0411)  Infection Prevention:  . hand hygiene promoted  . personal protective equipment utilized  . equipment surfaces disinfected  . rest/sleep promoted  . cohorting utilized  Goal: Optimal Comfort and Wellbeing  Outcome: Ongoing, Progressing  Intervention: Monitor Pain and Promote Comfort  Flowsheets (Taken 10/23/2022 0411)  Pain Management Interventions:  . care clustered  . medication offered  . relaxation techniques promoted  . warm blanket provided  Intervention: Provide Person-Centered Care  Flowsheets (Taken 10/23/2022 0411)  Trust Relationship/Rapport:  . choices provided  . care explained  . questions answered  .  questions encouraged  . emotional support provided  . reassurance provided  . empathic listening provided  . thoughts/feelings acknowledged  Goal: Readiness for Transition of Care  Outcome: Ongoing, Progressing

## 2022-10-24 LAB — URINE CULTURE: Urine Culture: >100000 — AB

## 2022-10-24 NOTE — Telephone Encounter (Signed)
Phone call attempting to prescreen for [facility], left message with return number.

## 2022-10-25 NOTE — Telephone Encounter (Signed)
2nd phone call attempting to prescreen for [facility], left message with return number.

## 2022-10-27 ENCOUNTER — Telehealth (HOSPITAL_BASED_OUTPATIENT_CLINIC_OR_DEPARTMENT_OTHER): Payer: Self-pay

## 2022-10-27 LAB — BLOOD CULTURE
Blood Culture: NO GROWTH
Blood Culture: NO GROWTH

## 2022-10-27 NOTE — Telephone Encounter (Signed)
Call out to pcp to discuss SOC, message left with answering service. Awaiting a call back.

## 2022-10-27 NOTE — Telephone Encounter (Signed)
Call received from covering provider, discussed soc for patient, unable to reach pt, v/o obtained for Tuesday 12/5

## 2022-10-27 NOTE — Telephone Encounter (Signed)
P Call    S:  Paged by answering service RE: [facility].    B::  - Paged from clinical coordinator at [facility]  - They received referral for pt for home PT  - Pt has not been picking up phone calls  - Emergency contacts have also not been picking up  - Will attempt one last time on Tuesday      Current Outpatient Medications:     DULoxetine (CYMBALTA) 30 MG capsule, Take 1 capsule by mouth daily for 7 days, THEN 2 capsules daily., Disp: 67 capsule, Rfl: 0    levothyroxine (SYNTHROID) 75 MCG tablet, Take 1 tablet by mouth in the morning, Disp: 90 tablet, Rfl: 3    atorvastatin (LIPITOR) 40 MG tablet, Take 1 tablet by mouth in the morning., Disp: 90 tablet, Rfl: 3    aspirin 81 MG EC tablet, Take 1 tablet by mouth in the morning., Disp: 90 tablet, Rfl: 3    latanoprost (XALATAN) 0.005 % ophthalmic solution, INSTILL 1 DROP INTO BOTH EYES AT BEDTIME, Disp: , Rfl:       A/R:  Beth Hudson is a 76 year old female with recent hospital admission and subsequent home PT referral. Has not been answering his phone so services were not started, and there will be one last attempt.     Routed to Dr. Caryl Comes and Dr. Eloise Levels.    Vergia Alcon, MD, 10/27/2022

## 2022-10-29 NOTE — Telephone Encounter (Signed)
3rd attempt to reach patient for [facility] visit, left message with return number.

## 2022-11-04 ENCOUNTER — Ambulatory Visit: Payer: Medicare Other | Attending: Family Medicine

## 2022-11-04 ENCOUNTER — Other Ambulatory Visit: Payer: Self-pay

## 2022-11-04 ENCOUNTER — Encounter (HOSPITAL_BASED_OUTPATIENT_CLINIC_OR_DEPARTMENT_OTHER): Payer: Self-pay

## 2022-11-04 VITALS — BP 134/63 | HR 84 | Temp 97.0°F | Wt 138.2 lb

## 2022-11-04 DIAGNOSIS — G8929 Other chronic pain: Secondary | ICD-10-CM | POA: Diagnosis not present

## 2022-11-04 DIAGNOSIS — E038 Other specified hypothyroidism: Secondary | ICD-10-CM | POA: Diagnosis not present

## 2022-11-04 DIAGNOSIS — M5442 Lumbago with sciatica, left side: Secondary | ICD-10-CM | POA: Diagnosis not present

## 2022-11-04 DIAGNOSIS — Z23 Encounter for immunization: Secondary | ICD-10-CM | POA: Insufficient documentation

## 2022-11-04 LAB — THYROID SCREEN TSH REFLEX FT4: THYROID SCREEN TSH REFLEX FT4: 8.87 u[IU]/mL — ABNORMAL HIGH (ref 0.270–4.200)

## 2022-11-04 LAB — FREE THYROXINE: FREE THYROXINE: 1.02 ng/dL (ref 0.93–1.70)

## 2022-11-04 NOTE — Progress Notes (Signed)
11/04/2022  Influenza Vaccine Procedure  November 04, 2022    1. Has the patient received the information for the influenza vaccine?  Yes    2. Does the patient have any of the following contraindications?  Allergy to eggs? No  Allergic reaction to previous influenza vaccines? No  Any other problems to previous influenza vaccines? No  Paralyzed by Guillain-Barre syndrome?  No  Currently pregnant? No  Current moderate or severe illness? No  Allergy to contact lens solution? No    3. The vaccine has been administered in the usual fashion and the patient/guardian was instructed to wait 20 minutes before leaving the building in the event of an allergic reaction:     Immunization information and current VIS for flu vaccine(s) reviewed; verbal consent given by patient/guardian.    VIS given prior to administration and reviewed with the patient and or legal guardian. Patient understands the disease and the vaccine. See immunization/Injection module or chart review for date of publication and additional information.  Judson Roch, LPN

## 2022-11-04 NOTE — Assessment & Plan Note (Signed)
A: Now improved. Back pain that was initially reported may have had some component of UTI/pyelonephritis pain due to eventual hospitalization for UTI needing IV abx. Unclear if confusion and weakness due to cymbalta or acute infection.     P:   - Placed duloxetine on allergy list per patient preference  - Back pain improved, hold on PT for now

## 2022-11-04 NOTE — Assessment & Plan Note (Signed)
A: Has been taking levothyroxine with good adherence for last 4 weeks. Has been having constipation    P:   - Recheck TSH  - Adjust levothyroxine per level

## 2022-11-04 NOTE — Progress Notes (Signed)
PRECEPTOR NOTE:  On the day of the patient's visit, I personally reviewed this case. I discussed the key elements of the history and exam with Dr. Marbas and I agree with the assessment and plan as documented in the visit note.     Jowana Thumma, MD

## 2022-11-04 NOTE — Progress Notes (Signed)
[facility] FAM[facility] MEDICINE  Office Visit Note     Subjective:   Beth Hudson is a 76 year old female patient who presents today for: Patient presents with:  Follow Up: Zoster, flu      #Sciatica F/u  - Seen 1 mo ago for sciatic pain  - No improvement with steroid burst, NSAIDs, muscle relaxer  - Trial of low dose duloxetine  - Patient reports confusion, adverse reaction to duloxetine  - Was taken to [facility] ED  - Per hospital notes, had N/V and found to have UTI  - Treated with 2 days of IV abx, also treated for constipation  - Now feeling much better  - No dysuria, constipation  - Back feels back to normal, no longer feels as though she needs PT      No interpreter was required during this encounter.     ROS:  Review of systems conducted and pertinent review of systems as noted in HPI.    Patient Active Problem List:     Unspecified hypothyroidism     Epistaxis     Carpal tunnel syndrome     Disorder of bone and cartilage, unspecified     Family health problem     Second hand smoke exposure     Lichen planus      [facility] [phone] Option #4     Colon polyps, adenomatous     Angiodysplasia of cecum     Acute pain of both shoulders     Arthritis     Carotid artery stenosis     Cataract     Encounter for screening for malignant neoplasm of colon     Stress incontinence of urine     Hyperglycemia     Hyperlipidemia     Right facial pain     Syncope     Incontinence of feces     Elevated BP without diagnosis of hypertension     Chronic left-sided low back pain with left-sided sciatica      MEDS:    Current Outpatient Medications:     levothyroxine (SYNTHROID) 75 MCG tablet, Take 1 tablet by mouth in the morning, Disp: 90 tablet, Rfl: 3    atorvastatin (LIPITOR) 40 MG tablet, Take 1 tablet by mouth in the morning., Disp: 90 tablet, Rfl: 3    aspirin 81 MG EC tablet, Take 1 tablet by mouth in the morning., Disp: 90 tablet, Rfl: 3    latanoprost (XALATAN) 0.005 % ophthalmic solution,  INSTILL 1 DROP INTO BOTH EYES AT BEDTIME, Disp: , Rfl:     Review of Patient's Allergies indicates:   Strawberry              Rash    Comment:Unable to eat fresh strawberries, ok with the             restUnable to eat fresh strawberries, ok with the             restUnable to eat fresh strawberries, ok with the             restUnable to eat fresh strawberries, ok with the             restUnable to eat fresh strawberries, ok with the             restUnable to eat fresh strawberries, ok with the             rest    Objective:   BP  134/63   Pulse 84   Temp 97 F (36.1 C) (Temporal)   Wt 62.7 kg (138 lb 3.2 oz)   SpO2 96%   BMI 21.65 kg/m   General: No acute distress, well appearing.  HEENT:  PERRL, EOMI, sclera non-injected.   Cardio: Regular rate and rhythm, S1, S2 normal. No murmur, rubs or gallops.  Pulm: Clear to auscultation bilaterally, no wheezing, rhonchi or crackles.  Ext: Warm, well-perfused. No lower extremity edema bilaterally.  Neuro: AOx3. CN grossly intact. Gait normal.    Assessment & Plan:    Beth Hudson is a 76 year old female who presented to clinic today for Patient presents with:  Follow Up: Zoster, flu       Problem List Items Addressed This Visit          Endocrine and Metabolic    Unspecified hypothyroidism     A: Has been taking levothyroxine with good adherence for last 4 weeks. Has been having constipation    P:   - Recheck TSH  - Adjust levothyroxine per level           Relevant Orders    THYROID SCREEN TSH REFLEX FT4       Musculoskeletal and Injuries    Chronic left-sided low back pain with left-sided sciatica - Primary     A: Now improved. Back pain that was initially reported may have had some component of UTI/pyelonephritis pain due to eventual hospitalization for UTI needing IV abx. Unclear if confusion and weakness due to cymbalta or acute infection.     P:   - Placed duloxetine on allergy list per patient preference  - Back pain improved, hold on PT for now            Other  Visit Diagnoses       Need for prophylactic vaccination and inoculation against influenza        Relevant Orders    IMMUNIZATION ADMIN SINGLE (Completed)    INFLUENZA VACCINE HIGH DOSE FOR 65 AND OLDER, 0.5ML, IM (Completed)                  I have reviewed the past medical, family, and social history sections including the medications and allergies listed in the above medical record. I reconciled the patient's medication list and prepared and supplied needed refills. The patient indicates understanding of the above issues and agrees with the plan. The patient has been given an After Visit Summary sheet that lists all of their medications with directions, their allergies, orders placed during this encounter, immunization dates, and follow- up instructions.    Discussed with Dr. Virl Cagey.     Beth Shock, MD, 11/04/2022

## 2022-11-08 ENCOUNTER — Telehealth (HOSPITAL_BASED_OUTPATIENT_CLINIC_OR_DEPARTMENT_OTHER): Payer: Self-pay

## 2022-11-08 NOTE — Telephone Encounter (Signed)
Beth Hudson [id number], 77 year old, female    Calls today:  Clinical Questions (NON-SICK CLINICAL QUESTIONS ONLY)    Name of person calling Jacqlyn Larsen- [facility]    Specific nature of request Jacqlyn Larsen is calling for a status update on a "Delayed Start of Care" order that was faxed over to the office on 12/4. Please give her a call if the order was not received, or fax back ASAP.    Return phone number [phone]  ext [phone]    Person calling on behalf of patient: [facility]    Rod Can NUMBER: [phone]  ext [phone]  Best time to call back: ASAP  Cell phone:   Other phone:    Patient's language of care: English    Patient does not need an interpreter.    Patient's PCP: Johny Shock, MD    Primary Care Home Site:  [facility]

## 2022-11-13 ENCOUNTER — Ambulatory Visit: Payer: Medicare Other

## 2022-11-17 ENCOUNTER — Other Ambulatory Visit (HOSPITAL_BASED_OUTPATIENT_CLINIC_OR_DEPARTMENT_OTHER): Payer: Self-pay

## 2022-11-17 DIAGNOSIS — G8929 Other chronic pain: Secondary | ICD-10-CM

## 2022-11-17 NOTE — Telephone Encounter (Signed)
Per Pharmacy, Beth Hudson is a 76 year old female has requested a refill of DULOXETINE.      Last Office Visit: 11/04/2022 with PCP  Last Physical Exam:      There are no preventive care reminders to display for this patient.    Other Med Adult:  Most Recent BP Reading(s)  11/04/22 : 134/63        Cholesterol (mg/dL)   Date Value   04/17/2022 205     LOW DENSITY LIPOPROTEIN DIRECT (mg/dL)   Date Value   04/17/2022 123     HIGH DENSITY LIPOPROTEIN (mg/dL)   Date Value   04/17/2022 47     TRIGLYCERIDES (mg/dL)   Date Value   04/17/2022 283 (H)         THYROID SCREEN TSH REFLEX FT4 (uIU/mL)   Date Value   11/04/2022 8.870 (H)         TSH (THYROID STIM HORMONE) (uIU/mL)   Date Value   10/05/2022 51.800 (H)       HEMOGLOBIN A1C (%)   Date Value   04/17/2022 5.7 (H)       No results found for: "POCA1C"      No results found for: "INR"    SODIUM (mmol/L)   Date Value   04/17/2022 143       POTASSIUM (mmol/L)   Date Value   04/17/2022 3.9           CREATININE (mg/dL)   Date Value   04/17/2022 0.8       Documented patient preferred pharmacies:    [facility] [place], [place] [address]  Phone: [phone] Fax: [phone]    [id number] Phone: [phone]: [address] Phone: [phone]: [phone]

## 2022-12-02 ENCOUNTER — Telehealth (HOSPITAL_BASED_OUTPATIENT_CLINIC_OR_DEPARTMENT_OTHER): Payer: Self-pay

## 2022-12-02 NOTE — Progress Notes (Signed)
Called PT to schedule colonoscopy.  Unable to leave a message.

## 2022-12-19 ENCOUNTER — Ambulatory Visit (HOSPITAL_BASED_OUTPATIENT_CLINIC_OR_DEPARTMENT_OTHER): Payer: Medicare Other

## 2023-04-09 ENCOUNTER — Ambulatory Visit: Admit: 2023-04-09 | Discharge: 2023-04-09 | Payer: MEDICARE

## 2023-04-09 DIAGNOSIS — H401132 Primary open-angle glaucoma, bilateral, moderate stage: Secondary | ICD-10-CM

## 2023-04-09 MED ORDER — latanoprost (Xalatan) 0.005 % ophthalmic solution
0.005 | Freq: Every evening | OPHTHALMIC | 5 refills | 45.00000 days | Status: AC
Start: 2023-04-09 — End: ?
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
## 2023-08-13 ENCOUNTER — Other Ambulatory Visit: Payer: Self-pay

## 2023-08-13 ENCOUNTER — Ambulatory Visit: Payer: Medicare Other | Attending: Family Medicine | Admitting: Family Medicine

## 2023-08-13 VITALS — BP 127/56 | HR 70 | Temp 96.7°F | Wt 135.0 lb

## 2023-08-13 DIAGNOSIS — R152 Fecal urgency: Secondary | ICD-10-CM | POA: Diagnosis present

## 2023-08-13 DIAGNOSIS — E038 Other specified hypothyroidism: Secondary | ICD-10-CM | POA: Insufficient documentation

## 2023-08-13 DIAGNOSIS — Z23 Encounter for immunization: Secondary | ICD-10-CM | POA: Insufficient documentation

## 2023-08-13 DIAGNOSIS — R159 Full incontinence of feces: Secondary | ICD-10-CM | POA: Diagnosis present

## 2023-08-13 MED ORDER — PSYLLIUM 58.6 % PO PACK
1.00 | PACK | Freq: Every day | ORAL | 2 refills | Status: AC
Start: 2023-08-13 — End: 2023-11-11

## 2023-08-13 MED ORDER — LEVOTHYROXINE SODIUM 75 MCG PO TABS
75.0000 ug | ORAL_TABLET | Freq: Every morning | ORAL | 3 refills | Status: DC
Start: 2023-08-13 — End: 2024-01-20

## 2023-08-13 NOTE — Progress Notes (Signed)
Influenza Vaccine Procedure  August 13, 2023    1. Has the patient received the information for the influenza vaccine?  Yes    2. Does the patient have any of the following contraindications?  Allergy to eggs? No  Allergic reaction to previous influenza vaccines? No  Any other problems to previous influenza vaccines? No  Paralyzed by Guillain-Barre syndrome?  No  Currently pregnant? No  Current moderate or severe illness? No  Allergy to contact lens solution? No    3. The vaccine has been administered in the usual fashion and the patient/guardian was instructed to wait 20 minutes before leaving the building in the event of an allergic reaction:     Immunization information and current VIS for flu vaccine(s) reviewed; verbal consent given by patient/guardian.

## 2023-08-13 NOTE — Addendum Note (Signed)
Addended by: Beckie Busing on: 08/13/2023 10:36 AM     Modules accepted: Orders

## 2023-08-13 NOTE — Progress Notes (Signed)
77 y/o with hypothyroidism    #hypothyroidism  -ran out of meds 2 months ago  -before that, taking it every day at least 30 minutes before eating  -meds previously adjusted in 10/2022 when TSH was high (but it looks like she was never informed of this and her medication was not adjusted; before that/last refill in system from [address] was 75 mcg/day; unclear if she was taking meds at that visit)    #stool incontinence  -easily comes out  -wearing diapers X 1 years    #social  -lives in [place], moving to [place] with son in early December b/c rent is too high  -work at [employer] about 3-4 days a week 10:30-5:00; cooks and serves food sample  -does household chores  -husband passed 3 years ago  -enjoys watching spanish soap opera  -non-smoker  -Secretary/administrator on Wed afternoon    #PHM- flu shot    O:BP 127/56   Pulse 70   Temp 96.7 F (35.9 C) (Temporal)   Wt 61.2 kg (135 lb)   SpO2 99%   BMI 21.14 kg/m   Gen: AAOX3, in NAD  HEENT: EOMI, PERRLA, TMs normal bilaterally, oropharynx normal, nasal turbinates normal  Heart. rrr, no mrg  Lungs: CTAb  Psych: Mood: "good"  Affect: full, appropriate  TC: NO SI/HI  TP: linear  Well dressed and groomed  Speech: non-pressured  J/I: good               Assessment and Plan:  (E03.8) Other specified hypothyroidism  (primary encounter diagnosis)  Comment: ran of meds 2 months ago, so if we check labs today they would not be accurate. TFTs previously high but it is also unclear if she was taking meds then; the only does I see in her chart is 75 mcg/day so  we will start her and have her take this for 6 weeks and then check albs and adjust from there. She knows to take on an empty stomach at least 30 minutes before eating  Plan:   -levothyroxine 75 mcg/day  -TSH- future orders for ~ 6 weeks from now      (R15.9,  R15.2) Incontinence of feces with fecal urgency  Comment: wears diapers, will give bulking agent  Plan: psyllium        Flu shot today    Crawford Givens, MD

## 2023-11-28 IMAGING — MR MAO DIREITA
7 series · 40 of 40 positions shown · non-contrast
Comparison: none

[Series 2: localizar · axial · right · 5.0mm · 0.66mm/px · z∈[-25,+25]mm · 4 of 10 slices shown]
[im 1/10]
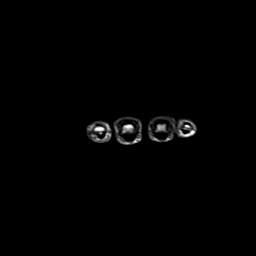
[im 4/10]
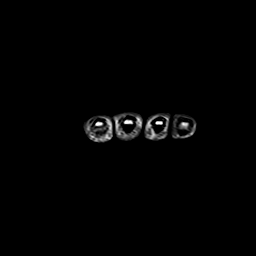
[im 7/10]
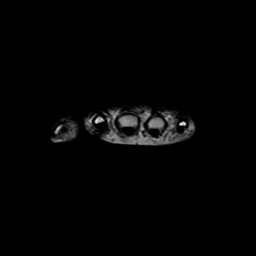
[im 10/10]
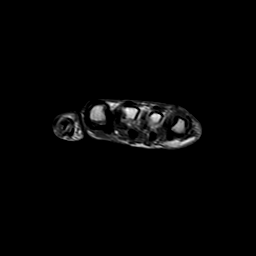

[Series 3: T1 · coronal · right · 2.5mm · 0.62mm/px · 6 of 12 slices shown (1 of 3)]
[im 1/12]
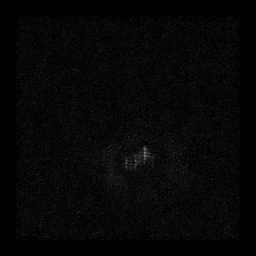
[im 3/12]
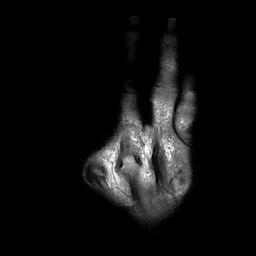
[im 5/12]
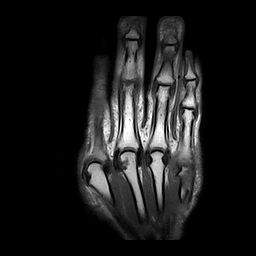
[im 7/12]
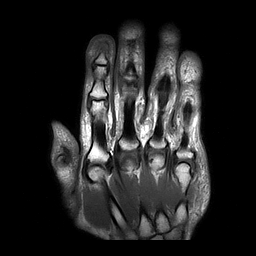
[im 9/12]
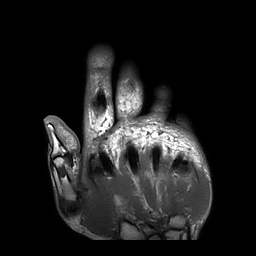
[im 12/12]
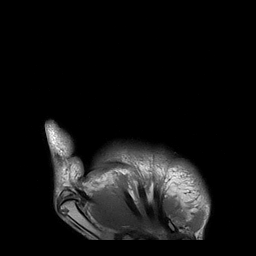

[Series 4: fstir coronal · coronal · right · 3.5mm · 0.55mm/px · 4 of 9 slices shown]
[im 1/9]
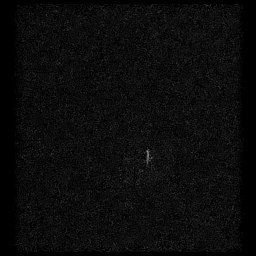
[im 3/9]
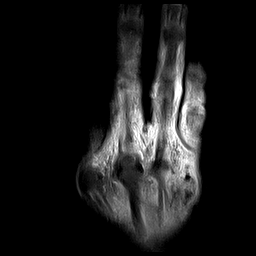
[im 6/9]
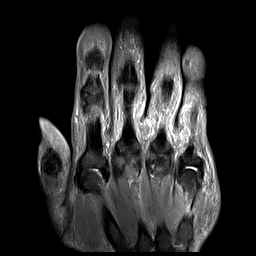
[im 9/9]
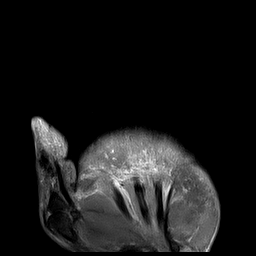

[Series 5: STIR · sagittal · right · 3.0mm · 0.55mm/px · 3 of 6 slices shown (1 of 2)]
[im 1/6]
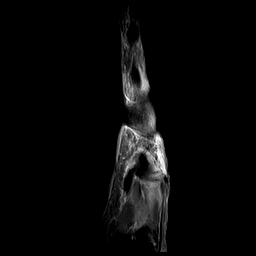
[im 3/6]
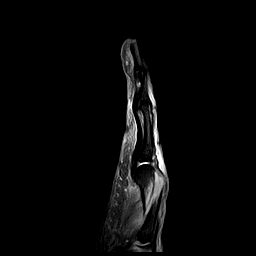
[im 6/6]
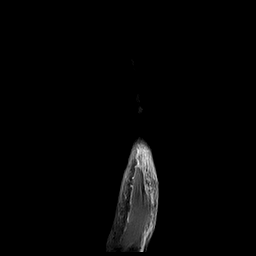

[Series 6: T1 · sagittal · right · 3.0mm · 0.59mm/px · 3 of 6 slices shown (2 of 3)]
[im 1/6]
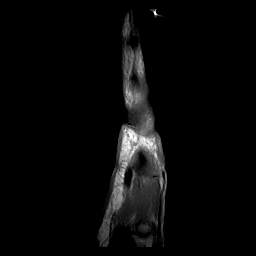
[im 3/6]
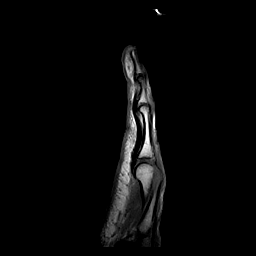
[im 6/6]
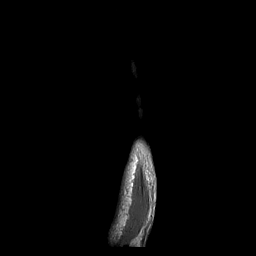

[Series 7: STIR · axial · right · 4.0mm · 0.55mm/px · z∈[-37,+57]mm · 10 of 20 slices shown (2 of 2)]
[im 1/20]
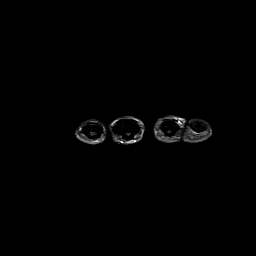
[im 3/20]
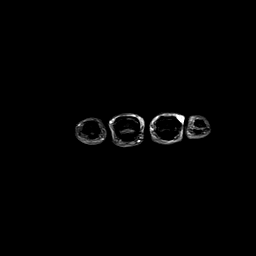
[im 5/20]
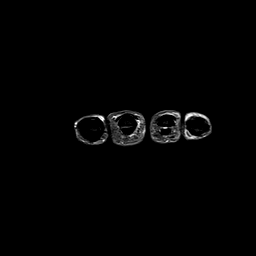
[im 7/20]
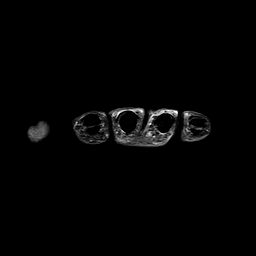
[im 9/20]
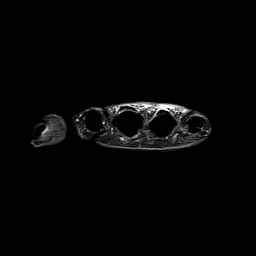
[im 11/20]
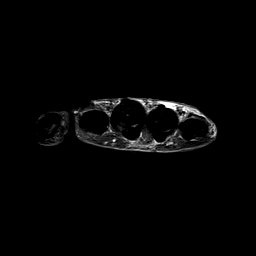
[im 13/20]
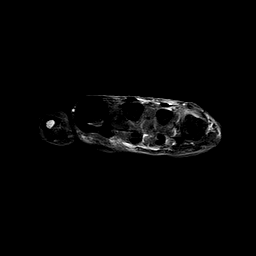
[im 15/20]
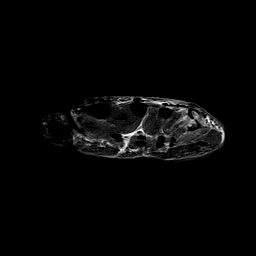
[im 17/20]
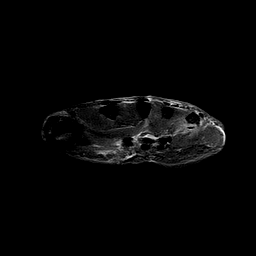
[im 20/20]
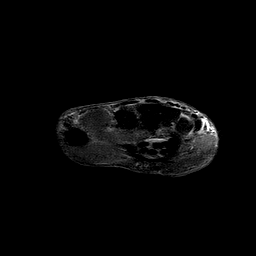

[Series 8: T1 · axial · right · 4.0mm · 0.59mm/px · z∈[-38,+57]mm · 10 of 20 slices shown (3 of 3)]
[im 1/20]
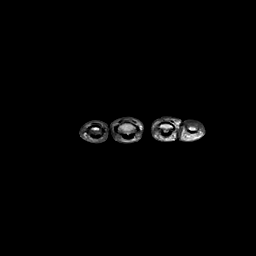
[im 3/20]
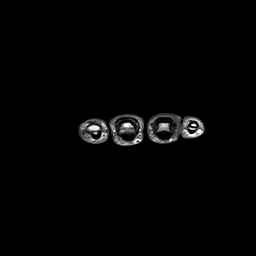
[im 5/20]
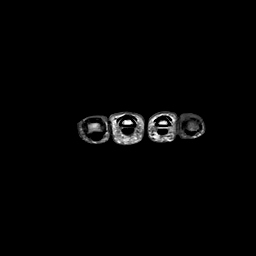
[im 7/20]
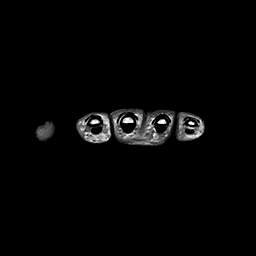
[im 9/20]
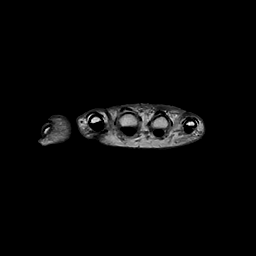
[im 11/20]
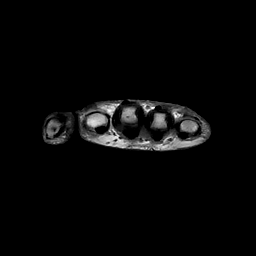
[im 13/20]
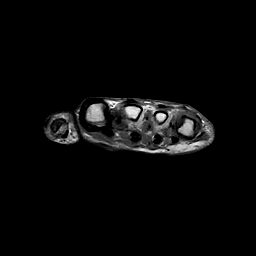
[im 15/20]
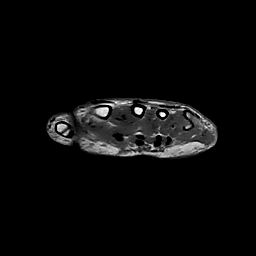
[im 17/20]
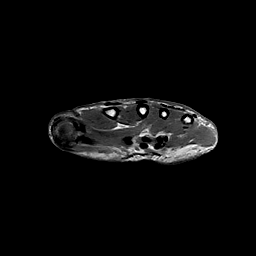
[im 20/20]
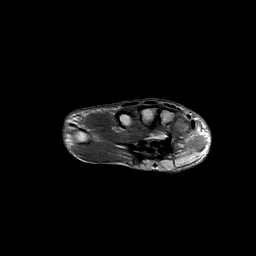

[40 of 40 positions shown; findings below may reference images not displayed]

RESSONÂNCIA MAGNÉTICA DA MÃO DIREITA

TÉCNICA:
Exame realizado em equipamento de ressonância magnética com sequências, ponderações e planos específicos para o segmento de interesse, sem a administração endovenosa do meio de contraste.

RESULTADO:
Fratura com desnivelamento da diáfise do quinto metacarpo associado a edema das extremidades ósseas e das partes moles adjacente
Sinais incipientes de osteoartrose das articulações interfalangeanas médias e distais os dedos
Demais estruturas ósseas com corticais íntegras e espaços medulares sem anormalidades de sinal.
Cartilagens articulares de contornos e intensidade de sinal normais.
Ausência de derrame articular ou espessamento sinovial significativos.
Tendões extensores e flexores com forma, contornos, espessura e intensidade de sinal normais.
Ausência de lesões expansivas em partes moles periarticulares.
Planos musculares regionais com morfologia, trofismo e intensidade de sinal habituais.

CONCLUSÃO:
Fratura com desnivelamento da diáfise do quinto metacarpo associado a edema das extremidades ósseas e das partes moles adjacente

## 2024-01-20 ENCOUNTER — Ambulatory Visit: Payer: Medicare Other | Attending: Family Medicine

## 2024-01-20 VITALS — BP 131/67 | HR 84 | Temp 95.9°F | Wt 133.4 lb

## 2024-01-20 DIAGNOSIS — R252 Cramp and spasm: Secondary | ICD-10-CM | POA: Insufficient documentation

## 2024-01-20 DIAGNOSIS — Z23 Encounter for immunization: Secondary | ICD-10-CM | POA: Diagnosis not present

## 2024-01-20 DIAGNOSIS — E038 Other specified hypothyroidism: Secondary | ICD-10-CM | POA: Diagnosis not present

## 2024-01-20 LAB — TOTAL IRON BINDING CAPACITY: TOTAL IRON BIND CAPACITY CALC: 508 ug/dL — ABNORMAL HIGH (ref 280–504)

## 2024-01-20 LAB — FERRITIN: FERRITIN: 8 ng/mL — ABNORMAL LOW (ref 13–150)

## 2024-01-20 LAB — CBC WITH PLATELET
ABSOLUTE NRBC COUNT: 0 10*3/uL (ref 0.0–0.0)
HEMATOCRIT: 28.8 % — ABNORMAL LOW (ref 34.1–44.9)
HEMOGLOBIN: 8.5 g/dL — ABNORMAL LOW (ref 11.2–15.7)
MEAN CORP HGB CONC: 29.5 g/dL — ABNORMAL LOW (ref 31.0–37.0)
MEAN CORPUSCULAR HGB: 22.2 pg — ABNORMAL LOW (ref 26.0–34.0)
MEAN CORPUSCULAR VOL: 75.2 fL — ABNORMAL LOW (ref 80.0–100.0)
MEAN PLATELET VOLUME: 9.7 fL (ref 8.7–12.5)
NRBC %: 0 % (ref 0.0–0.0)
PLATELET COUNT: 373 10*3/uL (ref 150–400)
RBC DISTRIBUTION WIDTH STD DEV: 43 fL (ref 35.1–46.3)
RED BLOOD CELL COUNT: 3.83 M/uL — ABNORMAL LOW (ref 3.90–5.20)
WHITE BLOOD CELL COUNT: 7.6 10*3/uL (ref 4.0–11.0)

## 2024-01-20 LAB — BASIC METABOLIC PANEL
ANION GAP: 10 mmol/L (ref 10–22)
BUN (UREA NITROGEN): 18 mg/dL (ref 7–18)
CALCIUM: 9.6 mg/dL (ref 8.5–10.5)
CARBON DIOXIDE: 26 mmol/L (ref 21–32)
CHLORIDE: 103 mmol/L (ref 98–107)
CREATININE: 0.7 mg/dL (ref 0.4–1.2)
ESTIMATED GLOMERULAR FILT RATE: >60 mL/min (ref >60)
Glucose Random: 92 mg/dL (ref 74–160)
POTASSIUM: 3.6 mmol/L (ref 3.5–5.1)
SODIUM: 139 mmol/L (ref 136–145)

## 2024-01-20 LAB — FREE THYROXINE: FREE THYROXINE: 0.41 ng/dL — ABNORMAL LOW (ref 0.92–1.68)

## 2024-01-20 LAB — THYROID SCREEN TSH REFLEX FT4: THYROID SCREEN TSH REFLEX FT4: 63.2 u[IU]/mL — ABNORMAL HIGH (ref 0.270–4.200)

## 2024-01-20 LAB — IRON: IRON: 15 ug/dL — ABNORMAL LOW (ref 50–170)

## 2024-01-20 MED ORDER — LEVOTHYROXINE SODIUM 75 MCG PO TABS: 75.0000 ug | ORAL_TABLET | Freq: Every morning | ORAL | 3 refills | Status: DC

## 2024-01-20 NOTE — Progress Notes (Signed)
 [facility] FAMILY MEDICINE  Office Visit Note     Subjective:   Beth Hudson is a 78 year old female patient who presents today for: Patient presents with:  Med Question  Cramps (Pedi): At nigh t  Back Pain      #hypthyroid  - needs TSH  - ran out of levothyroxine 3 weeks ago  - fatigue is worse  - no constipation, but has had incontinence  - no hair loss  - weight stable  - no edema    #leg cramps  -every night  -as soon as she lies down  -improves when walking around  -no numbness or tingling in toes  -has been going on for years  -feels like a tight cramp, hard to move leg  -eats ice, doesn't drink a lot of water  -feels thirsty a lot  -no blood in stool or urine  -eating a varied diet      Objective:   BP 131/67   Pulse 84   Temp 95.9 F (35.5 C) (Temporal)   Wt 60.5 kg (133 lb 6.4 oz)   SpO2 97%   BMI 20.89 kg/m   General: Appears stated age, in no acute distress, answers questions appropriately, cooperative with exam.  HEENT: NCAT. Clear conjunctiva, no scleral icterus. EOMI.  Neck: Supple, trachea and spine midline, normal ROM.  Lungs: Non-labored respirations. CTAB, no W/R/C. B/L equal chest expansion.  Heart: RRR, +S1/S2, no M/R/G.  Abdomen: +BS, soft, NTND, no guarding or rebound, no organomegaly or masses.  Back: No obvious deformity.  MSK: Warm, moving four extremities independently, full active ROM, no peripheral edema or cyanosis.  Skin: Warm, dry, no rashes or lesions noted.  Neuro: AAO x 4. No focal deficits. Gait grossly normal.  Psych: Interactive with questions. Normal mood and affect. Judgement and thought content are normal.    Assessment & Plan:    1. Other specified hypothyroidism  Has not been taking medication for three weeks. Refilling today and checking TSH -- will need repeat in 4-6 weeks for monitoring.  - levothyroxine (SYNTHROID) 75 MCG tablet; Take 1 tablet by mouth in the morning  Dispense: 90 tablet; Refill: 3  - THYROID SCREEN TSH REFLEX FT4    2. Leg cramps  Restless leg vs  dehydration. Getting full iron studies given report of ice chip consumption c/f pica.   - THYROID SCREEN TSH REFLEX FT4  - FERRITIN  - BASIC METABOLIC PANEL  - IRON  - TOTAL IRON BINDING CAPACITY  - CBC WITH PLATELET    3. COVID-19 vaccine administered  - IMMUNIZATION ADMIN SINGLE  - MODERNA > 12 YO COVID-19      1. The patient indicates understanding of these issues and agrees with the plan.  2.  The patient is given an After Visit Summary sheet that lists all of their medications with directions, their allergies, orders placed during this encounter, immunization dates, and follow- up instructions.      Follow-up: 4-6 weeks    Discussed with Dr. Elspeth Cho, MD   PGY-2  01/20/24 10:26 AM

## 2024-01-20 NOTE — Progress Notes (Signed)
 Preceptor Note  I personally interviewed and examined this patient, along with Dr. Cheree Ditto. I confirm the key elements of the history and physical, and agree with the assessment and plan, as documented in the visit note.      Beth Harry, MD

## 2024-01-20 NOTE — Progress Notes (Signed)
 Hospital Administered Immunizations Administered on Date of Encounter - 01/20/2024  Reviewed on 01/20/2024      Name       Beth Hudson >60AV Covid-19             VIS given prior to administration and reviewed with the patient and or legal guardian. Patient understands the disease and the vaccine. See immunization/Injection module or chart review for date of publication and additional information.  Ajani Rineer, [place], LPN

## 2024-01-20 NOTE — Patient Instructions (Signed)
 Please do the following after your visit:     Immunizations: please stay in the room     Appointment: CPEX in 1-2 months with PCP   If appointment not available: per patient preference    Labs: main hallway

## 2024-01-21 ENCOUNTER — Telehealth (HOSPITAL_BASED_OUTPATIENT_CLINIC_OR_DEPARTMENT_OTHER): Payer: Self-pay

## 2024-01-21 DIAGNOSIS — E038 Other specified hypothyroidism: Secondary | ICD-10-CM

## 2024-01-21 NOTE — Telephone Encounter (Signed)
 Telephone Call     Recent labs notable for TSH 63.2, FT4 0.41 consistent with untreated hypothyroidism iso patient running out of levothyroxine.     CBC notable for microcytic, hypochromic anemia with iron studies showing ferritin 8 and TSAT ~3% consistent with severe iron deficiency anemia.     Called patient 01/21/24 to update on results. No answer, LVM asking for call back.     If patient calls back:    -We will need to repeat thyroid labs in 6 weeks to make sure medication is working; labs have been placed, can come as a walk-in  -Given her anemia, how low her iron levels are, and the leg discomfort she is reporting, it would not be unreasonable to do iron infusions. This would most likely be an infusion a week for three weeks, infusions typically take a few hours. If she does not want to do infusions, I will send iron supplements to the pharmacy -- these should be taken every other day to maximize absorption.   -It is also recommended she undergo a colonoscopy as lower intestinal sources of bleeding are not uncommon at this age.     Please route back to me to place any necessary orders.    Steffanie Dunn, MD, 01/21/2024

## 2024-01-22 ENCOUNTER — Telehealth (HOSPITAL_BASED_OUTPATIENT_CLINIC_OR_DEPARTMENT_OTHER): Payer: Self-pay

## 2024-01-22 NOTE — Telephone Encounter (Signed)
 This RN called pt, no answer. VM left to call the clinic back at [phone].

## 2024-01-22 NOTE — Telephone Encounter (Signed)
 [facility]    Sabra Sessler [id number], 78 year old, female,   Telephone Information:   Home Phone [phone]   Work Phone Not on file.   Mobile [phone]       Patient's Preferred Pharmacy:     [id number], [place] - [address]  Phone: [phone] Fax: [phone]    [id number] [place], [place] - [address]  Phone: [phone] Fax: [phone]    [facility] - [place], [place] - [address]  Phone: [phone] Fax: [phone]    [facility] [id number] - [place], [place] - [address] AT [facility] [address]  Phone: [phone] Fax: [phone]      CONFIRMED TODAY: Yes    CALL BACK NUMBER: [phone]       Patient's language of care: English    Patient does not need an interpreter.    Patient's PCP: Cena Benton, MD    Person calling on behalf of patient: Patient (self)    Calls today Returning phone call for labs

## 2024-01-23 ENCOUNTER — Telehealth (HOSPITAL_BASED_OUTPATIENT_CLINIC_OR_DEPARTMENT_OTHER): Payer: Self-pay

## 2024-01-23 NOTE — Telephone Encounter (Signed)
 Returned call to patient. No answer--left general VMM, asking for call back to clinic to speak with any RN. See Dr. Dwana Curd encounter from 2/26.          January 21, 2024  Steffanie Dunn, MD   CG    01/21/24  6:08 PM  Note      Telephone Call      Recent labs notable for TSH 63.2, FT4 0.41 consistent with untreated hypothyroidism iso patient running out of levothyroxine.      CBC notable for microcytic, hypochromic anemia with iron studies showing ferritin 8 and TSAT ~3% consistent with severe iron deficiency anemia.      Called patient 01/21/24 to update on results. No answer, LVM asking for call back.      If patient calls back:     -We will need to repeat thyroid labs in 6 weeks to make sure medication is working; labs have been placed, can come as a walk-in  -Given her anemia, how low her iron levels are, and the leg discomfort she is reporting, it would not be unreasonable to do iron infusions. This would most likely be an infusion a week for three weeks, infusions typically take a few hours. If she does not want to do infusions, I will send iron supplements to the pharmacy -- these should be taken every other day to maximize absorption.   -It is also recommended she undergo a colonoscopy as lower intestinal sources of bleeding are not uncommon at this age.      Please route back to me to place any necessary orders.     Steffanie Dunn, MD, 01/21/2024

## 2024-01-23 NOTE — Telephone Encounter (Signed)
 Beth Hudson [id number], 78 year old, female    Calls today: Clinical Questions (NON-SICK CLINICAL QUESTIONS ONLY)    Name of person calling  Beth Hudson  Specific nature of request  Returning a call from nurse  Return phone number [phone]    Person calling on behalf of patient: Patient (self)    CALL BACK NUMBER: [phone]  Best time to call back: any time  Cell phone:   Other phone:    Patient's language of care: English    Patient does not need an interpreter.    Patient's PCP: Cena Benton, MD    Primary Care Home Site:  [facility]

## 2024-01-26 NOTE — Telephone Encounter (Signed)
 2nd outreach call placed  Unable to reach  Lmom to call back to office to speak with nursing to review test results

## 2024-01-26 NOTE — Telephone Encounter (Signed)
 Patient came to the front desk for lab results. Please call patient back about her lab results.

## 2024-01-27 NOTE — Telephone Encounter (Signed)
 Called and spoke to pt. Discussed with pt message from Dr. Cheree Ditto. Pt agrees to come back to lab for thyroid recheck in 6 weeks. Pt would like to have iron transfusions. And pt agrees to have colonoscopy done. Pt also inquiring about a form she left with Dr. Cheree Ditto to have filled out. Notified pt will route message to provider for further advisement. Pt verbalizes understanding and agrees with plan.

## 2024-01-29 ENCOUNTER — Other Ambulatory Visit (HOSPITAL_BASED_OUTPATIENT_CLINIC_OR_DEPARTMENT_OTHER): Payer: Self-pay

## 2024-01-29 DIAGNOSIS — D509 Iron deficiency anemia, unspecified: Secondary | ICD-10-CM | POA: Insufficient documentation

## 2024-01-29 NOTE — Telephone Encounter (Signed)
 Telephone Call    Called patient 01/29/24 to discuss iron infusions. Reviewed risks and benefits as outlined on consent form and patient expressed verbal consent for infusions. Will sign e-consent remotely or when next at [facility] facility.     Discussed that her form will be ready to pick up at end of the week.    Steffanie Dunn, MD, 01/29/2024

## 2024-01-30 ENCOUNTER — Encounter (HOSPITAL_BASED_OUTPATIENT_CLINIC_OR_DEPARTMENT_OTHER): Payer: Self-pay

## 2024-01-30 NOTE — Progress Notes (Signed)
 Colonoscopy order received, Spoke to patient.     Patient has been scheduled on 03/03/24    [facility].    Please call with instructions

## 2024-02-04 ENCOUNTER — Telehealth (HOSPITAL_BASED_OUTPATIENT_CLINIC_OR_DEPARTMENT_OTHER): Payer: Self-pay

## 2024-02-04 NOTE — Telephone Encounter (Signed)
 LCVM letting the pt know there paper work is ready to be picked up at the [facility]. I reminded the patient to bring a photo ID when she comes to the clinic

## 2024-02-20 ENCOUNTER — Other Ambulatory Visit: Payer: Self-pay

## 2024-02-20 ENCOUNTER — Ambulatory Visit: Attending: Family Medicine

## 2024-02-20 ENCOUNTER — Encounter (HOSPITAL_BASED_OUTPATIENT_CLINIC_OR_DEPARTMENT_OTHER): Payer: Self-pay

## 2024-02-20 VITALS — BP 157/72 | HR 76 | Wt 133.0 lb

## 2024-02-20 DIAGNOSIS — D509 Iron deficiency anemia, unspecified: Secondary | ICD-10-CM | POA: Insufficient documentation

## 2024-02-20 MED ORDER — SODIUM CHLORIDE 0.9 % IV SOLN
300.0000 mg | Freq: Once | INTRAVENOUS | Status: AC
Start: 2024-02-20 — End: 2024-02-20
  Administered 2024-02-20: 300 mg via INTRAVENOUS
  Filled 2024-02-20: qty 15

## 2024-02-20 NOTE — Progress Notes (Signed)
 Beth Hudson is a 78 year old female who attended Infusion Room today for planned #1/3 iron sucrose infusion, appeared well on arrival, no voiced questions or concerns. Vital signs stable, IV inserted as per flowsheet. Venofer 300mg  infused as per [facility] with no noted concerns, pt tolerated well, vitals signs remain stable.She was monitored for 30 mins post infusion without incident.  Discharge teaching completed, IV removed as per flowsheet, given AVS and aware of next appointments. Discharged patient, patient aware to call if any questions of concerns.

## 2024-02-20 NOTE — Patient Instructions (Addendum)
 Today you received an iron infusion. Some patients will experience side effects up to 2 days after receiving iron infusions such as headache, joint aches and muscle pains. You may take an over-the-counter pain reliever approved by your doctor for symptom relief. Please call the clinic with any questions or concerns (8:30am-4:30pm, M-F) at [phone]. Go to the Emergency Room or call 9-1-1 if you need urgent medical attention.

## 2024-02-23 ENCOUNTER — Encounter (HOSPITAL_BASED_OUTPATIENT_CLINIC_OR_DEPARTMENT_OTHER): Payer: Self-pay

## 2024-02-23 ENCOUNTER — Other Ambulatory Visit (HOSPITAL_BASED_OUTPATIENT_CLINIC_OR_DEPARTMENT_OTHER): Payer: Self-pay

## 2024-02-23 MED ORDER — PEG 3350-KCL-NABCB-NACL-NASULF 236 G PO SOLR
4.0000 L | ORAL | 0 refills | Status: AC
Start: 2024-02-23 — End: 2024-03-24

## 2024-02-23 NOTE — Telephone Encounter (Signed)
 Left message in patient's voicemail.     Confirmed patient's procedure at [facility] on 03/03/24 at  6:30am.      Ride home requirement reviewed.      Asked patient to call [phone] or [phone] with any questions or cancellation request.      RX- 1 day Golytely prep instructions sent via mail and RX sent to [facility] in [place] on [address].     Reviewed medications and no SGLT-2 or GLP-1 medications noted.

## 2024-02-27 ENCOUNTER — Ambulatory Visit (HOSPITAL_BASED_OUTPATIENT_CLINIC_OR_DEPARTMENT_OTHER)

## 2024-02-27 ENCOUNTER — Other Ambulatory Visit: Payer: Self-pay

## 2024-02-27 VITALS — BP 120/68 | HR 71 | Temp 97.1°F | Wt 133.0 lb

## 2024-02-27 DIAGNOSIS — D509 Iron deficiency anemia, unspecified: Secondary | ICD-10-CM | POA: Diagnosis present

## 2024-02-27 MED ORDER — SODIUM CHLORIDE 0.9 % IV SOLN
300.0000 mg | Freq: Once | INTRAVENOUS | Status: AC
Start: 2024-02-27 — End: 2024-02-27
  Administered 2024-02-27: 300 mg via INTRAVENOUS
  Filled 2024-02-27: qty 15

## 2024-02-27 NOTE — Progress Notes (Signed)
 Beth Hudson is a 78 year old female who attended Infusion Room today for planned #2/3 iron sucrose infusion, appeared well on arrival, no voiced questions or concerns. Vital signs stable, IV inserted as per flowsheet. Venofer 300mg  infused as per [facility] with no noted concerns, pt tolerated well, vitals signs remain stable. Discharge teaching completed, IV removed as per flowsheet, given AVS and aware of next appointments. Discharged patient, patient aware to call if any questions of concerns.

## 2024-03-01 ENCOUNTER — Ambulatory Visit: Payer: Medicare Other | Attending: Family Medicine

## 2024-03-01 VITALS — BP 144/61 | HR 66 | Temp 96.3°F | Wt 122.0 lb

## 2024-03-01 DIAGNOSIS — E039 Hypothyroidism, unspecified: Secondary | ICD-10-CM | POA: Insufficient documentation

## 2024-03-01 DIAGNOSIS — D509 Iron deficiency anemia, unspecified: Secondary | ICD-10-CM | POA: Insufficient documentation

## 2024-03-01 LAB — THYROID SCREEN TSH REFLEX FT4: THYROID SCREEN TSH REFLEX FT4: 1.95 u[IU]/mL (ref 0.270–4.200)

## 2024-03-01 NOTE — Progress Notes (Signed)
 Dear RN,    Please:    1. Create Telephone encounter for this patient.  2. Share with the patient the attached results: Her thyroid function tests are normal.    Plan:  1. She should continue at her current dose of levothyroxine. We will repeat thyroid tests in 6 months.    2. Type of Outreach: 1 phone call and if unable to reach send letter    3. Document the conversation in the Telephone Encounter and close the encounter, no need to send back to me.     Thank you,  Steffanie Dunn, MD

## 2024-03-01 NOTE — Progress Notes (Signed)
 PRECEPTOR NOTE:  On the day of the patient's visit, I discussed the key elements of history and physical exam and I reviewed the findings with the resident.  I agree with the assessment and plan as described in their documentation.  Please see resident's note for further details.

## 2024-03-01 NOTE — Progress Notes (Signed)
 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  HEENT: NCAT. Clear conjunctiva, no scleral icterus. EOMI.  Neck: Supple, trachea and spine midline, normal ROM.  Back: No obvious deformity.  MSK: Warm, moving four extremities independently, full active ROM, no peripheral edema or cyanosis.  Skin: Warm, dry, no rashes or lesions noted.  Neuro: AAO x 4. No focal deficits. Gait grossly normal.  Psych: Interactive with questions. Normal mood and affect. Judgement and thought content are normal.    Assessment & Plan:    1. Iron deficiency anemia, unspecified iron deficiency anemia type  Improved energy and RLS symptoms. Will plan to check CBC and iron studies at ~4 weeks after final infusion. Colonoscopy scheduled for Wednesday. 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    - CBC WITH PLATELET; Future  - IRON; Future  - TOTAL IRON BINDING CAPACITY; Future  - FERRITIN; Future    2. Hypothyroidism, unspecified type  Taking levothyroxine as prescribed. Checking TFTs for response today, titrate meds as needed.  - THYROID SCREEN TSH REFLEX FT4    1. The patient indicates understanding of these issues and agrees with  the plan.  2.  The patient is given an After Visit Summary sheet that lists all of their medications with directions, their allergies, orders placed during this encounter, immunization dates, and follow- up instructions.    Follow-up: CPEX in 3-4 months    Discussed with Dr. Tollie Eth, MD   PGY-2  03/01/24 10:44 AM

## 2024-03-01 NOTE — Patient Instructions (Signed)
 Please do the following after your visit:        Appointment: CPEX in 3-4 months with PCP or same team   If appointment not available: per patient preference, can schedule further out    Labs: main hallway

## 2024-03-02 ENCOUNTER — Telehealth (HOSPITAL_BASED_OUTPATIENT_CLINIC_OR_DEPARTMENT_OTHER): Payer: Self-pay

## 2024-03-02 NOTE — Telephone Encounter (Signed)
 Called placed to patient relayed message below.   Patient verbalized understanding and agreed with plan.   Denied any further questions or concerns at this time.  SAM, RN, 03/02/2024       Steffanie Dunn, MD  P [facility]  Dear RN,    Please:    1. Create Telephone encounter for this patient.  2. Share with the patient the attached results: Her thyroid function tests are normal.    Plan:  1. She should continue at her current dose of levothyroxine. We will repeat thyroid tests in 6 months.    2. Type of Outreach: 1 phone call and if unable to reach send letter    3. Document the conversation in the Telephone Encounter and close the encounter, no need to send back to me.    Thank you,  Steffanie Dunn, MD

## 2024-03-03 ENCOUNTER — Encounter (HOSPITAL_BASED_OUTPATIENT_CLINIC_OR_DEPARTMENT_OTHER): Payer: Self-pay

## 2024-03-03 ENCOUNTER — Other Ambulatory Visit (HOSPITAL_BASED_OUTPATIENT_CLINIC_OR_DEPARTMENT_OTHER)

## 2024-03-19 ENCOUNTER — Other Ambulatory Visit: Payer: Self-pay

## 2024-03-19 ENCOUNTER — Ambulatory Visit

## 2024-03-19 VITALS — BP 147/75 | HR 72

## 2024-03-19 DIAGNOSIS — D509 Iron deficiency anemia, unspecified: Secondary | ICD-10-CM

## 2024-03-19 MED ORDER — SODIUM CHLORIDE 0.9 % IV SOLN
300.0000 mg | Freq: Once | INTRAVENOUS | Status: AC
Start: 2024-03-19 — End: 2024-03-19
  Administered 2024-03-19: 300 mg via INTRAVENOUS
  Filled 2024-03-19: qty 15

## 2024-03-19 NOTE — Progress Notes (Signed)
 Beth Hudson is a 78 year old female who attended Infusion Room today for planned #3/3 iron  sucrose infusion, appeared well on arrival, no voiced questions or concerns. Vital signs stable, IV inserted as per flowsheet. Venofer  300mg  infused as per [facility] with no noted concerns, pt tolerated well, vitals signs remain stable. Discharge teaching completed, IV removed as per flowsheet, given AVS and aware of next appointments. Discharged patient, patient aware to call if any questions of concerns.

## 2024-04-14 IMAGING — MR MAO DIREITA
6 series · 40 of 40 positions shown · non-contrast
Comparison: none

[Series 2: localizar · axial · right · 5.0mm · 0.66mm/px · z∈[-25,+25]mm · 4 of 10 slices shown]
[im 1/10]
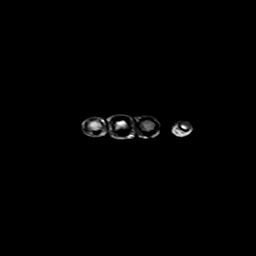
[im 4/10]
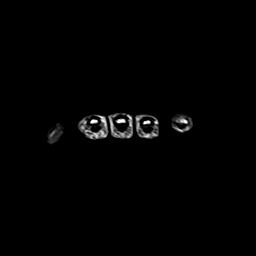
[im 7/10]
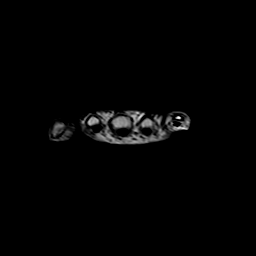
[im 10/10]
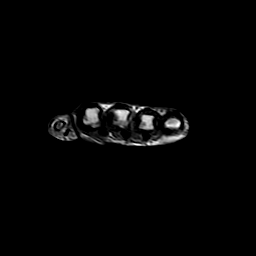

[Series 3: T1 · coronal · right · 2.5mm · 0.62mm/px · 5 of 12 slices shown (1 of 2)]
[im 1/12]
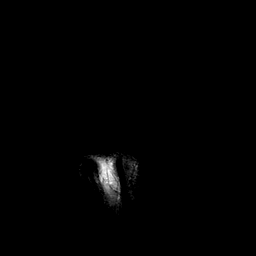
[im 3/12]
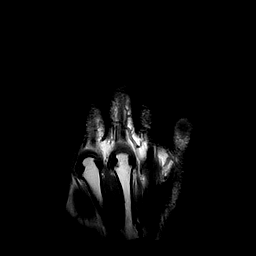
[im 6/12]
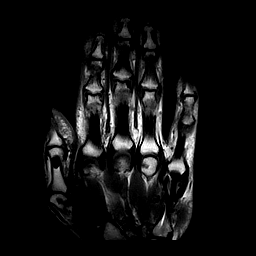
[im 9/12]
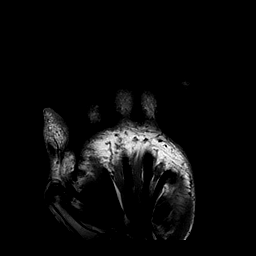
[im 12/12]
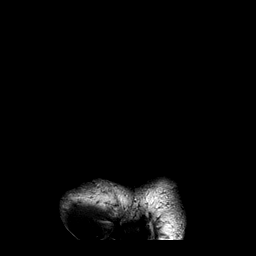

[Series 4: fstir coronal · coronal · right · 3.5mm · 0.55mm/px · 4 of 9 slices shown]
[im 1/9]
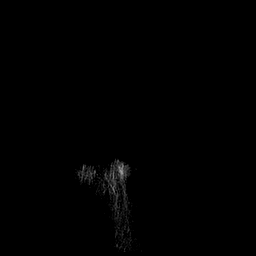
[im 3/9]
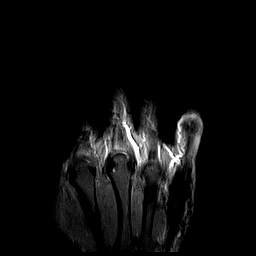
[im 6/9]
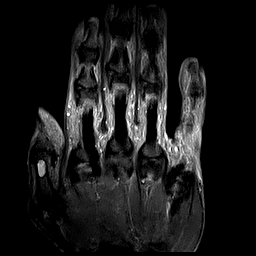
[im 9/9]
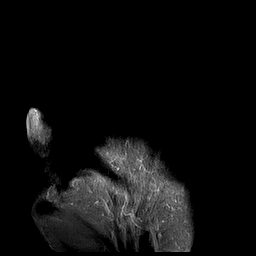

[Series 5: STIR · sagittal · right · 3.0mm · 0.27mm/px · 9 of 20 slices shown (1 of 2)]
[im 1/20]
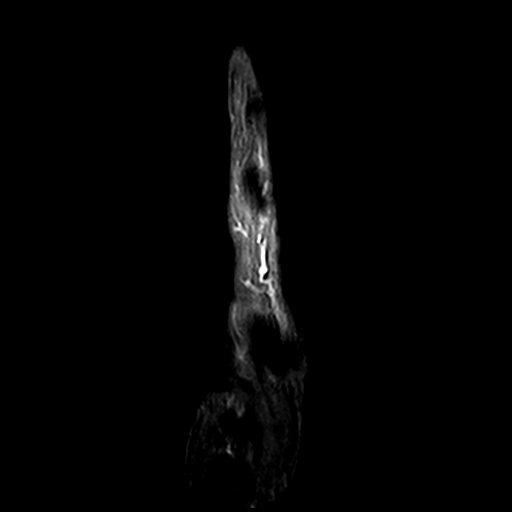
[im 3/20]
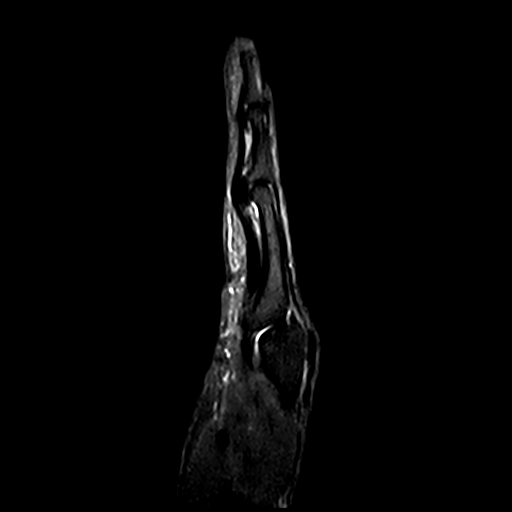
[im 5/20]
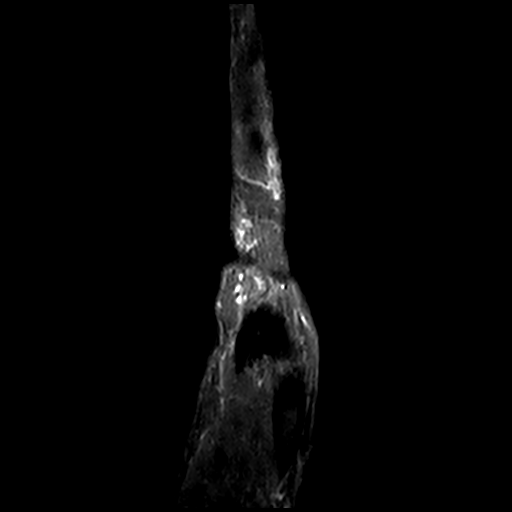
[im 8/20]
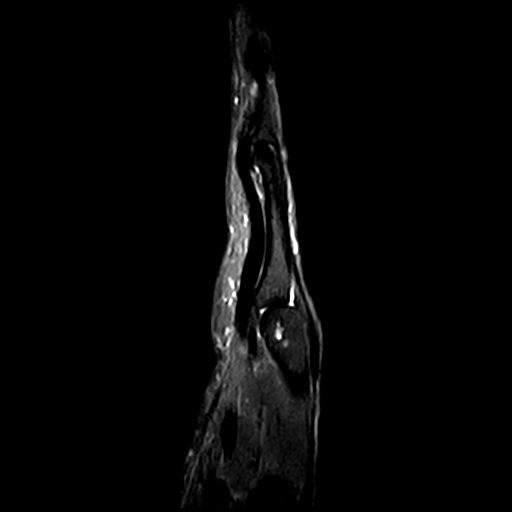
[im 10/20]
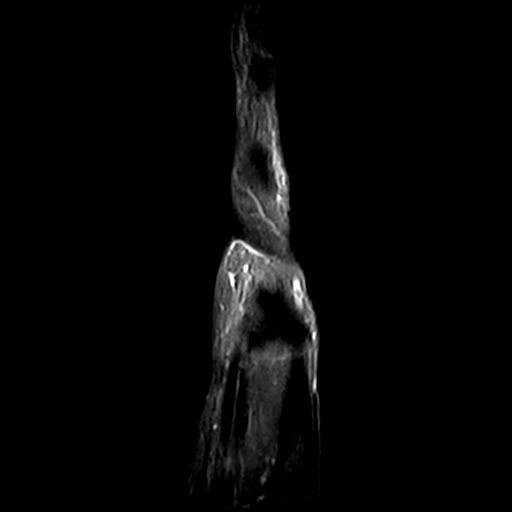
[im 12/20]
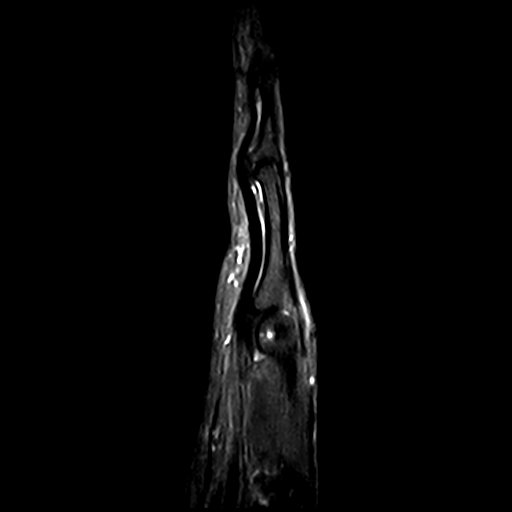
[im 15/20]
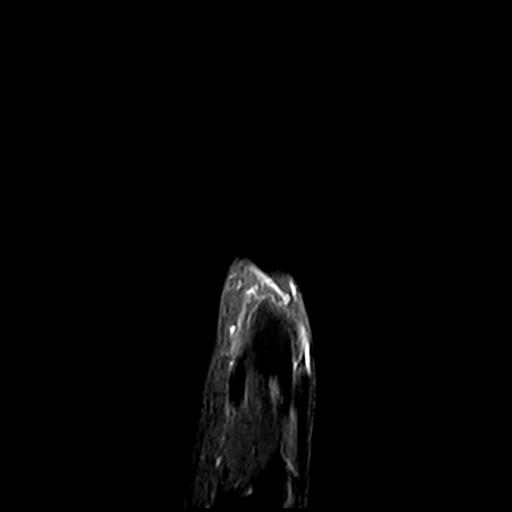
[im 17/20]
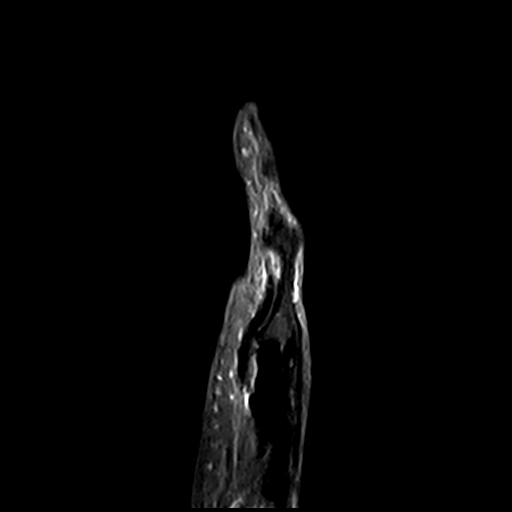
[im 20/20]
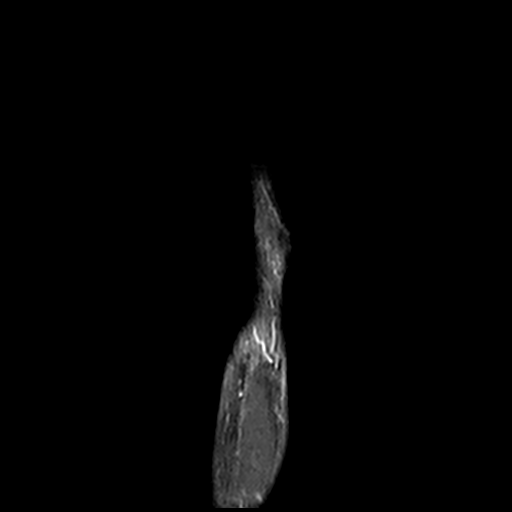

[Series 6: STIR · axial · right · 4.5mm · 0.55mm/px · z∈[-54,+60]mm · 9 of 20 slices shown (2 of 2)]
[im 1/20]
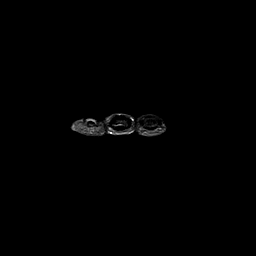
[im 3/20]
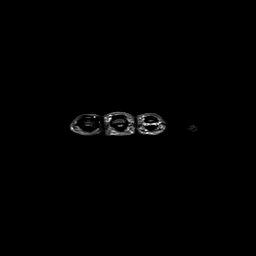
[im 5/20]
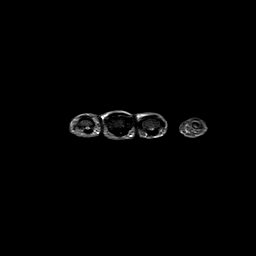
[im 8/20]
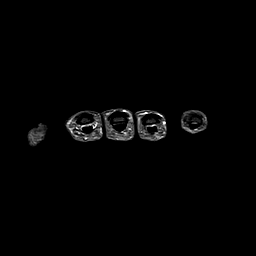
[im 10/20]
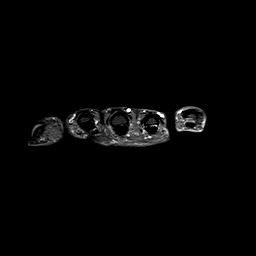
[im 12/20]
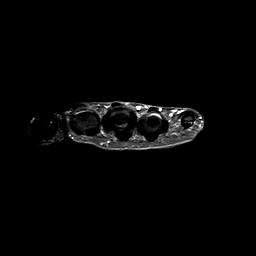
[im 15/20]
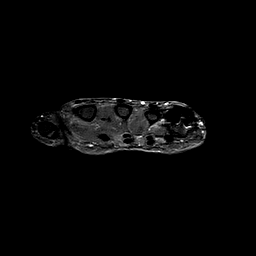
[im 17/20]
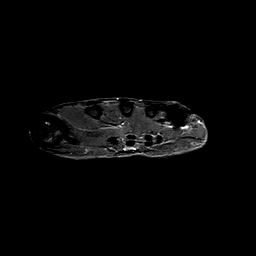
[im 20/20]
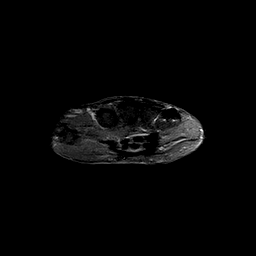

[Series 7: T1 · axial · right · 4.5mm · 0.59mm/px · z∈[-54,+60]mm · 9 of 20 slices shown (2 of 2)]
[im 1/20]
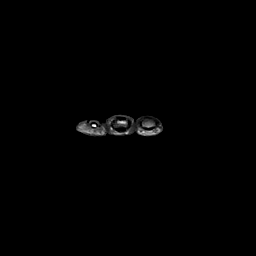
[im 3/20]
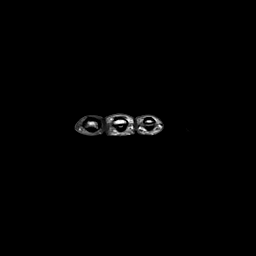
[im 5/20]
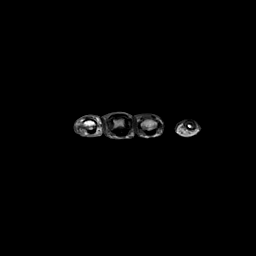
[im 8/20]
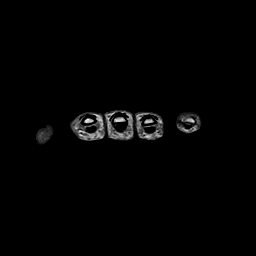
[im 10/20]
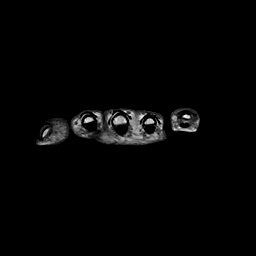
[im 12/20]
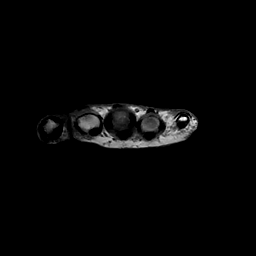
[im 15/20]
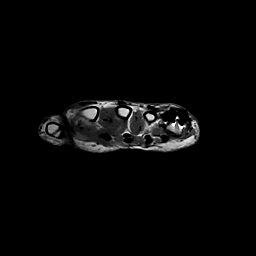
[im 17/20]
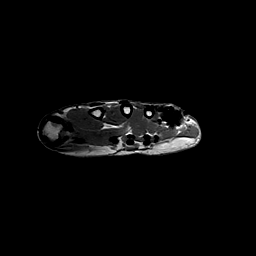
[im 20/20]
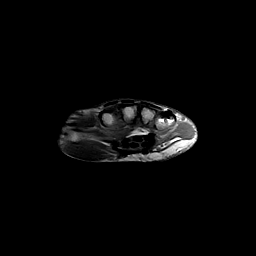

[40 of 40 positions shown; findings below may reference images not displayed]

RESSONÂNCIA MAGNÉTICA DA MÃO DIREITA

TÉCNICA:
Exame realizado em equipamento de ressonância magnética com sequências, ponderações e planos específicos para o segmento de interesse, sem a administração endovenosa do meio de contraste.

RESULTADO:
Presença de material de osteossíntese no quinto metacarpo, determinando artefatos de susceptibilidade magnética e prejudicando a avaliação das estruturas adjacentes. Existe aparente espessamento dos tendões extensores à distante, por possível tendinopatia.
Alinhamento ósseo preservado.
Cartilagens articulares de contornos e intensidade de sinal normais.
Ausência de derrame articular ou espessamento sinovial significativos.
Tendões flexores com forma, contornos, espessura e intensidade de sinal normais.
Regiões das polias flexoras sem anormalidades evidentes.
Ligamentos colaterais íntegros.
Ausência de lesões expansivas em partes moles periarticulares.
Planos musculares regionais com morfologia, trofismo e intensidade de sinal habituais.
Presença de imagem sugestiva de encondroma na medular do corpo do primeiro metacarpo, medindo 0,8 cm de diâmetro.
CONCLUSÃO:
Presença de material de osteossíntese no quinto metacarpo, determinando artefatos de susceptibilidade magnética e prejudicando a avaliação das estruturas adjacentes. Existe aparente espessamento dos tendões extensores à distante, por possível tendinopatia.
Presença de imagem sugestiva de encondroma na medular do corpo do primeiro metacarpo, medindo 0,8 cm de diâmetro.

## 2024-04-20 ENCOUNTER — Telehealth (HOSPITAL_BASED_OUTPATIENT_CLINIC_OR_DEPARTMENT_OTHER): Payer: Self-pay

## 2024-04-20 NOTE — Telephone Encounter (Signed)
 Called placed to patient relayed message below.   Patient verbalized understanding and agreed with plan.   Denied any further questions or concerns at this time.  SAM, RN, 04/20/2024     Barnet Lias, MD  [facility]  Hi,    Could we call Beth Hudson and remind her to come in to the lab this week for repeat anemia and iron  tests?    Thanks,  [employer]

## 2024-04-21 ENCOUNTER — Ambulatory Visit (HOSPITAL_BASED_OUTPATIENT_CLINIC_OR_DEPARTMENT_OTHER): Payer: Self-pay

## 2024-04-21 ENCOUNTER — Ambulatory Visit: Attending: Family Medicine

## 2024-04-21 DIAGNOSIS — D509 Iron deficiency anemia, unspecified: Secondary | ICD-10-CM | POA: Diagnosis present

## 2024-04-21 LAB — IRON: IRON: 41 ug/dL — ABNORMAL LOW (ref 50–170)

## 2024-04-21 LAB — CBC WITH PLATELET
ABSOLUTE NRBC COUNT: 0 10*3/uL (ref 0.0–0.0)
HEMATOCRIT: 36.6 % (ref 34.1–44.9)
HEMOGLOBIN: 11.3 g/dL (ref 11.2–15.7)
MEAN CORP HGB CONC: 30.9 g/dL — ABNORMAL LOW (ref 31.0–37.0)
MEAN CORPUSCULAR HGB: 25.9 pg — ABNORMAL LOW (ref 26.0–34.0)
MEAN CORPUSCULAR VOL: 83.8 fl (ref 80.0–100.0)
MEAN PLATELET VOLUME: 9.7 fL (ref 8.7–12.5)
NRBC %: 0 % (ref 0.0–0.0)
PLATELET COUNT: 288 10*3/uL (ref 150–400)
RBC DISTRIBUTION WIDTH STD DEV: 61.9 fL — ABNORMAL HIGH (ref 35.1–46.3)
RED BLOOD CELL COUNT: 4.37 M/uL (ref 3.90–5.20)
WHITE BLOOD CELL COUNT: 6.4 10*3/uL (ref 4.0–11.0)

## 2024-04-21 LAB — FERRITIN: FERRITIN: 97 ng/mL (ref 13–150)

## 2024-04-21 LAB — TOTAL IRON BINDING CAPACITY: TOTAL IRON BIND CAPACITY CALC: 370 ug/dL (ref 280–504)

## 2024-04-21 MED ORDER — FERROUS SULFATE 325 (65 FE) MG PO TABS
325.0000 mg | ORAL_TABLET | ORAL | 2 refills | Status: AC
Start: 2024-04-21 — End: 2024-07-20

## 2024-06-22 ENCOUNTER — Ambulatory Visit: Attending: Family Medicine

## 2024-06-22 ENCOUNTER — Telehealth (HOSPITAL_BASED_OUTPATIENT_CLINIC_OR_DEPARTMENT_OTHER): Payer: Self-pay | Admitting: Family Medicine

## 2024-06-22 VITALS — BP 189/64 | HR 60 | Temp 96.4°F | Wt 138.0 lb

## 2024-06-22 DIAGNOSIS — R030 Elevated blood-pressure reading, without diagnosis of hypertension: Secondary | ICD-10-CM | POA: Insufficient documentation

## 2024-06-22 DIAGNOSIS — B351 Tinea unguium: Secondary | ICD-10-CM | POA: Diagnosis present

## 2024-06-22 DIAGNOSIS — M5431 Sciatica, right side: Secondary | ICD-10-CM | POA: Diagnosis present

## 2024-06-22 MED ORDER — IBUPROFEN 200 MG PO TABS
200.0000 mg | ORAL_TABLET | Freq: Four times a day (QID) | ORAL | 1 refills | Status: AC | PRN
Start: 2024-06-22 — End: 2024-07-22

## 2024-06-22 MED ORDER — LIDOCAINE 5 % EX PTCH
3.0000 | MEDICATED_PATCH | CUTANEOUS | 1 refills | Status: AC
Start: 2024-06-22 — End: 2024-08-21

## 2024-06-22 MED ORDER — ACETAMINOPHEN 500 MG PO TABS
1000.0000 mg | ORAL_TABLET | Freq: Three times a day (TID) | ORAL | 1 refills | Status: AC | PRN
Start: 2024-06-22 — End: 2024-07-25

## 2024-06-22 NOTE — Telephone Encounter (Signed)
 Magda Perkins, MD  [facility]  Hi RN team -- I realized after this patient left her visit today that her blood pressure was quite high.  Could you reach out to her to see if she has any sx of severe hypertension and help her make a f/u appt w/ PCP or any available provider in the next 1-2 weeks to discuss blood pressure management?  Thank you!! Perkins [employer] and spoke with patient.     Patient denied any headaches, changes in vision, chest pain, chest pressure, or shortness of breath.     Scheduled in-person appointment for 06/30/2024.

## 2024-06-22 NOTE — Progress Notes (Signed)
Preceptor Note  I personally reviewed this case, along with the patient's history and exam findings, with Dr. Alvie Heidelberg. I confirm the findings, and agree with the assessment and plan, as documented in the visit note.    Santo Held, DO

## 2024-06-22 NOTE — Progress Notes (Signed)
 [facility] FAMILY MEDICINE  Office Visit Note     Subjective:   Beth Hudson is a 78 year old female patient with a history of carotid artery stenosis, HLD who presents today for: Patient presents with:  Leg Pain: Right sciatic pain       #Sciatica  - previously had in L leg last year  - now 2 weeks ago started to have in R leg -- from lower R buttock down back of thigh  - no trauma  - have tried heat, Aleve, motrin , aspercream, Bengay  - standing makes it worse; walking a lot makes it better but then if sit down and get up it's worse  - going down stairs hurts but doing up doesn't  - not as bad as previous sciatica  - no numbness or tingling -- sometimes feels off-balance first few steps  - no urinary or bowel issues  - works at [employer], usually standing    #Toenail concern  - both big toenails are black    Medications, Allergies, MedHx, SocHx & FamHx reviewed, current in Epic.    Current Outpatient Medications   Medication Instructions    atorvastatin  (LIPITOR) 40 mg, Oral, DAILY    ferrous sulfate  325 mg, Oral, EVERY OTHER DAY    latanoprost (XALATAN) 0.005 % ophthalmic solution INSTILL 1 DROP INTO BOTH EYES AT BEDTIME    levothyroxine  (SYNTHROID ) 75 mcg, Oral, Every morning       Objective:   BP 189/64   Pulse 60   Temp 96.4 F (35.8 C)   Wt 62.6 kg (138 lb)   SpO2 99%   BMI 21.61 kg/m   General: Appears stated age, in no acute distress, answers questions appropriately, cooperative with exam.  HEENT: NCAT. Clear conjunctiva, no scleral icterus.  Lungs: Non-labored respirations.  Back: No obvious deformity.  MSK: Warm, moving four extremities independently, full active ROM.  Back: No obvious deformity.  No midline tenderness.  +TTP over bilateral SI joints, R>L, and R lumbar paraspinal muscles.  Negative slump test.  No pain w/ back extension. Positive straight leg raise test on right.  No hip pain w/ external or internal rotation.  +TTP over posterior R thigh, knee, and calf.    Feet: Pedal pulses intact.  Both great toes w/ thickened and discolored toenails w/ dark-colored subungual hematomas.  Skin: Warm, dry, no rashes or lesions noted.  Neuro: AAO x 4. No focal deficits. Gait grossly normal.  Psych: Interactive with questions. Normal mood and affect. Judgement and thought content are normal.            Assessment & Plan:    Beth Hudson is a 78 year old female patient who presents with:    (M54.31) Sciatica, right side  (primary encounter diagnosis)  Comment: Patient w/ h/o L sided-sciatica, now w/ new R sided sciatica x2 weeks.  Most c/w piriformis syndrome as pain starts in lower buttock, not back.  No red flag sx.  Will treat w/ pain management and refer to PT.  Plan: REFERRAL TO PHYSICAL THERAPY (INT), lidocaine          (LIDODERM ) 5 % patch, acetaminophen  (TYLENOL )         500 MG tablet, ibuprofen  (ADVIL ) 200 MG tablet    (B35.1) Onychomycosis  Comment: Patient would like treatment for onychomycosis so will get LFTs and patient can RTC in one month to start treatment and ongoing LFT monitoring.  Plan: HEPATIC FUNCTION PANEL    (R03.0) Elevated BP without diagnosis  of hypertension  Comment: Noted after patient left visit today; also elevated in the past.  Will ask RN team to reach out to patient to schedule for sooner PCP f/u to discuss ?HTN dx  Plan: f/u in 2 weeks        1. The patient indicates understanding of these issues and agrees with the plan.  2.  The patient is given an After Visit Summary sheet that lists all of their medications with directions, their allergies, orders placed during this encounter, immunization dates, and follow- up instructions.  3. I reviewed the patient's medical information and medical history   4.  I reconciled the patient's medication list and prepared and supplied needed refills.  5.  I have reviewed the past medical, family, and social history sections including the medications and allergies listed in the above medical record      Damien Rough, MD   PGY-2  06/22/24 11:48  AM        Next time:  - check BP -- likely has undiagnosed HTN  - treatment for onychomycosis  - f/u sciatica

## 2024-06-22 NOTE — Patient Instructions (Addendum)
 Take 2 pills of tylenol  3 times a day no matter what  Then take 1-2 pills of ibuprofen  (Motrin ) as needed, up to every 6 hours  Apply lidocaine  patches (if not covered by insurance, you can get these over the counter)    Please do the following after your visit:      Appointment: f/u in one month with PCP for sciatica       Referrals: PHYSICAL THERAPY, OCC THERAPY, SPEECH THERAPY  [phone]

## 2024-06-23 ENCOUNTER — Ambulatory Visit (HOSPITAL_BASED_OUTPATIENT_CLINIC_OR_DEPARTMENT_OTHER): Payer: Self-pay

## 2024-06-23 DIAGNOSIS — R030 Elevated blood-pressure reading, without diagnosis of hypertension: Secondary | ICD-10-CM

## 2024-06-23 LAB — BASIC METABOLIC PANEL
ANION GAP: 14 mmol/L (ref 10–22)
BUN (UREA NITROGEN): 22 mg/dL — ABNORMAL HIGH (ref 7–18)
CALCIUM: 9.9 mg/dL (ref 8.5–10.5)
CARBON DIOXIDE: 22 mmol/L (ref 21–32)
CHLORIDE: 108 mmol/L — ABNORMAL HIGH (ref 98–107)
CREATININE: 0.8 mg/dL (ref 0.4–1.2)
ESTIMATED GLOMERULAR FILT RATE: >60 mL/min (ref >60)
Glucose Random: 103 mg/dL (ref 74–160)
POTASSIUM: 4.6 mmol/L (ref 3.5–5.1)
SODIUM: 144 mmol/L (ref 136–145)

## 2024-06-23 LAB — HEPATIC FUNCTION PANEL
ALANINE AMINOTRANSFERASE: 31 U/L (ref 12–45)
ALBUMIN: 4.6 g/dL (ref 3.4–5.2)
ALKALINE PHOSPHATASE: 97 U/L (ref 45–117)
ASPARTATE AMINOTRANSFERASE: 26 U/L (ref 8–34)
BILIRUBIN DIRECT: <0.1 mg/dL (ref 0.0–0.2)
BILIRUBIN TOTAL: 0.2 mg/dL (ref 0.2–1.0)
TOTAL PROTEIN: 7.4 g/dL (ref 6.4–8.2)

## 2024-06-24 ENCOUNTER — Telehealth (HOSPITAL_BASED_OUTPATIENT_CLINIC_OR_DEPARTMENT_OTHER): Payer: Self-pay

## 2024-06-24 NOTE — Telephone Encounter (Signed)
 Central refill received an Rx for Lidocaine  patches    Medication is only approved for the following diagnosis:    1. Is the requested drug being prescribed for any of the following:   A) pain associated with post-herpetic neuralgia,   B) pain associated with diabetic neuropathy,   C) pain associated with cancer-related neuropathy (including treatment-related neuropathy [e.g., neuropathy associated with radiation treatment or chemotherapy])?     Patient does have the option to pay 340 cash price at our [employer] or OTC

## 2024-06-30 ENCOUNTER — Ambulatory Visit: Attending: Family Medicine

## 2024-06-30 VITALS — BP 130/63 | HR 67 | Temp 97.6°F | Wt 139.6 lb

## 2024-06-30 DIAGNOSIS — B351 Tinea unguium: Secondary | ICD-10-CM | POA: Insufficient documentation

## 2024-06-30 DIAGNOSIS — R030 Elevated blood-pressure reading, without diagnosis of hypertension: Secondary | ICD-10-CM | POA: Diagnosis present

## 2024-06-30 MED ORDER — TERBINAFINE HCL 250 MG PO TABS
250.0000 mg | ORAL_TABLET | Freq: Every day | ORAL | 0 refills | Status: DC
Start: 2024-06-30 — End: 2024-06-30

## 2024-06-30 MED ORDER — OTHER MEDICATION
1.0000 | Freq: Every day | 0 refills | Status: DC | PRN
Start: 2024-06-30 — End: 2024-06-30

## 2024-06-30 MED ORDER — OTHER MEDICATION
1.0000 | Freq: Every day | 0 refills | Status: AC | PRN
Start: 2024-06-30 — End: 2025-06-30

## 2024-06-30 MED ORDER — TERBINAFINE HCL 250 MG PO TABS
250.0000 mg | ORAL_TABLET | Freq: Every day | ORAL | 0 refills | Status: AC
Start: 2024-06-30 — End: 2024-09-22

## 2024-06-30 NOTE — Progress Notes (Signed)
 PRECEPTOR NOTE:  On the day of the patient's visit, I discussed the key elements of history and physical exam and I reviewed the findings with Dr. Eugene Hertz.  I agree with the assessment and plan as described in their documentation.  Please see their note for further details.

## 2024-06-30 NOTE — Progress Notes (Signed)
 [facility] FAMILY MEDICINE  Office Visit Note     Subjective:   Beth Hudson is a 78 year old female patient who presents today for: Patient presents with:  Blood Pressure: F/U  Derm Problem: RT FT big toe infection.     #blood pressure  - f/u on bp  - no hx of htn  - today in clinic, normotensive  - no smoking or etoh  - endorses good diet, not too salty  - working at [employer]  - no concerns about stress or anxiety  - great family support  - no coffee    - no chest pain/headaches/vision changes    #toe fungal infection  - would like treatment of onychomycosis  - both big toes   - previously had toe nail fall off    #sciatica  - significantly improved  - no longer having pain in hip or thigh   - continuing to daily movement and walking    No interpreter was required during this encounter.     ROS    Patient Active Problem List:     Unspecified hypothyroidism     Epistaxis     Carpal tunnel syndrome     Disorder of bone and cartilage, unspecified     Family health problem     Second hand smoke exposure     Lichen planus      [facility]) [phone] Option #4     Colon polyps, adenomatous     Angiodysplasia of cecum     Acute pain of both shoulders     Arthritis     Carotid artery stenosis     Cataract     Encounter for screening for malignant neoplasm of colon     Stress incontinence of urine     Hyperglycemia     Hyperlipidemia     Right facial pain     Syncope     Incontinence of feces     Elevated BP without diagnosis of hypertension     Chronic left-sided low back pain with left-sided sciatica     Iron  deficiency anemia      MEDS:    Current Outpatient Medications:     OTHER MEDICATION, Use 1 each As Directed daily as needed Blood Pressure Monitoring Kit  Arm Circumference:Regular adult (mid-arm circumference 27-34 cm)  For use as directed: Please Dispense AMA validated cuff see [email or web address] (such as [employer])    DX: Elevated BP without diagnosis of hypertension ICD10: R03.0, Disp: 1 each,  Rfl: 0    terbinafine  (LAMISIL ) 250 MG tablet, Take 1 tablet by mouth daily, Disp: 84 tablet, Rfl: 0    lidocaine  (LIDODERM ) 5 % patch, Place 3 patches onto the skin daily . Patch(es) Fifi Schindler remain in place for up to 12 hours in any 24-hour period., Disp: 90 patch, Rfl: 1    acetaminophen  (TYLENOL ) 500 MG tablet, Take 2 tablets by mouth every 8 (eight) hours as needed for Pain, Disp: 100 tablet, Rfl: 1    ibuprofen  (ADVIL ) 200 MG tablet, Take 1-2 tablets by mouth every 6 (six) hours as needed for Pain, Disp: 100 tablet, Rfl: 1    ferrous sulfate  325 (65 FE) MG tablet, Take 1 tablet by mouth every other day, Disp: 15 tablet, Rfl: 2    levothyroxine  (SYNTHROID ) 75 MCG tablet, Take 1 tablet by mouth in the morning, Disp: 90 tablet, Rfl: 3    atorvastatin  (LIPITOR) 40 MG tablet, Take 1 tablet by mouth in the  morning., Disp: 90 tablet, Rfl: 3    latanoprost (XALATAN) 0.005 % ophthalmic solution, INSTILL 1 DROP INTO BOTH EYES AT BEDTIME, Disp: , Rfl:     Review of Patient's Allergies indicates:   Duloxetine               Dizziness, Other (See Comments)    Comment:Altered mental status   Strawberry              Rash    Comment:Unable to eat fresh strawberries, ok with the rest    Objective:   BP 130/63   Pulse 67   Temp 97.6 F (36.4 C) (Temporal)   Wt 63.3 kg (139 lb 9.6 oz)   SpO2 99%   BMI 21.86 kg/m   Physical Exam  Constitutional:       Appearance: Normal appearance.   Cardiovascular:      Rate and Rhythm: Normal rate.      Pulses: Normal pulses.      Heart sounds: Normal heart sounds.   Pulmonary:      Effort: Pulmonary effort is normal. No respiratory distress.      Breath sounds: Normal breath sounds.   Abdominal:      General: Abdomen is flat. Bowel sounds are normal. There is no distension.      Palpations: Abdomen is soft.   Skin:     General: Skin is warm.      Comments: Both great toes w/ thickened and discolored toenails w/ dark-colored subungual hematomas distributed throughout the entire toe nail    Neurological:      General: No focal deficit present.      Mental Status: She is alert.        Assessment & Plan:    [name] is a 78 year old female who presented to clinic today for Patient presents with:  Blood Pressure: F/U  Derm Problem: RT FT big toe infection.     1. Elevated BP without diagnosis of hypertension  Normotensive in clinic. Will continue healthy habits. Given script for BP cuff to start bp logging at home   - Will bring BP log back to clinic at follow up appointment  - OTHER MEDICATION; Use 1 each As Directed daily as needed Blood Pressure Monitoring Kit   Arm Circumference:Regular adult (mid-arm circumference 27-34 cm)   For use as directed: Please Dispense AMA validated cuff see [email or web address] (such as [employer])       DX: Elevated BP without diagnosis of hypertension  ICD10: R03.0  Dispense: 1 each; Refill: 0    2. Onychomycosis  Starting treatment with daily terbinafine . Baseline labs wnl. Will repeat LFTs in 1 month at follow up appointment.   - HEPATIC FUNCTION PANEL; Future  - terbinafine  (LAMISIL ) 250 MG tablet; Take 1 tablet by mouth daily  Dispense: 84 tablet; Refill: 0    Follow-up:   [ ]  repeat LFTs in 1 month    I have reviewed the past medical, family, and social history sections including the medications and allergies listed in the above medical record. I reconciled the patient's medication list and prepared and supplied needed refills. The patient indicates understanding of the above issues and agrees with the plan. The patient has been given an After Visit Summary sheet that lists all of their medications with directions, their allergies, orders placed during this encounter, immunization dates, and follow- up instructions.      Discussed with Dr. Shelbie Ann, MD.    Lynnann Cowing, MD  PGY-2,  Family Medicine 06/30/2024

## 2024-06-30 NOTE — Patient Instructions (Signed)
 Please do the following after your visit:      Appointment:     Scheduled Appointments      Aug 05, 2024 1:00 PM  (Arrive by 12:45 PM)  Established Patient with Beth Dingwall, MD  [facility]) [address]  [address]  [place] [address]  [phone]      Please arrive 15 minutes prior to your appointment for Registration process

## 2024-07-01 NOTE — Telephone Encounter (Signed)
 Tried calling the patient    Patient keeps hanging up    Please be advised

## 2024-07-07 ENCOUNTER — Other Ambulatory Visit (HOSPITAL_BASED_OUTPATIENT_CLINIC_OR_DEPARTMENT_OTHER): Payer: Self-pay

## 2024-07-07 DIAGNOSIS — R030 Elevated blood-pressure reading, without diagnosis of hypertension: Secondary | ICD-10-CM

## 2024-07-07 MED ORDER — OTHER MEDICATION
1.0000 | Freq: Every day | 0 refills | Status: AC | PRN
Start: 2024-07-07 — End: 2025-07-07

## 2024-07-13 ENCOUNTER — Telehealth (HOSPITAL_BASED_OUTPATIENT_CLINIC_OR_DEPARTMENT_OTHER): Payer: Self-pay

## 2024-07-13 NOTE — Telephone Encounter (Signed)
 Called and spoke to pt, notified pt of message from central refill. Pt states she would like to make Dr. Lucillie aware that pain medication prescribed at last visit has been ineffective. Pt states she is not getting any relief. Notified pt will route message to provider for further advisement. Pt verbalizes understanding and agrees with plan.

## 2024-07-13 NOTE — Telephone Encounter (Signed)
 Hello,  [facility] received an RX for a blood pressure monitor for this patient. Please note we are able to fill this through our [facility] outpatient pharmacy.     They can reach out to the pharmacy directly to schedule pick-up or delivery.    Thank you

## 2024-07-14 NOTE — Telephone Encounter (Signed)
 Called and spoke to pt. Pt states she was prescribed acetaminophen  and ibuprofen  for sciatic pain. Pt reports continues to have the same pain that she has already reported to providers, it is intermittent but can be very painful and medication doesn't help when pain comes on. In review of chart, pt scheduled for f/u sciatic pain 9/11. Assisted pt w/ scheduling sooner appt 08/23. Discussed w/ pt home care advice. Pt verbalizes understanding and agrees with plan.         [name], MD to [facility]    07/14/24 11:28 AM  Hi Beth Hudson,    Thank you for the heads up. I took a look at our last visit, it doesn't appear that I prescribed any pain medication, only antifungal medication. Could you please call her back for triage regarding this pain to see if she should be seen sooner than her currently scheduled outpatient follow up here on 9/11?    Thank you so much for your help,  May

## 2024-07-16 NOTE — Progress Notes (Signed)
 I did not see the patient in person, but I personally reviewed this case, including the pertinent medical history, along with Dr. Margeret. I confirm the findings, and agree with the assessment and plan, as documented in the visit note.    Briefly this is a 78 year old woman with PMH of hypothyroidism, anemia, HLD, IDA, episodes of epistaxis requiring cauterization, adenomatous polyps, stress urinary incontinence, osteopenia  Over last month - pain left side, that originates in buttock and goes down to leg; pain with sitting, not able to sleep; no bladder or bowel incontinence; minimal relief with Tylenol     Saw Dr. Magda 06/22/2024 for back pain with sciatica (describing pain traveling from lower buttock down back of thigh)- piriformis syndrome; referred to PT; given lidocaine  patches and ibuprofen     Then seen with Dr. Lucillie on 06/30/2024 - prescribed blood pressure cuff; started terbinafine        Plan:  -PT; exercises for piriformis syndrome  -Gabapentin  100 mg TID  -FU in two weeks for BP check and re-evaluation of left sided sciatica; repeat LFTs (will be about 1 month since starting terbinafine )

## 2024-07-17 ENCOUNTER — Ambulatory Visit: Attending: Family Medicine

## 2024-07-17 VITALS — BP 147/69 | HR 70 | Temp 97.0°F | Wt 138.0 lb

## 2024-07-17 DIAGNOSIS — M5431 Sciatica, right side: Secondary | ICD-10-CM | POA: Diagnosis present

## 2024-07-17 DIAGNOSIS — R030 Elevated blood-pressure reading, without diagnosis of hypertension: Secondary | ICD-10-CM | POA: Insufficient documentation

## 2024-07-17 MED ORDER — GABAPENTIN 100 MG PO CAPS
100.0000 mg | ORAL_CAPSULE | Freq: Three times a day (TID) | ORAL | 0 refills | Status: DC
Start: 2024-07-17 — End: 2024-07-29

## 2024-07-17 NOTE — Patient Instructions (Signed)
 Please do the following after your visit:      Referrals: PHYSICAL THERAPY, OCC THERAPY, SPEECH THERAPY  [phone]

## 2024-07-17 NOTE — Progress Notes (Signed)
 [facility] FAMILY MEDICINE  Office Visit Note     Subjective:   Beth Hudson is a 78 year old female patient who presents today for:     #R sciatica pain  - Was better but restarted in pain 1 mo ago  - Radiating down the leg, originates in the buttock, lands in the back of the knee and the calf  - Not sleeping   - Feels like her achilles is being squeezed  - No falls  - Taking Ibuprofen  400mg  q4-6h and Tylenol  1000mg  q8h  - Pain is better with sitting, worse with walking  - No bladder/bowel incontinence     Objective:   BP 147/69   Pulse 70   Temp 97 F (36.1 C) (Temporal)   Wt 62.6 kg (138 lb)   SpO2 98%   BMI 21.61 kg/m   General: Appears stated age, in no acute distress, answers questions appropriately.  HEENT: NCAT. Clear conjunctiva, no scleral icterus. EOMI.   MSK: Warm, moving four extremities independently. No peripheral edema, redness, warmth.   Skin: Warm, dry, no rashes or lesions noted.  Neuro: No focal deficits. Gait grossly normal.  Psych: Interactive with questions. Normal mood and affect. Judgement and thought content are normal.    Assessment & Plan:    Beth Hudson is a 78 year old female patient who presents today for:     1. Sciatica, right side  R sided sciatica that initially improved with tylenol  and ibuprofen  but now worsened again. Pain is initiating from the buttocks raising suspicion for piriformis syndrome. We reviewed imaging of the piriformis muscle and sciatic nerve, and discussed that the mainstay of treatment is PT-- referral was placed at previous visit, and I encouraged her to schedule. Exercises were provided to try at home. Plan to continue ibuprofen , tylenol , and start gabapentin  100mg  TID. Counseled on side effects including dizziness and somnolence; plan to trial at home this weekend.  - gabapentin  (NEURONTIN ) 100 MG capsule; Take 1 capsule by mouth 3 (three) times daily  for 14 days  Dispense: 42 capsule; Refill: 0    2. Elevated BP without diagnosis of hypertension  BP  slightly elevated today, but at goal at her last visit and home BP cuff was ordered. Suspect there is a component of white coat hypertension. Deferred today, has appt in 2 weeks and can recheck then and ensure BP cuff was delivered.     Follow-up: appt scheduled in 2 weeks -- can check in then    This plan was discussed with Dr. Del.    Lavi Sheehan, MD  07/17/24

## 2024-07-28 ENCOUNTER — Other Ambulatory Visit (HOSPITAL_BASED_OUTPATIENT_CLINIC_OR_DEPARTMENT_OTHER): Payer: Self-pay

## 2024-07-28 DIAGNOSIS — M5431 Sciatica, right side: Secondary | ICD-10-CM

## 2024-07-28 NOTE — Telephone Encounter (Signed)
 PER who is calling: Pharmacy, Beth Hudson is a 78 year old female has requested a refill of gabapentin .      Last Office Visit: 07/17/2024 Prifti, Logan, MD     Last Physical Exam: 10/18/2015    COLONOSCOPY due on 10/01/2019    Other Med Adult:  Most Recent BP Reading(s)  07/17/24 : 147/69        Cholesterol (mg/dL)   Date Value   94/75/7976 205     LOW DENSITY LIPOPROTEIN DIRECT (mg/dL)   Date Value   94/75/7976 123     HIGH DENSITY LIPOPROTEIN (mg/dL)   Date Value   94/75/7976 47     TRIGLYCERIDES (mg/dL)   Date Value   94/75/7976 283 (H)         THYROID  SCREEN TSH REFLEX FT4 (uIU/mL)   Date Value   03/01/2024 1.950         TSH (THYROID  STIM HORMONE) (uIU/mL)   Date Value   10/05/2022 51.800 (H)       HEMOGLOBIN A1C (%)   Date Value   04/17/2022 5.7 (H)       No results found for: POCA1C      No results found for: INR    SODIUM (mmol/L)   Date Value   06/22/2024 144       POTASSIUM (mmol/L)   Date Value   06/22/2024 4.6           CREATININE (mg/dL)   Date Value   92/70/7974 0.8       Documented patient preferred pharmacies:    [facility] [phone] - [place], [place] - [address] AT [facility] OF [address]  Phone: [phone] Fax: [phone]

## 2024-08-05 ENCOUNTER — Ambulatory Visit

## 2024-08-05 VITALS — BP 114/62 | HR 91 | Temp 97.1°F | Wt 142.0 lb

## 2024-08-05 DIAGNOSIS — B351 Tinea unguium: Secondary | ICD-10-CM | POA: Insufficient documentation

## 2024-08-05 NOTE — Progress Notes (Signed)
 Preceptor Note  I personally interviewed and examined this patient, along with Dr. Theopolis. I confirm the key elements of the history and physical, and agree with the assessment and plan, as documented in the visit note.    Damien Mattock, MD

## 2024-08-05 NOTE — Progress Notes (Deleted)
 Preceptor Note  I personally interviewed and examined this patient, along with Dr. Theopolis. I confirm the key elements of the history and physical, and agree with the assessment and plan, as documented in the visit note.    Damien Mattock, MD

## 2024-08-05 NOTE — Progress Notes (Signed)
 [facility] FAMILY MEDICINE  Office Visit Note   Patient presents with:  Follow Up    Subjective:   Beth Hudson is a 78 year old female patient who presents today for follow up for R sided sciatica and onychomycosis of both great toe nails.    #onychomycosis   Patient started Terbinafine  6 weeks ago and reports no improvement in appearance of her great toe nails.  #Sciatica  Patient recently completed two week course of gabapentin  100 mg TID  Reports significantly improved symptoms  No pain at this time  Reports improvement with walking   Performs PT exercises at home frequently     ROS: see HPI   Re    Medications, Allergies, FHx and Social Hx reviewed in Epic and current.    Problem List:   Patient Active Problem List:     Unspecified hypothyroidism     Epistaxis     Carpal tunnel syndrome     Disorder of bone and cartilage, unspecified     Family health problem     Second hand smoke exposure     Lichen planus      [facility]) [phone] Option #4     Colon polyps, adenomatous     Angiodysplasia of cecum     Acute pain of both shoulders     Arthritis     Carotid artery stenosis     Cataract     Stress incontinence of urine     Hyperglycemia     Hyperlipidemia     Right facial pain     Syncope     Incontinence of feces     Elevated BP without diagnosis of hypertension     Chronic left-sided low back pain with left-sided sciatica     Iron  deficiency anemia      Objective:   BP 114/62   Pulse 91   Temp 97.1 F (36.2 C) (Temporal)   Wt 64.4 kg (142 lb)   SpO2 99%   BMI 22.24 kg/m   General: alert, appears stated age, and cooperative  HEENT:  EOMI. Conjunctiva clear.  No icterus.  Oropharynx without erythema, edema or exudate.    Pulmonary: breathing comfortably on room air    Abd: soft, non-tender. No masses,  no organomegaly  Ext: no cyanosis or edema. Discoloration, thickening of left toenail, visible hematoma. Thickening of right toenail with discoloration. Appearance of new nail  visible.   Skin: skin color, texture, turgor are normal, No rashes or lesions  Psych: Interactive, Normal Mood   Neuro: Alert and oriented X 3. Strength grossly intact. Negative straight leg raise bilaterally. Normal coordination and gait.    Assessment & Plan:        Assessment & Plan  Onychomycosis  Patient on week 6 of 12 week course of Terbinafine  for toenail fungal treatment. Reports no improvement in appearance. Exam notable for bilateral thickening and discoloration of great toe nails bilaterally with evidence of new nail appearing. Cultures of nail taken at this visit. Recent LFT's wnl.  Patient instructed to continue 6 more weeks of Terbinafine  and return for evaluation following completion of regimen. Discussed itraconazole or Podiatry referral if symptoms not improved at next visit. Obtain LFT's at follow up.   Orders:    FUNGAL CULTURE          _____________________    1. The patient indicates understanding of these issues and agrees with the plan.  2.  The patient is given an After  Visit Summary sheet that lists all of their medications with directions, their allergies, orders placed during this encounter, immunization dates, and follow- up instructions.  3. I reviewed the patient's medical information and medical history   4.  I reconciled the patient's medication list and prepared and supplied needed refills.  5.  I have reviewed the past medical, family, and social history sections including the medications and allergies listed in the above medical record    Leida Dingwall, MD, PGY-1  08/05/24 2:11 PM

## 2024-09-08 ENCOUNTER — Ambulatory Visit (HOSPITAL_BASED_OUTPATIENT_CLINIC_OR_DEPARTMENT_OTHER): Payer: Self-pay

## 2024-09-08 DIAGNOSIS — B351 Tinea unguium: Secondary | ICD-10-CM

## 2024-09-08 LAB — FUNGAL CULTURE

## 2024-09-29 ENCOUNTER — Ambulatory Visit

## 2024-09-29 ENCOUNTER — Other Ambulatory Visit: Payer: Self-pay

## 2024-09-29 ENCOUNTER — Ambulatory Visit (HOSPITAL_BASED_OUTPATIENT_CLINIC_OR_DEPARTMENT_OTHER): Payer: Self-pay

## 2024-09-29 VITALS — BP 126/70 | HR 86 | Temp 97.2°F | Wt 142.0 lb

## 2024-09-29 DIAGNOSIS — M5442 Lumbago with sciatica, left side: Secondary | ICD-10-CM | POA: Diagnosis not present

## 2024-09-29 DIAGNOSIS — G8929 Other chronic pain: Secondary | ICD-10-CM | POA: Insufficient documentation

## 2024-09-29 DIAGNOSIS — B351 Tinea unguium: Secondary | ICD-10-CM | POA: Diagnosis not present

## 2024-09-29 DIAGNOSIS — I6522 Occlusion and stenosis of left carotid artery: Secondary | ICD-10-CM

## 2024-09-29 DIAGNOSIS — Z23 Encounter for immunization: Secondary | ICD-10-CM | POA: Insufficient documentation

## 2024-09-29 DIAGNOSIS — E785 Hyperlipidemia, unspecified: Secondary | ICD-10-CM | POA: Insufficient documentation

## 2024-09-29 LAB — LIPID PANEL
Cholesterol: 209 mg/dL (ref 0–239)
HIGH DENSITY LIPOPROTEIN: 54 mg/dL (ref 40–60)
LOW DENSITY LIPOPROTEIN DIRECT: 137 mg/dL (ref 0–189)
TRIGLYCERIDES: 193 mg/dL — ABNORMAL HIGH (ref 0–150)

## 2024-09-29 LAB — COMPREHENSIVE METABOLIC PANEL
ALANINE AMINOTRANSFERASE: 23 U/L (ref 12–45)
ALBUMIN: 4.6 g/dL (ref 3.4–5.2)
ALKALINE PHOSPHATASE: 113 U/L (ref 45–117)
ANION GAP: 12 mmol/L (ref 10–22)
ASPARTATE AMINOTRANSFERASE: 20 U/L (ref 8–34)
BILIRUBIN TOTAL: 0.2 mg/dL (ref 0.2–1.0)
BUN (UREA NITROGEN): 17 mg/dL (ref 7–18)
CALCIUM: 9.8 mg/dL (ref 8.5–10.5)
CARBON DIOXIDE: 25 mmol/L (ref 21–32)
CHLORIDE: 103 mmol/L (ref 98–107)
CREATININE: 0.8 mg/dL (ref 0.4–1.2)
ESTIMATED GLOMERULAR FILT RATE: >60 mL/min (ref >60)
Glucose Random: 89 mg/dL (ref 74–160)
POTASSIUM: 4.3 mmol/L (ref 3.5–5.1)
SODIUM: 139 mmol/L (ref 136–145)
TOTAL PROTEIN: 7.3 g/dL (ref 6.4–8.2)

## 2024-09-29 MED ORDER — ATORVASTATIN CALCIUM 20 MG PO TABS
20.0000 mg | ORAL_TABLET | Freq: Every day | ORAL | 3 refills | Status: AC
Start: 2024-09-29 — End: 2025-09-29

## 2024-09-29 NOTE — Progress Notes (Signed)
 [facility]   Outpatient Progress Note    Beth Hudson is a 78 year old female seen on 09/29/24.  Patient presents with:  Follow Up: On toe nail     Subjective     Interpreter was not used for this visit.     History  #Onychomycosis, improving   -History of onychomycosis on great toenails bilaterally for past two years  -Began taking terbinafine  in September; taken daily for last two months  -Noticed normal nail growth on right great toe a few weeks ago  -States that left great toe is unchanged   -No associated pain   -Youngest daughter is able to clip off the nails as they grow    #Hypothyroidism  -History of thyroid  disease in family   -Currently taking levothyroxine      #Sciatica R side, resolved   -Took gabapentin  100 mg TID for 14 days   -No more pain     #Hyperlipidemia   -Prescribed atorvastatin  two years ago  -Not currently taking atorvastatin       History reviewed and updated as appropriate.       Objective     Exam:   weight is 64.4 kg (142 lb). Her temporal temperature is 97.2 F (36.2 C). Her blood pressure is 126/70 and her pulse is 86. Her oxygen saturation is 98%.     Physical Exam  Constitutional:       General: She is not in acute distress.     Appearance: She is not ill-appearing.   HENT:      Head: Normocephalic and atraumatic.   Cardiovascular:      Rate and Rhythm: Normal rate and regular rhythm.      Heart sounds: No murmur heard.     No friction rub. No gallop.   Pulmonary:      Effort: Pulmonary effort is normal. No respiratory distress.      Breath sounds: Normal breath sounds and air entry. No wheezing, rhonchi or rales.   Musculoskeletal:        Feet:    Feet:      Right foot:      Toenail Condition: Right toenails are abnormally thick. Fungal disease present.     Left foot:      Toenail Condition: Left toenails are abnormally thick. Fungal disease present.     Comments: No pain on palpation of great toes bilaterally. Ungual hematoma noted on right toenail. Normal nail  growth seen bilaterally, to a greater extent on right side compared to left. Review images for comparison of onychomycosis on 08/05/2024 and 09/29/2024.   Skin:     General: Skin is warm.   Neurological:      Mental Status: She is alert.     Data review:  -Fungal culture on 08/05/2024 negative for fungus after 30 days.   -TSH is normal at 1.95 on 03/01/2024    The 10-year ASCVD risk score (Arnett DK, et al., 2019) is: 18.9%    Values used to calculate the score:      Age: 61 years      Sex: Female      Is Non-Hispanic African American: No      Diabetic: No      Tobacco smoker: No      Systolic Blood Pressure: 126 mmHg      Is BP treated: No      HDL Cholesterol: 47 mg/dL      Total Cholesterol: 205 mg/dL  Assessment & Plan  Beth Hudson is a 78 year old female with PMHx of hypothyroidism and hyperlipidemia presenting to clinic for follow-up for bilateral onychomycosis on the great toes.     1. Onychomycosis, improving   Patient notes new, normal nail growth on right great toe. On physical exam, normal nail growth seen on left great toe, as well. Told patient that normal growth is reassuring and that full normal toenail may take up to one year.    - COMPREHENSIVE METABOLIC PANEL  - Repeat LFTs for monitoring s/p finished treatment with terbinafine      2. Hyperlipidemia, unspecified hyperlipidemia type  Patient has a 10-year ASCVD risk score of 18.9%. Patient is due for a lipid panel, which can help guide dose for atorvastatin . Patient will be notified of medication dosing and prescription filling once lipid levels are back.   - LIPID PANEL    3. Need for prophylactic vaccination and inoculation against influenza  - IMMUNIZATION ADMIN SINGLE  - INFLUENZA VACCINE HIGH DOSE FOR 65 AND OLDER, IM    Follow-up: 3 month(s) for follow up on lipid and LFT panel    Ethan Medin  [facility] MS3  09/29/24

## 2024-09-29 NOTE — Progress Notes (Signed)
 Resident attestation:  Agree with medical student note as below. Completed independent assessment of patient.    78 year old female here for f/u onychomycosis. Completed 12 weeks of oral terbinafine . Some normal new nail growth proximally present. Provided reassurance and education on normal course of nail regrowth. Daughter able to clip nails. No new symptoms. Will check liver function today.  HLD - stopped statin when rx ran out. Counseled to restart. Will check lipids today.  Flu shot today  Hx sciatica - stopped gabapentin  when rx ran out, improved, does not want to restart  F/u with PCP in 3 months

## 2024-09-29 NOTE — Progress Notes (Signed)
 Preceptor Note  I personally reviewed this case, along with the patient's history and exam findings, with Dr. Cleatus and HILARIO Medin MS3. I confirm the findings, and agree with the assessment and plan, as documented in the visit note.  78 yo F with onychomycosis.  Vernell Lais MD

## 2024-09-29 NOTE — Progress Notes (Signed)
 09/29/2024  VIS given prior to administration and reviewed with the patient and or legal guardian. Patient understands the disease and the vaccine. See immunization/Injection module or chart review for date of publication and additional information.  Garnette Primrose, RN    Influenza Vaccine Procedure  September 29, 2024    1. Has the patient received the information for the influenza vaccine?  Yes    2. Does the patient have any of the following contraindications?  Allergy to eggs? No  Allergic reaction to previous influenza vaccines? No  Any other problems to previous influenza vaccines? No  Paralyzed by Guillain-Barre syndrome?  No  Currently pregnant? No  Current moderate or severe illness? No  Allergy to contact lens solution? No    3. The vaccine has been administered in the usual fashion and the patient/guardian was instructed to wait 20 minutes before leaving the building in the event of an allergic reaction:     Immunization information and current VIS for flu vaccine(s) reviewed; verbal consent given by patient/guardian.

## 2024-10-01 ENCOUNTER — Telehealth (HOSPITAL_BASED_OUTPATIENT_CLINIC_OR_DEPARTMENT_OTHER): Payer: Self-pay | Admitting: Ambulatory Care

## 2024-10-01 NOTE — Telephone Encounter (Signed)
 Spoke with the patient    Name and dob verified    Lab results reviewed with the patient and treatment    Pt verbalized understanding

## 2024-10-01 NOTE — Telephone Encounter (Signed)
 Cleatus Doffing, MD to [facility]  (Selected Message)  LD      09/29/24  7:29 PM  Dear RN,     Please:    1. Create Telephone encounter for this patient.  2. Share with the patient the following abnormal results: cholesterol     Plan:  1. Cholesterol is mildly elevated, but given other risk factors we recommend restarting the atorvastatin . But she can start at a lower dose. New 20mg  atorvastatin  daily is sent to preferred pharmacy. Other labs normal.     2. Type of Outreach: 1 phone call and if unable to reach send letter     3. Document the conversation in the Telephone Encounter and close the encounter, no need to send back to me.     Thank you,  Doffing Cleatus, MD

## 2024-12-21 ENCOUNTER — Ambulatory Visit: Attending: Internal Medicine

## 2024-12-21 VITALS — BP 129/73 | HR 84 | Temp 97.0°F | Wt 136.0 lb

## 2024-12-21 DIAGNOSIS — B351 Tinea unguium: Secondary | ICD-10-CM | POA: Diagnosis not present

## 2024-12-21 DIAGNOSIS — R42 Dizziness and giddiness: Secondary | ICD-10-CM | POA: Insufficient documentation

## 2024-12-21 DIAGNOSIS — R030 Elevated blood-pressure reading, without diagnosis of hypertension: Secondary | ICD-10-CM | POA: Diagnosis not present

## 2024-12-21 LAB — CBC WITH PLATELET
ABSOLUTE NRBC COUNT: 0 10*3/uL (ref 0.0–0.0)
HEMATOCRIT: 38.2 % (ref 34.1–44.9)
HEMOGLOBIN: 12 g/dL (ref 11.2–15.7)
MEAN CORP HGB CONC: 31.4 g/dL (ref 31.0–37.0)
MEAN CORPUSCULAR HGB: 27.1 pg (ref 26.0–34.0)
MEAN CORPUSCULAR VOL: 86.2 fl (ref 80.0–100.0)
MEAN PLATELET VOLUME: 9.8 fL (ref 8.7–12.5)
NRBC %: 0 % (ref 0.0–0.0)
PLATELET COUNT: 304 10*3/uL (ref 150–400)
RBC DISTRIBUTION WIDTH STD DEV: 42.5 fL (ref 35.1–46.3)
RED BLOOD CELL COUNT: 4.43 M/uL (ref 3.90–5.20)
WHITE BLOOD CELL COUNT: 7.8 10*3/uL (ref 4.0–11.0)

## 2024-12-21 LAB — THYROID SCREEN TSH REFLEX FT4: THYROID SCREEN TSH REFLEX FT4: 12.1 u[IU]/mL — ABNORMAL HIGH (ref 0.270–4.200)

## 2024-12-21 LAB — FERRITIN: FERRITIN: 23 ng/mL (ref 13–150)

## 2024-12-21 LAB — BASIC METABOLIC PANEL
ANION GAP: 11 mmol/L (ref 10–22)
BUN (UREA NITROGEN): 18 mg/dL (ref 7–18)
CALCIUM: 9.4 mg/dL (ref 8.5–10.5)
CARBON DIOXIDE: 25 mmol/L (ref 21–32)
CHLORIDE: 105 mmol/L (ref 98–107)
CREATININE: 1 mg/dL (ref 0.4–1.2)
ESTIMATED GLOMERULAR FILT RATE: 58 mL/min — ABNORMAL LOW (ref >60)
Glucose Random: 99 mg/dL (ref 74–160)
POTASSIUM: 3.5 mmol/L (ref 3.5–5.1)
SODIUM: 142 mmol/L (ref 136–145)

## 2024-12-21 LAB — HEMOGLOBIN A1C
ESTIMATED AVERAGE GLUCOSE: 123 mg/dL (ref 74–160)
HEMOGLOBIN A1C: 5.9 % — ABNORMAL HIGH (ref 4.0–5.6)

## 2024-12-21 LAB — IRON: IRON: 61 ug/dL (ref 50–170)

## 2024-12-21 LAB — TOTAL IRON BINDING CAPACITY: TOTAL IRON BIND CAPACITY CALC: 413 ug/dL (ref 280–504)

## 2024-12-21 LAB — FREE THYROXINE: FREE THYROXINE: 0.98 ng/dL (ref 0.92–1.68)

## 2024-12-21 NOTE — Progress Notes (Signed)
 [facility]  Office Visit Note   Patient presents with:  Follow Up: Dizziness, Rt foot big  toenail fall off    Subjective:   Beth Hudson is a 79 year old female patient who presents today for     #Onychomycosis  - s/p 12 weeks oral terbinifine  - R toenail fell off, growing back normally  - L toenail still with fungus, but normal nail towards the base    #Elevated BP  - told prevuously have elevated BP-  - has not taken BP at home  - Rx cuff, does not have      #Dizziness  - taking daughter's meclazine  - intermittent  - 2x/week  - lasts for a few minutes  - no clear trigger  - no associated HA  - no hearing changes  - vision goes blurry  - maybe trigger on days hasn't eaten a lot      ROS: see HPI     Medications, Allergies, FHx and Social Hx reviewed in Epic and current.    Problem List:   Patient Active Problem List:     Unspecified hypothyroidism     Epistaxis     Carpal tunnel syndrome     Disorder of bone and cartilage, unspecified     Family health problem     Second hand smoke exposure     Lichen planus      [facility] [phone] Option #4     Colon polyps, adenomatous     Angiodysplasia of cecum     Acute pain of both shoulders     Arthritis     Carotid artery stenosis     Cataract     Stress incontinence of urine     Hyperglycemia     Hyperlipidemia     Right facial pain     Syncope     Incontinence of feces     Elevated BP without diagnosis of hypertension     Chronic left-sided low back pain with left-sided sciatica     Iron  deficiency anemia      Objective:   BP 129/73   Pulse 84   Temp 97 F (36.1 C) (Temporal)   Wt 61.7 kg (136 lb)   SpO2 96%   General: alert, appears stated age, and cooperative  HEENT: PERRL, EOMI. Conjunctiva clear.  No icterus.  TMs clear bilateral - no erythema or effusion.  Oropharynx without erythema, edema or exudate.   Cardio: regular rate and rhythm, S1, S2 normal. No murmur    Pulmonary: clear to auscultation, no wheezing, rales  or rhonchi.   Ext: no cyanosis or edema    Skin: skin color, texture, turgor are normal, No rashes or lesions. R toenail with normal nail growth. L toenail with evidence of onychomycosis of the proximal nail with normal distal nail. See photo in chart.  Psych: Interactive, Normal Mood   Neuro: Alert and oriented X 3. Strength grossly intact. Normal coordination and gait.  Gross motor normal. Cranial nerves 2-6; EOMs intact, visual fields intact, no lid lag. Cranial nerves 5&7; facial sensation intact, facial expressions intact, no facial droop appreciated. Cranial nerve 9; hearing intact bilaterally. Cranial nerves 9, 10, 12; uvula and tongue midline. Cranial nerve 11; shoulder shrug test intact.       Assessment & Plan:      1. Dizziness  Intermittent brief dizziness with vision blurring and spontaneous resolution. Given possible trigger of low PO intake prior, suspect  vasovagal. Can also consider BPPV given brief episodes and potential response to meclazine (though may represent spontaneous resolution vs true medication response). Neurologic exam today benign. No hearing changes concerning for labrythitis. Low concern for central cause given intermittent in nature and benign neuro exam. Counseled on increasing frequency of eating and hydration and assessing for response. Will complete lab work up as below as well.  - BASIC METABOLIC PANEL  - CBC WITH PLATELET  - THYROID  SCREEN TSH REFLEX FT4  - IRON   - TOTAL IRON  BINDING CAPACITY  - FERRITIN  - HEMOGLOBIN A1C    2. Onychomycosis  Treated with 12 weeks PO terbinafine . With improvement of R toenail, slow normal growth of L. If L does not continue improvement, can consider repeat course.  - Reasssess toe exam at next visit    3. Elevated BP without diagnosis of hypertension  History of elevated BP, at goal today. Plan for ambulatory monitoring at home.  - 2 week RN visit for cuff calibration  - Home monitoring with log  - F/u 1 month with home BP  log          _____________________    1. The patient indicates understanding of these issues and agrees with the plan.  2.  The patient is given an After Visit Summary sheet that lists all of their medications with directions, their allergies, orders placed during this encounter, immunization dates, and follow- up instructions.  3. I reviewed the patient's medical information and medical history   4.  I reconciled the patient's medication list and prepared and supplied needed refills.  5.  I have reviewed the past medical, family, and social history sections including the medications and allergies listed in the above medical record  Leita Solian, MD, 12/21/2024

## 2024-12-21 NOTE — Patient Instructions (Signed)
 Please do the following after your visit:        Appointment: 2 weeks RN for BP cuff calibration     Labs: main hallway

## 2024-12-21 NOTE — Progress Notes (Signed)
Preceptor Note  I personally reviewed this case, along with the patient's history and exam findings, with Dr. Duncan. I confirm the findings, and agree with the assessment and plan, as documented in the visit note.    Adelise Buswell P Kendrell Lottman, MD

## 2024-12-22 ENCOUNTER — Ambulatory Visit (HOSPITAL_BASED_OUTPATIENT_CLINIC_OR_DEPARTMENT_OTHER): Payer: Self-pay

## 2024-12-23 NOTE — Telephone Encounter (Signed)
-----   Message from Leita Solian sent at 12/22/2024  8:04 AM EST -----  Dear RN,    Please:   1. Create Telephone encounter for this patient.  2. Share with the patient the following abnormal results: TSH    Plan:  1. Please confirm patient is taking levothyroxine  and what dose. Please ask when she takes it (confirm that she is taking it in the AM 30 minutes before any food). Please ask how many doses she has missed in the past 2 weeks. Let her know her TSH was abnormal and her dose will likely need to be adjusted. Once I get these answers she'll be called back with dose adjustment intructions    2. Type of Outreach: 3 phone call and if unable to reach send letter    3. Route back to  me    Thank you,  Leita Solian, MD    ----- Message -----  From: Interface, Lab  Sent: 12/21/2024   6:59 PM EST  To: Leita Solian, MD

## 2024-12-23 NOTE — Telephone Encounter (Signed)
 Placed call to pt.  Left voicemail to please call back.  Chart routed to [facility] RN pool to try again tomorrow.

## 2024-12-24 ENCOUNTER — Telehealth (HOSPITAL_BASED_OUTPATIENT_CLINIC_OR_DEPARTMENT_OTHER): Payer: Self-pay

## 2024-12-24 NOTE — Telephone Encounter (Signed)
 2nd attempt call to pt to review test results and providers message.No answer. VM left with clinic number. Sent to RN pool for final call back.

## 2024-12-25 NOTE — Telephone Encounter (Signed)
 Called patient no answer, left message to recall office.  SAM, RN, 12/25/2024         Leita Solian, MD  12/22/2024  8:04 AM EST Back to Top      Dear RN,     Please:    1. Create Telephone encounter for this patient.  2. Share with the patient the following abnormal results: TSH     Plan:  1. Please confirm patient is taking levothyroxine  and what dose. Please ask when she takes it (confirm that she is taking it in the AM 30 minutes before any food). Please ask how many doses she has missed in the past 2 weeks. Let her know her TSH was abnormal and her dose will likely need to be adjusted. Once I get these answers she'll be called back with dose adjustment intructions     2. Type of Outreach: 3 phone call and if unable to reach send letter     3. Route back to  me     Thank you,  Leita Solian, MD

## 2024-12-27 NOTE — Telephone Encounter (Signed)
 Returned call back to the patient    Name and dob verified    She confirmed taking levothyroxine  75mcg daily with no missed doses    Preferred pharmacy to send new rx:   [id number] - [place], [place] - [address] [[id number]]

## 2024-12-28 ENCOUNTER — Other Ambulatory Visit (HOSPITAL_BASED_OUTPATIENT_CLINIC_OR_DEPARTMENT_OTHER): Payer: Self-pay

## 2024-12-28 DIAGNOSIS — E038 Other specified hypothyroidism: Secondary | ICD-10-CM

## 2024-12-28 MED ORDER — LEVOTHYROXINE SODIUM 100 MCG PO TABS: 100.0000 ug | ORAL_TABLET | Freq: Every morning | ORAL | 3 refills | Status: AC

## 2025-01-04 ENCOUNTER — Ambulatory Visit

## 2025-03-08 ENCOUNTER — Ambulatory Visit
